# Patient Record
Sex: Female | Born: 1935 | Race: White | Hispanic: No | Marital: Married | Smoking: Never smoker
Health system: Southern US, Community
[De-identification: ages and names within clinical notes are randomized; demographics above are authoritative.]

## PROBLEM LIST (undated history)

## (undated) DIAGNOSIS — I350 Nonrheumatic aortic (valve) stenosis: Secondary | ICD-10-CM

## (undated) DIAGNOSIS — R5381 Other malaise: Secondary | ICD-10-CM

## (undated) DIAGNOSIS — E46 Unspecified protein-calorie malnutrition: Secondary | ICD-10-CM

## (undated) DIAGNOSIS — K5790 Diverticulosis of intestine, part unspecified, without perforation or abscess without bleeding: Secondary | ICD-10-CM

## (undated) DIAGNOSIS — N200 Calculus of kidney: Secondary | ICD-10-CM

## (undated) DIAGNOSIS — G459 Transient cerebral ischemic attack, unspecified: Secondary | ICD-10-CM

## (undated) DIAGNOSIS — I712 Thoracic aortic aneurysm, without rupture, unspecified: Secondary | ICD-10-CM

## (undated) DIAGNOSIS — I639 Cerebral infarction, unspecified: Secondary | ICD-10-CM

## (undated) DIAGNOSIS — I10 Essential (primary) hypertension: Secondary | ICD-10-CM

## (undated) DIAGNOSIS — C4491 Basal cell carcinoma of skin, unspecified: Secondary | ICD-10-CM

## (undated) DIAGNOSIS — J449 Chronic obstructive pulmonary disease, unspecified: Secondary | ICD-10-CM

## (undated) DIAGNOSIS — H532 Diplopia: Secondary | ICD-10-CM

## (undated) DIAGNOSIS — D519 Vitamin B12 deficiency anemia, unspecified: Secondary | ICD-10-CM

## (undated) DIAGNOSIS — I5022 Chronic systolic (congestive) heart failure: Secondary | ICD-10-CM

## (undated) DIAGNOSIS — K279 Peptic ulcer, site unspecified, unspecified as acute or chronic, without hemorrhage or perforation: Secondary | ICD-10-CM

## (undated) HISTORY — DX: Chronic obstructive pulmonary disease, unspecified: J44.9

## (undated) HISTORY — DX: Thoracic aortic aneurysm, without rupture, unspecified: I71.20

## (undated) HISTORY — DX: Calculus of kidney: N20.0

## (undated) HISTORY — PX: PARTIAL HYSTERECTOMY: SHX80

## (undated) HISTORY — PX: TUBAL LIGATION: SHX77

## (undated) HISTORY — DX: Basal cell carcinoma of skin, unspecified: C44.91

## (undated) HISTORY — DX: Cerebral infarction, unspecified: I63.9

## (undated) HISTORY — DX: Diverticulosis of intestine, part unspecified, without perforation or abscess without bleeding: K57.90

## (undated) HISTORY — DX: Vitamin B12 deficiency anemia, unspecified: D51.9

## (undated) HISTORY — PX: BASAL CELL CARCINOMA EXCISION: SHX1214

## (undated) HISTORY — DX: Essential (primary) hypertension: I10

## (undated) HISTORY — DX: Transient cerebral ischemic attack, unspecified: G45.9

## (undated) HISTORY — DX: Thoracic aortic aneurysm, without rupture: I71.2

## (undated) HISTORY — PX: HERNIA REPAIR: SHX51

## (undated) HISTORY — DX: Peptic ulcer, site unspecified, unspecified as acute or chronic, without hemorrhage or perforation: K27.9

## (undated) HISTORY — DX: Diplopia: H53.2

---

## 1966-10-17 HISTORY — PX: STOMACH SURGERY: SHX791

## 1984-10-17 HISTORY — PX: BACK SURGERY: SHX140

## 1986-10-17 DIAGNOSIS — K5730 Diverticulosis of large intestine without perforation or abscess without bleeding: Secondary | ICD-10-CM | POA: Insufficient documentation

## 1996-04-15 ENCOUNTER — Encounter (INDEPENDENT_AMBULATORY_CARE_PROVIDER_SITE_OTHER): Payer: Self-pay | Admitting: Internal Medicine

## 1996-04-15 LAB — CONVERTED CEMR LAB
RBC count: 4.06 10*6/uL
WBC, blood: 7.9 10*3/uL

## 1999-01-05 ENCOUNTER — Encounter (INDEPENDENT_AMBULATORY_CARE_PROVIDER_SITE_OTHER): Payer: Self-pay | Admitting: Internal Medicine

## 1999-01-05 LAB — CONVERTED CEMR LAB: RBC count: 3.59 10*6/uL

## 1999-01-11 ENCOUNTER — Encounter (INDEPENDENT_AMBULATORY_CARE_PROVIDER_SITE_OTHER): Payer: Self-pay | Admitting: Internal Medicine

## 1999-01-11 LAB — CONVERTED CEMR LAB: WBC, blood: 9.9 10*3/uL

## 1999-01-15 ENCOUNTER — Encounter (HOSPITAL_COMMUNITY): Admission: RE | Admit: 1999-01-15 | Discharge: 1999-04-15 | Payer: Self-pay | Admitting: Gastroenterology

## 1999-02-12 ENCOUNTER — Encounter (INDEPENDENT_AMBULATORY_CARE_PROVIDER_SITE_OTHER): Payer: Self-pay | Admitting: Internal Medicine

## 2000-08-29 ENCOUNTER — Encounter: Payer: Self-pay | Admitting: Internal Medicine

## 2000-08-29 ENCOUNTER — Encounter: Admission: RE | Admit: 2000-08-29 | Discharge: 2000-08-29 | Payer: Self-pay | Admitting: Internal Medicine

## 2000-09-04 ENCOUNTER — Encounter (INDEPENDENT_AMBULATORY_CARE_PROVIDER_SITE_OTHER): Payer: Self-pay | Admitting: Internal Medicine

## 2000-09-04 LAB — CONVERTED CEMR LAB
RBC count: 3.7 10*6/uL
WBC, blood: 6.2 10*3/uL

## 2000-09-27 LAB — FECAL OCCULT BLOOD, GUAIAC: Fecal Occult Blood: NEGATIVE

## 2000-12-04 ENCOUNTER — Encounter (INDEPENDENT_AMBULATORY_CARE_PROVIDER_SITE_OTHER): Payer: Self-pay | Admitting: Internal Medicine

## 2000-12-04 LAB — CONVERTED CEMR LAB: RBC count: 4.39 10*6/uL

## 2001-01-24 ENCOUNTER — Encounter (INDEPENDENT_AMBULATORY_CARE_PROVIDER_SITE_OTHER): Payer: Self-pay | Admitting: Internal Medicine

## 2001-01-24 LAB — CONVERTED CEMR LAB
RBC count: 4.39 10*6/uL
WBC, blood: 6.9 10*3/uL

## 2001-02-08 ENCOUNTER — Encounter (INDEPENDENT_AMBULATORY_CARE_PROVIDER_SITE_OTHER): Payer: Self-pay | Admitting: Internal Medicine

## 2001-02-12 ENCOUNTER — Encounter (INDEPENDENT_AMBULATORY_CARE_PROVIDER_SITE_OTHER): Payer: Self-pay | Admitting: Internal Medicine

## 2001-02-12 LAB — CONVERTED CEMR LAB: WBC, blood: 9.1 10*3/uL

## 2002-02-27 ENCOUNTER — Encounter (INDEPENDENT_AMBULATORY_CARE_PROVIDER_SITE_OTHER): Payer: Self-pay | Admitting: Internal Medicine

## 2002-02-27 LAB — CONVERTED CEMR LAB: RBC count: 4.41 10*6/uL

## 2002-10-17 DIAGNOSIS — J449 Chronic obstructive pulmonary disease, unspecified: Secondary | ICD-10-CM

## 2002-10-17 DIAGNOSIS — J4489 Other specified chronic obstructive pulmonary disease: Secondary | ICD-10-CM | POA: Insufficient documentation

## 2002-10-21 ENCOUNTER — Inpatient Hospital Stay (HOSPITAL_COMMUNITY): Admission: EM | Admit: 2002-10-21 | Discharge: 2002-10-23 | Payer: Self-pay | Admitting: Emergency Medicine

## 2002-10-21 ENCOUNTER — Encounter: Payer: Self-pay | Admitting: *Deleted

## 2002-10-21 HISTORY — PX: OTHER SURGICAL HISTORY: SHX169

## 2002-10-22 ENCOUNTER — Encounter: Payer: Self-pay | Admitting: Cardiology

## 2002-10-23 ENCOUNTER — Encounter: Payer: Self-pay | Admitting: Cardiology

## 2003-11-24 ENCOUNTER — Encounter (INDEPENDENT_AMBULATORY_CARE_PROVIDER_SITE_OTHER): Payer: Self-pay | Admitting: Internal Medicine

## 2003-11-24 LAB — CONVERTED CEMR LAB
Blood Glucose, Fasting: 100 mg/dL
RBC count: 4.61 10*6/uL
WBC, blood: 7.6 10*3/uL

## 2004-12-13 ENCOUNTER — Ambulatory Visit: Payer: Self-pay | Admitting: Cardiology

## 2005-06-29 ENCOUNTER — Ambulatory Visit: Payer: Self-pay | Admitting: Cardiology

## 2005-12-21 ENCOUNTER — Ambulatory Visit: Payer: Self-pay | Admitting: Cardiology

## 2006-06-22 ENCOUNTER — Ambulatory Visit: Payer: Self-pay | Admitting: Cardiology

## 2006-07-04 ENCOUNTER — Ambulatory Visit: Payer: Self-pay | Admitting: Family Medicine

## 2006-07-04 ENCOUNTER — Encounter (INDEPENDENT_AMBULATORY_CARE_PROVIDER_SITE_OTHER): Payer: Self-pay | Admitting: Internal Medicine

## 2006-07-04 LAB — CONVERTED CEMR LAB
RBC count: 4.1 10*6/uL
WBC, blood: 7 10*3/uL

## 2006-08-15 ENCOUNTER — Ambulatory Visit: Payer: Self-pay | Admitting: Family Medicine

## 2006-09-08 ENCOUNTER — Ambulatory Visit: Payer: Self-pay | Admitting: Family Medicine

## 2006-09-21 ENCOUNTER — Ambulatory Visit: Payer: Self-pay | Admitting: Family Medicine

## 2006-12-20 ENCOUNTER — Ambulatory Visit: Payer: Self-pay | Admitting: Cardiology

## 2007-05-25 ENCOUNTER — Ambulatory Visit: Payer: Self-pay | Admitting: Family Medicine

## 2007-05-25 ENCOUNTER — Observation Stay (HOSPITAL_COMMUNITY): Admission: EM | Admit: 2007-05-25 | Discharge: 2007-05-27 | Payer: Self-pay | Admitting: Emergency Medicine

## 2007-05-25 DIAGNOSIS — R0989 Other specified symptoms and signs involving the circulatory and respiratory systems: Secondary | ICD-10-CM

## 2007-05-25 DIAGNOSIS — H532 Diplopia: Secondary | ICD-10-CM

## 2007-05-26 ENCOUNTER — Encounter: Payer: Self-pay | Admitting: Internal Medicine

## 2007-05-27 ENCOUNTER — Encounter (INDEPENDENT_AMBULATORY_CARE_PROVIDER_SITE_OTHER): Payer: Self-pay | Admitting: Internal Medicine

## 2007-05-28 ENCOUNTER — Telehealth (INDEPENDENT_AMBULATORY_CARE_PROVIDER_SITE_OTHER): Payer: Self-pay | Admitting: Internal Medicine

## 2007-05-29 ENCOUNTER — Ambulatory Visit: Payer: Self-pay

## 2007-05-29 ENCOUNTER — Encounter (INDEPENDENT_AMBULATORY_CARE_PROVIDER_SITE_OTHER): Payer: Self-pay | Admitting: Internal Medicine

## 2007-06-01 ENCOUNTER — Ambulatory Visit: Payer: Self-pay | Admitting: Family Medicine

## 2007-06-01 DIAGNOSIS — G459 Transient cerebral ischemic attack, unspecified: Secondary | ICD-10-CM

## 2007-06-01 HISTORY — DX: Transient cerebral ischemic attack, unspecified: G45.9

## 2007-07-06 ENCOUNTER — Ambulatory Visit: Payer: Self-pay | Admitting: Cardiology

## 2008-07-16 ENCOUNTER — Ambulatory Visit: Payer: Self-pay | Admitting: Cardiology

## 2008-09-29 ENCOUNTER — Telehealth: Payer: Self-pay | Admitting: Family Medicine

## 2008-09-29 ENCOUNTER — Ambulatory Visit: Payer: Self-pay | Admitting: Family Medicine

## 2008-09-29 DIAGNOSIS — L03319 Cellulitis of trunk, unspecified: Secondary | ICD-10-CM

## 2008-09-29 DIAGNOSIS — L02219 Cutaneous abscess of trunk, unspecified: Secondary | ICD-10-CM

## 2008-09-30 ENCOUNTER — Encounter: Payer: Self-pay | Admitting: Family Medicine

## 2008-10-01 ENCOUNTER — Ambulatory Visit: Payer: Self-pay | Admitting: Family Medicine

## 2008-10-17 DIAGNOSIS — G459 Transient cerebral ischemic attack, unspecified: Secondary | ICD-10-CM

## 2008-10-17 HISTORY — DX: Transient cerebral ischemic attack, unspecified: G45.9

## 2008-11-04 ENCOUNTER — Ambulatory Visit: Payer: Self-pay | Admitting: Internal Medicine

## 2008-11-04 DIAGNOSIS — D518 Other vitamin B12 deficiency anemias: Secondary | ICD-10-CM

## 2008-11-04 DIAGNOSIS — R42 Dizziness and giddiness: Secondary | ICD-10-CM

## 2008-11-04 DIAGNOSIS — D509 Iron deficiency anemia, unspecified: Secondary | ICD-10-CM | POA: Insufficient documentation

## 2008-11-07 ENCOUNTER — Ambulatory Visit: Payer: Self-pay | Admitting: Oncology

## 2008-11-07 ENCOUNTER — Encounter: Payer: Self-pay | Admitting: Internal Medicine

## 2008-11-07 ENCOUNTER — Telehealth (INDEPENDENT_AMBULATORY_CARE_PROVIDER_SITE_OTHER): Payer: Self-pay | Admitting: Internal Medicine

## 2008-11-07 DIAGNOSIS — Z87442 Personal history of urinary calculi: Secondary | ICD-10-CM

## 2008-11-07 DIAGNOSIS — Z85828 Personal history of other malignant neoplasm of skin: Secondary | ICD-10-CM | POA: Insufficient documentation

## 2008-11-07 LAB — CONVERTED CEMR LAB
Albumin: 3.1 g/dL — ABNORMAL LOW (ref 3.5–5.2)
Alkaline Phosphatase: 54 units/L (ref 39–117)
Basophils Absolute: 0 10*3/uL (ref 0.0–0.1)
Bilirubin, Direct: 0.1 mg/dL (ref 0.0–0.3)
Cholesterol: 182 mg/dL (ref 0–200)
Folate: 6.7 ng/mL
HDL: 53.4 mg/dL (ref >39.0)
LDL Cholesterol: 108 mg/dL — ABNORMAL HIGH (ref 0–99)
Lymphocytes Relative: 14.7 % (ref 12.0–46.0)
MCHC: 33.2 g/dL (ref 30.0–36.0)
Monocytes Relative: 7.7 % (ref 3.0–12.0)
Platelets: 250 10*3/uL (ref 150–400)
RDW: 18.1 % — ABNORMAL HIGH (ref 11.5–14.6)
Total CHOL/HDL Ratio: 3.4
Total Protein: 6.4 g/dL (ref 6.0–8.3)
Triglycerides: 104 mg/dL (ref 0–149)
VLDL: 21 mg/dL (ref 0–40)
Vitamin B-12: 1198 pg/mL — ABNORMAL HIGH (ref 211–911)

## 2008-11-12 ENCOUNTER — Encounter: Payer: Self-pay | Admitting: Family Medicine

## 2008-11-12 LAB — CBC WITH DIFFERENTIAL (CANCER CENTER ONLY)
BASO#: 0 10*3/uL (ref 0.0–0.2)
EOS%: 2.9 % (ref 0.0–7.0)
HCT: 34.6 % — ABNORMAL LOW (ref 34.8–46.6)
HGB: 11.1 g/dL — ABNORMAL LOW (ref 11.6–15.9)
LYMPH#: 1.1 10*3/uL (ref 0.9–3.3)
LYMPH%: 16 % (ref 14.0–48.0)
MCH: 26.3 pg (ref 26.0–34.0)
MCHC: 32.2 g/dL (ref 32.0–36.0)
MCV: 82 fL (ref 81–101)
MONO%: 4.1 % (ref 0.0–13.0)
NEUT%: 76.4 % (ref 39.6–80.0)

## 2008-11-12 LAB — CMP (CANCER CENTER ONLY)
ALT(SGPT): 10 U/L (ref 10–47)
CO2: 29 mEq/L (ref 18–33)
Calcium: 9.2 mg/dL (ref 8.0–10.3)
Chloride: 98 mEq/L (ref 98–108)
Glucose, Bld: 113 mg/dL (ref 73–118)
Sodium: 143 mEq/L (ref 128–145)
Total Protein: 7.5 g/dL (ref 6.4–8.1)

## 2008-11-12 LAB — MORPHOLOGY - CHCC SATELLITE: Platelet Morphology: NORMAL

## 2008-11-12 LAB — TECHNOLOGIST REVIEW CHCC SATELLITE

## 2008-11-14 LAB — PROTEIN ELECTROPHORESIS, SERUM
Beta 2: 5.9 % (ref 3.2–6.5)
Gamma Globulin: 13.8 % (ref 11.1–18.8)

## 2008-11-14 LAB — IRON AND TIBC
%SAT: 8 % — ABNORMAL LOW (ref 20–55)
TIBC: 403 ug/dL (ref 250–470)
UIBC: 369 ug/dL

## 2008-11-14 LAB — FOLATE: Folate: 7.6 ng/mL

## 2008-11-14 LAB — ERYTHROPOIETIN: Erythropoietin: 44.7 m[IU]/mL — ABNORMAL HIGH (ref 2.6–34.0)

## 2008-12-02 ENCOUNTER — Encounter (INDEPENDENT_AMBULATORY_CARE_PROVIDER_SITE_OTHER): Payer: Self-pay | Admitting: Internal Medicine

## 2008-12-02 LAB — CBC WITH DIFFERENTIAL (CANCER CENTER ONLY)
BASO#: 0 10*3/uL (ref 0.0–0.2)
Eosinophils Absolute: 0.3 10*3/uL (ref 0.0–0.5)
HGB: 10.4 g/dL — ABNORMAL LOW (ref 11.6–15.9)
LYMPH#: 1.3 10*3/uL (ref 0.9–3.3)
MCH: 26.8 pg (ref 26.0–34.0)
MONO#: 0.4 10*3/uL (ref 0.1–0.9)
NEUT#: 4.1 10*3/uL (ref 1.5–6.5)
RBC: 3.89 10*6/uL (ref 3.70–5.32)
WBC: 6.1 10*3/uL (ref 3.9–10.0)

## 2008-12-18 ENCOUNTER — Ambulatory Visit: Payer: Self-pay | Admitting: Oncology

## 2009-01-12 DIAGNOSIS — I10 Essential (primary) hypertension: Secondary | ICD-10-CM

## 2009-01-12 DIAGNOSIS — I6350 Cerebral infarction due to unspecified occlusion or stenosis of unspecified cerebral artery: Secondary | ICD-10-CM | POA: Insufficient documentation

## 2009-01-12 DIAGNOSIS — I712 Thoracic aortic aneurysm, without rupture, unspecified: Secondary | ICD-10-CM | POA: Insufficient documentation

## 2009-01-13 ENCOUNTER — Encounter: Payer: Self-pay | Admitting: Family Medicine

## 2009-01-13 LAB — CBC WITH DIFFERENTIAL (CANCER CENTER ONLY)
BASO#: 0 10*3/uL (ref 0.0–0.2)
Eosinophils Absolute: 0.2 10*3/uL (ref 0.0–0.5)
HCT: 41.4 % (ref 34.8–46.6)
HGB: 13.7 g/dL (ref 11.6–15.9)
LYMPH#: 1.1 10*3/uL (ref 0.9–3.3)
LYMPH%: 21 % (ref 14.0–48.0)
MCV: 89 fL (ref 81–101)
MONO#: 0.2 10*3/uL (ref 0.1–0.9)
NEUT%: 71 % (ref 39.6–80.0)
WBC: 5 10*3/uL (ref 3.9–10.0)

## 2009-01-13 LAB — IRON AND TIBC
%SAT: 27 % (ref 20–55)
TIBC: 283 ug/dL (ref 250–470)
UIBC: 208 ug/dL

## 2009-04-13 ENCOUNTER — Ambulatory Visit: Payer: Self-pay | Admitting: Oncology

## 2009-04-15 ENCOUNTER — Encounter (INDEPENDENT_AMBULATORY_CARE_PROVIDER_SITE_OTHER): Payer: Self-pay | Admitting: Internal Medicine

## 2009-04-15 LAB — CBC WITH DIFFERENTIAL (CANCER CENTER ONLY)
BASO#: 0 10*3/uL (ref 0.0–0.2)
BASO%: 0.4 % (ref 0.0–2.0)
EOS%: 5 % (ref 0.0–7.0)
HGB: 13.3 g/dL (ref 11.6–15.9)
LYMPH#: 1 10*3/uL (ref 0.9–3.3)
MCHC: 35 g/dL (ref 32.0–36.0)
NEUT#: 3.7 10*3/uL (ref 1.5–6.5)

## 2009-04-15 LAB — IRON AND TIBC: Iron: 58 ug/dL (ref 42–145)

## 2009-04-15 LAB — FERRITIN: Ferritin: 171 ng/mL (ref 10–291)

## 2009-07-23 ENCOUNTER — Ambulatory Visit: Payer: Self-pay | Admitting: Cardiology

## 2009-07-24 ENCOUNTER — Encounter (INDEPENDENT_AMBULATORY_CARE_PROVIDER_SITE_OTHER): Payer: Self-pay | Admitting: *Deleted

## 2009-08-12 ENCOUNTER — Ambulatory Visit: Payer: Self-pay | Admitting: Oncology

## 2009-08-14 ENCOUNTER — Telehealth: Payer: Self-pay | Admitting: Cardiology

## 2009-08-17 ENCOUNTER — Encounter: Payer: Self-pay | Admitting: Family Medicine

## 2009-08-17 LAB — CBC WITH DIFFERENTIAL (CANCER CENTER ONLY)
BASO%: 0.3 % (ref 0.0–2.0)
EOS%: 4.8 % (ref 0.0–7.0)
HCT: 40.6 % (ref 34.8–46.6)
LYMPH#: 1.2 10*3/uL (ref 0.9–3.3)
MCHC: 33 g/dL (ref 32.0–36.0)
NEUT#: 3.5 10*3/uL (ref 1.5–6.5)
NEUT%: 66.8 % (ref 39.6–80.0)
RDW: 12.1 % (ref 10.5–14.6)

## 2009-08-18 LAB — IRON AND TIBC
%SAT: 25 % (ref 20–55)
TIBC: 249 ug/dL — ABNORMAL LOW (ref 250–470)
UIBC: 186 ug/dL

## 2009-08-21 ENCOUNTER — Encounter: Payer: Self-pay | Admitting: Family Medicine

## 2009-11-13 ENCOUNTER — Ambulatory Visit: Payer: Self-pay | Admitting: Oncology

## 2009-11-19 ENCOUNTER — Encounter: Payer: Self-pay | Admitting: Family Medicine

## 2009-11-19 LAB — CBC WITH DIFFERENTIAL (CANCER CENTER ONLY)
BASO%: 0.5 % (ref 0.0–2.0)
EOS%: 3 % (ref 0.0–7.0)
HCT: 39.3 % (ref 34.8–46.6)
LYMPH%: 15.4 % (ref 14.0–48.0)
MCH: 32 pg (ref 26.0–34.0)
MCHC: 33.2 g/dL (ref 32.0–36.0)
MCV: 96 fL (ref 81–101)
MONO#: 0.3 10*3/uL (ref 0.1–0.9)
MONO%: 5.5 % (ref 0.0–13.0)
NEUT%: 75.6 % (ref 39.6–80.0)
Platelets: 228 10*3/uL (ref 145–400)
RDW: 11.1 % (ref 10.5–14.6)

## 2009-11-19 LAB — FERRITIN: Ferritin: 166 ng/mL (ref 10–291)

## 2009-11-19 LAB — IRON AND TIBC: TIBC: 268 ug/dL (ref 250–470)

## 2009-11-20 ENCOUNTER — Telehealth: Payer: Self-pay | Admitting: Family Medicine

## 2010-07-30 ENCOUNTER — Ambulatory Visit: Payer: Self-pay | Admitting: Cardiology

## 2010-08-05 ENCOUNTER — Telehealth (INDEPENDENT_AMBULATORY_CARE_PROVIDER_SITE_OTHER): Payer: Self-pay | Admitting: *Deleted

## 2010-08-05 ENCOUNTER — Encounter: Payer: Self-pay | Admitting: Family Medicine

## 2010-11-12 ENCOUNTER — Telehealth: Payer: Self-pay | Admitting: Cardiology

## 2010-11-16 NOTE — Letter (Signed)
[facility]   Imported By: Lanelle Bal 11/30/2009 11:55:19  _____________________________________________________________________  External Attachment:    Type:   Image     Comment:   External Document

## 2010-11-16 NOTE — Miscellaneous (Signed)
Summary: flu vaccine  Clinical Lists Changes  Observations: Added new observation of FLU VAX: Historical at [facility] (07/29/2010 16:12)      Immunization History:  Influenza Immunization History:    Influenza:  historical at [facility] (07/29/2010)

## 2010-11-16 NOTE — Assessment & Plan Note (Signed)
Summary: [name] [name]   Visit Type:  1 year follow up  CC:  No complaints.  History of Present Illness: Patient is making quilts for the grandchildren.  Staying activity.  She denies any chest pain.  Not having any chest pain.  No shortness of breath.  She has not had a recent labwork.  She is comfortable with her decision making.  No real problems at the present time.    Current Medications (verified): 1)  Cyanocobalamin 1000 Mcg/ml Soln (Cyanocobalamin) .... 1,000 Micrograms Per Month 2)  Acetaminophen 325 Mg  Tabs (Acetaminophen) .... Take 1 Tablet By Mouth Once A Day As Needed 3)  Meclizine Hcl 25 Mg Tabs (Meclizine Hcl) .... Take One By Mouth As Needed 4)  Aspirin 81 Mg Tbec (Aspirin) .... Take Two Tablets By Mouth Daily 5)  Metoprolol Tartrate 50 Mg Tabs (Metoprolol Tartrate) .... Take 1/2 Tablet Two Times A Day  Allergies (verified): No Known Drug Allergies  Past History:  Past Medical History: Last updated: 01/12/2009 THORACIC AORTIC ANEURYSM (ICD-441.2) CVA (ICD-434.91) HYPERTENSION, UNSPECIFIED (ICD-401.9) DIVERTICULAR DISEASE (ICD-562.10) COPD (ICD-496) AORTIC ANEURYSM (ICD-441.9) CARCINOMA, BASAL CELL, HX OF (ICD-V10.83) RENAL CALCULUS, HX OF (ICD-V13.01) ANEMIA (ICD-285.9) DIZZINESS (ICD-780.4) ANEMIA, B12 DEFICIENCY (ICD-281.1) CELLULITIS AND ABSCESS OF TRUNK (ICD-682.2) TIA (ICD-435.9) CAROTID BRUIT, RIGHT (ICD-785.9) DIPLOPIA (ICD-368.2)    Past Surgical History: Last updated: 01/12/2009 2-D echo 8/08 STOMACH SURGERY X2 BILLROTH II--1968 ;VAGOTOMYSUBTOTAL GASTRIC//GASTRECTOMY:(1989) BACK SURGERY :(1986) HERNIA REPAIR tubal ligation PARTIAL HYSTERECTOMY FOR FIBROIDS Echo--mild LVD--EF 40-50% CHEST CT ASCENDING ANEURYSM ; 5.5 X 5.5 :(10/21/2002) UPPER GI WITH SMALL BOWEL FOLLOW THROUGH:(08/2000) CAROTID DOPPLER- NEGATIVE:(12/1998)  basal skin cancer removed  Family History: Last updated: 01/12/2009 Father: FATHER CVA AT AGE 67 Mother: died at age 37 of  cholecystitis Siblings:  MATERNAL AUNT FACIAL CANCER  CV: NEGATIVE HBP: NEGATIVE MI: NEGATIVE  Social History: Last updated: 11/07/2008 Retired Alcohol use-no Drug use-no Married and lives with her husband  Vital Signs:  Patient profile:   77 year old female Height:      63 inches Weight:      105.50 pounds BMI:     18.76 Pulse rate:   63 / minute Pulse rhythm:   regular Resp:     18 per minute BP sitting:   124 / 74  (left arm) Cuff size:   regular  Vitals Entered By: Vikki Ports (July 30, 2010 9:27 AM)  Physical Exam  General:  Well developed, well nourished, in no acute distress. Head:  normocephalic and atraumatic Eyes:  PERRLA/EOM intact; conjunctiva and lids normal. Lungs:  Clear bilaterally to auscultation and percussion. Heart:  PMI non displaced.  Normal S1 and S2.  SEM 2/6, 1/6 early diastolic blow.   Abdomen:  Bowel sounds positive; abdomen soft and non-tender without masses, organomegaly, or hernias noted. No hepatosplenomegaly. Pulses:  pulses normal in all 4 extremities Extremities:  No clubbing or cyanosis. Neurologic:  Alert and oriented x 3.   EKG  Procedure date:  07/30/2010  Findings:      NSR.  LVH.  No repole changes.   Impression & Recommendations:  Problem # 1:  THORACIC AORTIC ANEURYSM (ICD-441.2) BP remains stable. She has no interest in evaluation or treatment other than BP control. This has been discussed many times  Problem # 2:  HYPERTENSION, UNSPECIFIED (ICD-401.9)  controlled at present.  Her updated medication list for this problem includes:    Aspirin 81 Mg Tbec (Aspirin) .Marland Kitchen... Take two tablets by mouth daily    Metoprolol Tartrate  50 Mg Tabs (Metoprolol tartrate) .Marland Kitchen... Take 1/2 tablet two times a day  Her updated medication list for this problem includes:    Aspirin 81 Mg Tbec (Aspirin) .Marland Kitchen... Take two tablets by mouth daily    Metoprolol Tartrate 50 Mg Tabs (Metoprolol tartrate) .Marland Kitchen... Take 1/2 tablet two times a  day  Orders: EKG w/ Interpretation (93000)  Problem # 3:  ANEMIA (ICD-285.9) managed at [facility] clinic.  Patient Instructions: 1)  Your physician recommends that you continue on your current medications as directed. Please refer to the Current Medication list given to you today. 2)  Your physician wants you to follow-up in: 1 YEAR.  You will receive a reminder letter in the mail two months in advance. If you don't receive a letter, please call our office to schedule the follow-up appointment. Prescriptions: METOPROLOL TARTRATE 50 MG TABS (METOPROLOL TARTRATE) take 1/2 tablet two times a day  #90 x 3   Entered by:   Julieta Gutting, RN, BSN   Authorized by:   Ronaldo Miyamoto, MD, [facility]   Signed by:   Julieta Gutting, RN, BSN on 07/30/2010   Method used:   Electronically to        [facility] [address] (retail)       [address], [address]       [place], [place]  [address]       Ph: [phone]       Fax: [phone]   RxID:   [id number]

## 2010-11-16 NOTE — Progress Notes (Signed)
Summary: refill request/wants nurse call  Phone Note Refill Request Message from:  Patient on August 05, 2010 10:44 AM  Refills Requested: Medication #1:  METOPROLOL TARTRATE 50 MG TABS take 1/2 tablet two times a day. [facility] [address] in [place]/pt would like a call from the nurse as well-has questions   Method Requested: Telephone to Pharmacy Initial call taken by: Glynda Jaeger,  August 05, 2010 10:45 AM  Follow-up for Phone Call        Rx faxed to pharmacy. Vikki Ports  August 05, 2010 11:24 AM       Prescriptions: METOPROLOL TARTRATE 50 MG TABS (METOPROLOL TARTRATE) take 1/2 tablet two times a day  #90 x 3   Entered by:   Vikki Ports   Authorized by:   Ronaldo Miyamoto, MD, [facility]   Signed by:   Vikki Ports on 08/05/2010   Method used:   Faxed to ...       [facility] [address] (retail)       [address], [address]       [place], [place]  [address]       Ph: [phone]       Fax: [phone]   RxID:   [id number]   Appended Document: refill request/wants nurse call I spoke with the pharmacy and they did not receive Rx sent on 07/30/10.  Doree Fudge recent prescription this morning.  I called the pharmacy and they did receive Rx from today and have already filled Metoprolol.  I made the pt aware that medication is ready for pick-up.

## 2010-11-16 NOTE — Progress Notes (Signed)
Summary: Cyanocobalan refill  Phone Note Refill Request Call back at [phone] Message from:  Barrie Folk rd on November 20, 2009 5:04 PM  Refills Requested: Medication #1:  CYANOCOBALAMIN 1000 MCG/ML SOLN 1   Last Refilled: 11/06/2008 Refill request not filled since 11/06/08? Last seen on 11/04/08 by Everrett Coombe. Please advise.    Method Requested: Electronic Initial call taken by: Lewanda Rife LPN,  November 20, 2009 5:05 PM  Follow-up for Phone Call        Please have her scheduled a yearly CPX. Refill until appt date.  Follow-up by: Kerby Nora MD,  November 20, 2009 5:16 PM  Additional Follow-up for Phone Call Additional follow up Details #1::        Pt scheduled yearly CPX on 01/26/10 at 2:45pm and refill given on Vit B12 x3 as instructed. Lewanda Rife LPN  November 23, 2009 8:45 AM     Prescriptions: CYANOCOBALAMIN 1000 MCG/ML SOLN (CYANOCOBALAMIN) 1,000 micrograms per month  #58ml x 3   Entered by:   Lewanda Rife LPN   Authorized by:   Kerby Nora MD   Signed by:   Lewanda Rife LPN on 25/36/6440   Method used:   Electronically to        [facility] [address] (retail)       [address]       [place], [place]  [address]       Ph: [phone]       Fax: [phone]   RxID:   [id number]

## 2010-11-18 NOTE — Progress Notes (Signed)
Summary: Cataracts  Phone Note Call from Patient Call back at [phone]   Caller: Daughter/BETH Reason for Call: Talk to Nurse Summary of Call: PT DAUGHTER HAS QUESTION RE PT CONDITION. PT IS GOING TO HAVE SURGERY PT DAUGHTER HAS QUESTION RE PT HEART CONDITION. Initial call taken by: Roe Coombs,  November 12, 2010 11:26 AM  Follow-up for Phone Call        I spoke with the pt's daughter and she saw the optometrist at [facility] and they told her that she had cataracts.  They told her to contact [facility] to schedule appt.  I made Beth aware that the pt would need to see [facility] first and then let them know that Dr Riley Kill is her Cardiologist and they will request clearance.  Follow-up by: Julieta Gutting, RN, BSN,  November 12, 2010 3:49 PM

## 2011-03-01 NOTE — H&P (Signed)
NAME:  [name] [name], KASSING NO.:  [id number]   MEDICAL RECORD NO.:  [id number]          PATIENT TYPE:  EMS   LOCATION:  MAJO                         FACILITY:  [facility]   PHYSICIAN:  Thora Lance, M.D.  DATE OF BIRTH:  Jul 13, 1926   DATE OF ADMISSION:  05/25/2007  DATE OF DISCHARGE:                              HISTORY & PHYSICAL   CHIEF COMPLAINT:  Double vision.   HISTORY OF PRESENT ILLNESS:  This is a 77 year old white female with a  past medical history of mild LV dysfunction, kidney stones, and an  ascending thoracic aortic aneurysm who presents complaining that this  morning she woke up with double vision.  She reports that things that  are at a distance are seen double.  She has had a mild frontal headache.  She says she was a little bit off balance or weaving with her gait  today.  She denies any speech changes, or weakness or numbness in the  extremity.  She saw her ophthalmologist today who could not find any  problem with her eyes.  She then went to her primary care office at  [facility] and was sent to the ER for a CAT scan, which was  done and it showed some old lacunar infarcts in her internal capsules  bilaterally but no acute findings.   PAST MEDICAL HISTORY:  1. Mild LV dysfunction.  A 2D echocardiogram in 2004, EF 40-50% with      mild diffuse LV hypokinesis.  2. Ascending thoracic aortic aneurysm, 5.5 x 5.5-cm CAT scan in 2004.  3. PACs and PVCs.  4. B12 deficiency.  5. History of vertigo.  6. Renal stones.   SURGICAL:  1. Partial gastrectomy in 1968.  2. Back surgery.  3. Tonsillectomy and adenoidectomy.  4. BTL.   ALLERGIES:  No known drug allergies.   CURRENT MEDICATIONS:  Metoprolol 50 mg, one half b.i.d.   FAMILY HISTORY:  Father had a stroke late in life.   SOCIAL HISTORY:  Married, lives with husband, 2 daughters and 2 sons.  She does not smoke or drink alcohol.   PHYSICAL EXAMINATION:  GENERAL:  A very pleasant,  alert, elderly female.  VITAL SIGNS:  Blood pressure per ER records.   LABORATORY:  CBC:  WBC 7, hemoglobin 12, platelet count 284.  Creatinine  0.9, sodium 140, potassium 4.5, chloride 107, glucose 106, BUN 17,  bicarbonate 26.  EKG shows a normal sinus rhythm with a marked sinus  arrhythmia, nonspecific ST abnormality.  A CT scan of the head shows  bilateral internal capsule old lacunar infarcts, nothing acute.  Chest x-  ray is pending.   ASSESSMENT:  1. Diplopia, possible mild gait disturbance rule out cerebrovascular      accident.  2. Mild left ventricular dysfunction.  3. History of ascending thoracic aortic aneurysm.   PLAN:  1. Observation overnight.  2. MRI/MRA in the morning.  3. IV normal saline.  4. ASA.           ______________________________  Thora Lance, M.D.     Delorse Limber  D:  05/25/2007  T:  05/25/2007  Job:  [id number]

## 2011-03-01 NOTE — Assessment & Plan Note (Signed)
[facility]                            CARDIOLOGY OFFICE NOTE   NAME:[name] [name], [name] [name]                      MRN:          [id number]  DATE:07/06/2007                            DOB:          [birth year]    [name] [name] is in for followup.  She was admitted to the hospital with  diplopia and possible mild gait disturbance.  She has a history of left  ventricular dysfunction and ascending thoracic aneurysm for which she  virtually wants no treatment.  When she was admitted to the hospital, an MRI suggested some small  strokes.  They recommended switching her Plavix, but she felt poorly on  this and she went back on aspirin.  This is what she wants to take.  Her echocardiogram was done and demonstrated thoracic enlargement.  Her  ejection fraction was thought to be 55% inadequate for left ventricular  regional wall motion, mild to moderately increased LV thickness.  There  is mildly reduced aortic leaflet excursion and fusion of the left and  right coronary cusps.  There was thought to be mild aortic stenosis.   She has felt well since discharge from the hospital.   PHYSICAL EXAMINATION:  VITAL SIGNS:  The blood pressure is 120/76, pulse  is 66.  HEART:  There is a soft systolic ejection murmur, no diastolic murmurs  are appreciated.  EXTREMITIES:  Reveal no edema.   The EKG reveals sinus bradycardia.  There is possible left atrial  enlargement and septal infarct which is probably not reliable.  There is  an atrial premature beat.   IMPRESSION:  1. Ascending thoracic aneurysm for which the patient wants no      treatment.  2. Recent cerebrovascular accident for which she was placed on Plavix,      she stopped and resumed aspirin therapy.  3. Hypertension on beta blockade.   PLAN/DISPOSITION:  Return to clinic in 6 months.  No new treatment.  Follow up at [facility].     Arturo Morton. Riley Kill, MD, [facility]  Electronically Signed    TDS/MedQ  DD:  07/06/2007  DT: 07/06/2007  Job #: [id number]   cc:   Billie D. Bean, FNP

## 2011-03-01 NOTE — Discharge Summary (Signed)
NAME:  [name] [name], [name] [name]               ACCOUNT NO.:  [id number]   MEDICAL RECORD NO.:  [id number]          PATIENT TYPE:  INP   LOCATION:  6732                         FACILITY:  [facility]   PHYSICIAN:  Titus Dubin. Hopper, MD,FACP,FCCPDATE OF BIRTH:  [birth year]   DATE OF ADMISSION:  05/25/2007  DATE OF DISCHARGE:  05/27/2007                               DISCHARGE SUMMARY   ADMITTING DIAGNOSES:  1. Diplopia.  2. Possible mild gait disturbance.  3. History of left ventricular dysfunction.  4. History of ascending thoracic aneurysm.   DISCHARGE DIAGNOSES:  1. Diplopia, horizontal, rule out transient ischemic attack.  2. Remote basal ganglion nonhemorrhagic infarcts with right cerebellar      peduncle minute hemorrhage without acute infarct.  3. 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  4. Anemia, stable.  5. Past history of gastrointestinal ulcers, status post surgery.   BRIEF HISTORY:  [name] [name] is a 77 year old white female who noted  diplopia upon awakening.  This was associated with mild frontal  headache.  She noticed some balance dysfunction with weaving while  walking.  There were no other associated neurologic findings.  She saw  her ophthalmologist who found no primary neuro-ophthalamic process.  She  was sent for CT which suggested old lacunar infarcts without acute  process.   The lab studies revealed initial hematocrit was 39; followup was 33.5.  Because of this, she had serial hematocrits ,checked every 8 hours.  These initially were 37.4 followed by values of 34.6 and 34.4,  indicating a stable anemia.  She had no GI symptoms associated with  this.   MRI showed the findings noted above.  She was seen in consultation by  Dr. Sharene Skeans who felt there was no acute process.  He did recommend  holding any aspirin and placing her on Plavix 75 mg daily.  He also  recommended a  hemoglobin A1c and homocysteine which are pending at the  time of discharge.  Serum lipids were excellent, with total cholesterol  of 163, triglycerides 78, HDL 48, and LDL 99.  Albumin was mildly low at  2.8, indicating suboptimal nutrition.  A 2-D echocardiogram was also  requested by neurology but will require completion as an outpatient;  this will be ordered by Ms. Bean.   On exam on the day of discharge, she was asymptomatic with no neurologic  symptoms.  Strength was good in all extremities.  Blood pressure was  120/69, respiratory rate 20, pulse 70 and regular.  O2 saturations were  95% on room air.  She did exhibit a grade 1 to 1.5 harsh murmur at the  right base; as noted, echocardiogram will be completed.   Other labs revealed included a glucose of 106; A1c is pending.  Other  than the albumin of 2.8 and total protein of 5.8, her comprehensive  metabolic profile was normal.  GFR was over 60.  BNP was mildly elevated  at 192, but she had no evidence clinically of any heart failure.  Her discharge status is improved in that the possible TIA symptoms have  resolved.  Prognosis depends on any process found at 2-D  echocardiogram.   Her diet will be unchanged.  There was no change in her metoprolol or  vitamins.  She was to stop her aspirin.  She was given a prescription  for 1 month of Plavix; it can be continued by Ms. Bean depending on her  subsequent followup.  She has been asked to contact Ms. Bean's office on  May 28, 2007 to schedule the 2-D echocardiogram and have a followup  after those results have been received.      Titus Dubin. Alwyn Ren, MD,FACP,FCCP  Electronically Signed     [facility]/MEDQ  D:  05/27/2007  T:  05/28/2007  Job:  [id number]   cc:   Willaim Sheng D. Bean, FNP  Arta Silence, MD  Deanna Artis. Sharene Skeans, M.D.

## 2011-03-01 NOTE — Assessment & Plan Note (Signed)
[facility]                            CARDIOLOGY OFFICE NOTE   NAME:[name], [name] MANANSALA                      MRN:          [id number]  DATE:07/16/2008                            DOB:          1926/08/20    [name] [name] is in for followup.  She is really doing quite well.  She has  not been having any ongoing symptoms.  The patient has had 5.5 cm known  ascending aortic aneurysm, and she has had a long desire for no surgery.  She has a B12 anemia for which she is on monthly injections.  She also  has mildly elevated blood pressure, but this has been well controlled on  medical regimen.  She has desired not to have any specific surgery done.   MEDICATIONS:  1. B12 injection monthly.  2. Metoprolol 50 mg one half tablet p.o. b.i.d.  3. Enteric-coated aspirin 325 mg daily.   PHYSICAL EXAMINATION:  GENERAL:  She is alert and oriented.  No  distress.  VITAL SIGNS:  Blood pressure 130/74, pulse 56, and weight 129 pounds.  LUNGS:  Lung fields are clear.  HEART:  There is a minimal systolic ejection murmur.  No diastolic  murmurs appreciated.  EXTREMITIES:  No edema.   Electrocardiogram demonstrates normal sinus rhythm/sinus bradycardia,  otherwise unremarkable.   IMPRESSION:  1. Ascending thoracic aortic aneurysm for which the patient wants no      treatment.  2. History of prior recent cerebrovascular accident for which she was      placed on Plavix.  She stopped and resumed aspirin therapy.  3. Hypertension. on beta-blockade.   RECOMMENDATIONS:  We will continue her aspirin at the present dose.  I  will have her return to clinic in 1 year in followup.  Otherwise no  change in medication.     Arturo Morton. Riley Kill, MD, [facility]  Electronically Signed    TDS/MedQ  DD: 07/16/2008  DT: 07/17/2008  Job #: [id number]

## 2011-03-01 NOTE — Consult Note (Signed)
NAME:  [name] [name], [name] [name]               ACCOUNT NO.:  [id number]   MEDICAL RECORD NO.:  [id number]          PATIENT TYPE:  INP   LOCATION:  6732                         FACILITY:  [facility]   PHYSICIAN:  Deanna Artis. Hickling, M.D.DATE OF BIRTH:  [birth year]   DATE OF CONSULTATION:  05/26/2007  DATE OF DISCHARGE:                                 CONSULTATION   CHIEF COMPLAINT:  Diplopia.   HISTORY OF PRESENT CONDITION:  The patient is a 77 year old woman who  had experienced diplopia, while at lunch in [place].  She said that  her vision had been off, from the moment that she awakened.  She finally  determined that she was having double vision, and it appeared to be  horizontal in nature.   The patient continued her lunch, because she was not having weakness,  difficulty with her speech or swallowing or drooling.  She felt a little  unsteady on her feet but did not fall.   She was then seen by Dr. Stark Klein, optometrist, who carried out a  thorough examination and found no evidence of eye muscle weakness and no  abnormalities in her eyes.  He felt that she was having a medical  problem and recommended blood pressure check and evaluation by her  physician.  The patient was then seen by Dr. Marlise Eves, nurse  practitioner, and by Dr. Hetty Ely who determined that she should be  admitted to the hospital to evaluate a stroke.  Apparently, a right  carotid bruit was heard, with the double vision and  gait dysfunction,  concerns were raised for a possible, non-hemorrhagic stroke.   The patient was admitted to hospital, and while in the emergency room  around midnight, her symptoms subsided.   She had an MRI scan of the brain which showed a number of findings.  There were non-hemorrhagic remote infarctions in both basal ganglia  regions, right greater than left.  There was a small area of increased  blood products in the right cerebellar peduncle that may have related to  a remote  hemorrhagic infarction.  The patient had a draining vein from  the surface of the right parietal lobe, down to the body of the right  lateral ventricle.  This was thought to be a venous angioma and does  have low risk to bleed.  She also has an irregularity of 1 to 2 mm at  the carotid terminus, which was thought perhaps to be an aneurysm.  Even  if this were, his risk of bleeding of less than 1% per year, and at her  age of 67, this of no significance.   In this setting, I was asked to see the patient to determine the  etiology of her dysfunction, with recommendations for further workup and  treatment.   PAST MEDICAL HISTORY:  The patient has a thoracic descending aorta  aortic aneurysm.  She also has had atrial fibrillation in the past.  She  is followed by [facility].  She has had a problem with anemia,  was secondary to vitamin B12, history of intermittent chronic vertigo,  abdominal  hernia, nephrolithiasis.   PAST SURGICAL HISTORY:  Includes partial gastrectomy secondary to an  ulcer 1968 and 1969; back surgery, tubal ligation, hysterectomy and  basal skin cancer removed.   CURRENT MEDICATIONS:  Include a metoprolol 25 mg twice daily, aspirin  325 mg daily (the latter was just started and in the setting of anemia  is probably not a good idea).   DRUG ALLERGIES:  None known.  She has been told avoid aspirin, because  of her previous GI bleeds.   FAMILY HISTORY:  Mother died at age 19 of cholecystitis.  Father died at  age 50 with arthritis, kidney infections and recurrent strokes.  She has  a sister who was alive in 2004.  I did not ask her about that.   SOCIAL HISTORY:  The patient lives in [place] with her husband.  She  is married and has four children.  She does not use tobacco or alcohol.   A 12-system review is recorded in the chart, and no other abnormalities  were noted, except those mentioned in the history of the present  illness.   EXAMINATION:   GENERAL:  On examination today, this is a well-developed,  well-nourished, right-handed woman in no acute distress.  VITAL SIGNS:  Blood pressure 115/74, resting pulse 88, respirations 18,  oxygen saturation 93%, temperature 99.  EAR, NOSE AND THROAT:  No signs of infection.  The patient has a right  carotid bruit.  LUNGS:  Clear.  HEART:  No murmurs.  Pulses normal.  ABDOMEN:  Soft.  Bowel sounds normal.  EXTREMITIES:  Unremarkable.  NEUROLOGIC:  Awake, alert.  Cranial nerves:  Round reactive pupils,  visual fields full, extraocular movements full and conjugate, symmetric  facial strength, no diplopia, midline tongue air conduction greater than  bone conduction bilaterally.  Motor examination: Normal strength, no  drift.  Fine motor movements normal.  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  Good proprioception and stereognosis.  GAIT:  The patient is normal heel and total walking, normal tandem,  negative Romberg.  Deep tendon reflexes were normal.  The patient had  bilateral flexor plantar responses.   IMPRESSION:  1. Diplopia and gait dystaxia horizontal.  I can not rule out a brain      stem transient ischemic attack, 435.8.  The symptoms lasted longer      than TIAs usually do, but there are no signs of acute stroke.  2. Descending thoracic aortic aneurysm.  3. Risk factors for stroke including atrial fibrillation, possible      hypertension and prior strokes.  4. There are no signs of occlusion of the great vessels of the neck or      head.  5. A number of remote strokes, some non-hemorrhagic, some hemorrhagic,      which are also risk factors for stroke.  The other abnormalities      noted are of low likelihood to cause of bleeding or stroke.  The      patient's anemia is actually quite stable, hematocrits going from      36.3 to 33.5, back up to 37.4.  The patient has had problems with      ulcers before.  She has mild congestive heart failure with a  BNP of      192.   RECOMMENDATIONS:  1. The patient needs a serum lipid profile, hemoglobin A1c and serum      homocystine.  I expect the hemoglobin A1c to be normal,  with a      glucose of 85.  2. On August 11, she should have a 2-D echocardiogram.  This could      also be done as an outpatient.  3. I would use Plavix 75 mg daily in place of aspirin, because of      decreased chances of bleeding, decreased risk of bleeding.  4. Continue metoprolol.   I appreciate the opportunity to participate in her care.  She does not  need ongoing neurologic followup.  If you have questions where I can be  of assistance, do not hesitate to contact me.      Deanna Artis. Sharene Skeans, M.D.  Electronically Signed     [facility]/MEDQ  D:  05/26/2007  T:  05/27/2007  Job:  [id number]   cc:   Titus Dubin. Alwyn Ren, MD,FACP,FCCP  Arta Silence, MD

## 2011-03-04 NOTE — Discharge Summary (Signed)
NAME:  [name] [name], [name] [name]                         ACCOUNT NO.:  [id number]   MEDICAL RECORD NO.:  [id number]                   PATIENT TYPE:  INP   LOCATION:  3703                                 FACILITY:  [facility]   PHYSICIAN:  Arturo Morton. Riley Kill, M.D. [facility]         DATE OF BIRTH:  November 29, 1925   DATE OF ADMISSION:  10/21/2002  DATE OF DISCHARGE:  10/23/2002                           DISCHARGE SUMMARY - REFERRING   HOSPITAL PROCEDURES:  None   REASON FOR ADMISSION:  The patient is a 77 year old female, whose husband is  followed by Dr. Charlies Constable and Dr. Shawnie Pons, who otherwise has no  prior history of heart disease, and she was referred by Dr. Lorenza Chick group  for evaluation of  tachycardia-palpitations and documented SVT.  Please  refer to admission note for full details.   LABORATORY DATA:  WBC 11.3, hemoglobin 9.9, hematocrit 31.0 (MCV 81),  platelets 314, neutrophils 89%.  INR 1.1.  D-dimer 0.34.  Sodium 138,  potassium 3.9, glucose 91, BUN 6, creatinine 0.5.  Normal liver enzymes.  Cardiac enzymes:  CPK/MB normal times three.  Troponin I 0.03 (marker was  normal).  Lipid profile:  Total cholesterol 123, triglycerides 74, LDH 32,  LDL 56.  TSH 2.028.   HOSPITAL COURSE:  The patient ruled out for myocardial infarction with  normal serial cardiac enzymes.  Remaining laboratory unrevealing save for  normocytic anemia and mild leukocytosis with left shift.  ENT was elevated  (792).   Continuous monitoring revealed no SVT with occasional PACs and PVCs.  No  nonsustained ventricular tachycardia noted.   2D echocardiogram revealed mild LVD (EF 40-50%) with mild diffuse LV  hypokinesis and moderate HKA of the mid distal anterior wall; marked  dilatation of the ascending aorta with mild aortic root dilatation and mild  aortic regurgitation.  There was also mild increase of the PAP.   A follow up CT scan, reviewed by Dr. Shawnie Pons, revealed a 5.5 x 5.5 CM  ascending  aneurysm.  Dr. Riley Kill wondered if this was related to bicuspid  valve.  He noted elevated jugular venous pressure after fluid load.   The patient was symptomatically improved on hospital day two and wanted to  go home.  At this point, she did not want to proceed any surgery and thus  was cleared for discharge by Dr. Riley Kill.  However, he will arrange for the  patient to follow up with him in approximately two weeks to discuss possibly  coronary angiography and consideration for vascular surgery.  Dr. Riley Kill  did confer with Dr. Laneta Simmers.   MEDICATIONS ADJUSTMENTS THIS ADMISSION:  An addition of low dose Lasix with  supplemental potassium.  We will check a follow up BMET at time of office  follow up in one week.   The patient is also to return to Dr. Hetty Ely in one week for follow up of  anemia.   A chest x-ray performed the  morning of admission was pending.   DISCHARGE MEDICATIONS:  1. Lopressor 50 mg b.i.d.  2. Lasix 20 mg q.o.d.  3. K-Dur 10 mEq q.o.d.  4. B-12 injection q.monthly.   DISCHARGE INSTRUCTIONS:  The patient is scheduled to follow up Gene Serpe,  P.A-C on Wednesday, October 30, 2002 at 4:30 p.m.  The patient will then  return the following week on Friday, November 08, 2002 at 11:15 a.m. for  further evaluation by Dr. Shawnie Pons.   The patient is also instructed to follow up with Dr. Laurita Quint in one  week for evaluation of anemia.   DISCHARGE DIAGNOSES:  1. Ascending aortic aneurysm.     a. 5.5 x 5.5 cm - chest CT.  2. Tachycardic palpitations.     a. Frequent premature atrial contractions/premature ventricular        contractions.  3. Mild left ventricular dysfunction (ejection fraction 40-50%).     a. Mild aortic regurgitation/mitral regurgitation.     b. Left ventricular wall motion abnormalities.  4. Normocytic anemia.  5. Status post upper respiratory infection.         Gene Serpe, P.A. [facility]                      Thomas D. Riley Kill, M.D.  [facility]    GS/MEDQ  D:  10/23/2002  T:  10/23/2002  Job:  [id number]   cc:   Laurita Quint, M.D.  [address] [place]  [place] [address]  Fax: [phone]

## 2011-03-04 NOTE — Assessment & Plan Note (Signed)
[facility]                            CARDIOLOGY OFFICE NOTE   NAME:Heinlein, MONIKE BRAGDON                      MRN:          [id number]  DATE:12/20/2006                            DOB:          [birth year]    Ms. Stepney is in today for followup.  She continues to get along well.  She is tolerating her medicines without any difficulty.  Her last CT  scan was in 2004, but she has made it clear that she does not want to  have a repeat study.  She has also had continued followup of her anemia  at the [facility].   Her medications include B12 injections monthly and metoprolol 50 mg 1/2  tablet p.o. b.i.d.   PHYSICAL EXAMINATION:  The blood pressure is 122/86, pulse is 85.  The lung fields are clear.  The weight is 132.  The cardiac rhythm is regular without a significant murmur or rub.  There is no extremity edema.   The electrocardiogram demonstrates sinus bradycardia with a rare  premature ventricular contraction.   IMPRESSION:  1. History of known ascending aortic aneurysm.  2. Desire for no surgery.  3. Anemia with B12.   PLAN:  1. Continue current medical regimen.  2. Return to clinic in 1 year.     Arturo Morton. Riley Kill, MD, [facility]  Electronically Signed    TDS/MedQ  DD: 12/20/2006  DT: 12/20/2006  Job #: [id number]

## 2011-03-04 NOTE — Assessment & Plan Note (Signed)
 [facility]                              CARDIOLOGY OFFICE NOTE   NAME:[name] [name], [name] [name]                      MRN:          [id number]  DATE:06/22/2006                            DOB:          01-24-1926    [name] [name] is in today for a follow-up visit, and she is accompanied by her  husband.  She has not been having any significant chest pain or progressive  shortness of breath.  She denies any syncope or presyncope.  She has  continued to followup with Dr. Hetty Ely but has not been seen in some time.  The last laboratory studies from our office and Dr. Hetty Ely in February,  2005 did suggest a hemoglobin of 14.1.   Her current medications include a B12 injection 1000 mg monthly and  metoprolol 150 mg 1/2 tablet p.o. b.i.d.   PHYSICAL EXAMINATION:  GENERAL:  She is an alert, oriented female.  VITAL SIGNS:  Weight is 133.  Blood pressure 138/72, pulse 72 and regular.  Respirations are unlabored.  LUNGS:  Clear to auscultation and percussion.  CARDIAC:  Unchanged from previous examinations with a 1/6 systolic ejection  murmur along the left sternal edge.   IMPRESSION:  The patient has an ascending aortic aneurysm that has  previously been measured at 5.5 cm.  We have talked about the possibility of  repeat CT scanning and repeat echocardiography.  The patient has been  adamant, first about not doing any unnecessary testing or repeating any of  these things and most importantly about the idea that there is no  circumstance under which she would like to have surgery.  Notably, the  patient did have an anemia in 2004 on her admission with a hemoglobin of 9.9  and hematocrit of 31 with an MCV of 81; however, her most recent laboratory  studies suggest normal findings.  She wants to be treated symptomatically,  and in accompaniment with her husband has been adamant about the fact that  she does not want aggressive therapy or management of previously  noted  findings; however, she does want to be continued to be followed up in the  office and obviously, if her symptoms would dramatically change, she would  likely call.   The EKG today demonstrates sinus rhythm with a PAC and nonspecific ST  abnormalities.                              Arturo Morton. Riley Kill, MD, [facility]    TDS/MedQ  DD:  06/23/2006  DT:  06/23/2006  Job #:  [id number]

## 2011-03-04 NOTE — H&P (Signed)
NAME:  [name] [name], [name] [name]                         ACCOUNT NO.:  [id number]   MEDICAL RECORD NO.:  [id number]                   PATIENT TYPE:  INP   LOCATION:  1826                                 FACILITY:  [facility]   PHYSICIAN:  Arturo Morton. Riley Kill, M.D. [facility]         DATE OF BIRTH:  1926-02-29   DATE OF ADMISSION:  10/21/2002  DATE OF DISCHARGE:                                HISTORY & PHYSICAL   CHIEF COMPLAINT:  Weakness.   HISTORY OF PRESENT ILLNESS:  The patient is a 77 year old white female.  Charlies Constable, M.D., and I have been involved in the care of her husband  previously.  The patient has no prior history of coronary artery disease.  Over the past few days, she developed an upper respiratory infection.  She  had a low-grade cough, as well as a temperature one day.  She has just felt  poorly and had a sore throat.  She has had some frequent episodes of  lightheadedness and feeling as though her heart was racing away.  She saw  [place] Constable in the office today, was noted to have skipped beats, and was  sent to the emergency room for evaluation.  Apparently she had runs of SVT.  She denies any history of thyroid disease, CAD, diabetes, or hypertension.   PAST MEDICAL HISTORY:  1. The patient has a history of B12 anemia.  2. She has had partial gastrectomy secondary to ulcer in 1968 and 1969.  3. She has had intermittent chronic vertigo.  4. She had an abdominal hernia.  5. Prior back surgery.  6. There has been nephrolithiasis.  7. Tubal ligation.  8. Hysterectomy.  9. History of basal cell skin cancer removed.   She has had no prior cardiac catheterization.   ALLERGIES:  She has no known drug allergies.   MEDICATIONS:  1. B12 injections monthly.  2. Meclozine on a p.r.n. basis.   FAMILY HISTORY:  Her mother died at 66 of cholecystitis.  Her father died at  74 with arthritis, kidney infections, and recurrent strokes.  She has one  sister who is alive at age 70.   SOCIAL  HISTORY:  The patient lives in [place], [place], with her  husband.  She was previously a patient of Titus Dubin. Alwyn Ren, M.D., and was  transferred to [facility].  She is married and has four children.  She does  not smoke.  There is no history of alcohol use.  She does avoid sweets and  milk products.   REVIEW OF SYSTEMS:  She denies any weight loss.  She denies any chronic  fever.  She has had some nasal discharge and some mild shortness of breath,  as well as some weakness and fatigue.  She thought that this was from B12  deficiency, but apparently her hemoglobin was okay.   PHYSICAL EXAMINATION:  GENERAL APPEARANCE:  She is a well-appearing, alert  female in  no distress.  VITAL SIGNS:  The SAO2 was 97% on room air.  The blood pressure was 128/83  and respiratory rate 20.  The pulse varied between 80-120.  HEENT:  Unremarkable.  NECK:  Supple.  She has very mild bilateral thyroid enlargement, but no  obvious nodules.  There is no lymphadenopathy.  HEART:  The heart rhythm reveals frequent extrasystoles.  There is no  definite murmur.  LUNGS:  Clear to auscultation and percussion bilaterally at both bases.  SKIN:  No rashes.  EXTREMITIES:  No edema.  NEUROLOGIC:  Intact without obvious abnormality.   LABORATORY DATA:  The patient's electrocardiogram demonstrates normal sinus  rhythm.  There are frequent PACs.  The baseline EKG reveals a normal axis.  There is a slight delay in R-wave progression, but no significant.   The chest x-ray reveals no obvious active disease.   IMPRESSION:  1. Weakness and fatigue of uncertain etiology.  2. Frequent extrasystoles, including premature atrial contractions and some     premature ventricular contractions without defined sustained arrhythmia.   PLAN:  1. 24-hour observation.  2. Laboratory work, including thyroid function studies.  3. Cardiac enzymes.  4. 2-D echocardiogram to assess left ventricular function.  5. Later  exercise Cardiolite to rule out ischemia.  6. Beta blockers for rhythm control.                                               Arturo Morton. Riley Kill, M.D. [facility]    TDS/MEDQ  D:  10/21/2002  T:  10/21/2002  Job:  [id number]   cc:   Doreen Salvage, M.D.  [address] [place]  [place] [address]  Fax: [phone]

## 2011-04-07 ENCOUNTER — Encounter: Payer: Self-pay | Admitting: Cardiovascular Disease

## 2011-04-14 ENCOUNTER — Encounter: Payer: Self-pay | Admitting: Internal Medicine

## 2011-08-01 LAB — COMPREHENSIVE METABOLIC PANEL
BUN: 12
CO2: 29
Calcium: 8.9
Creatinine, Ser: 0.74
GFR calc non Af Amer: >60
Glucose, Bld: 85
Total Bilirubin: 0.3

## 2011-08-01 LAB — APTT: aPTT: 33

## 2011-08-01 LAB — B-NATRIURETIC PEPTIDE (CONVERTED LAB): Pro B Natriuretic peptide (BNP): 192 — ABNORMAL HIGH

## 2011-08-01 LAB — CBC
Hemoglobin: 11.2 — ABNORMAL LOW
Hemoglobin: 12
MCHC: 32.9
MCHC: 33.5
MCV: 87.4
Platelets: 284
RBC: 3.83 — ABNORMAL LOW
RDW: 15.2 — ABNORMAL HIGH

## 2011-08-01 LAB — I-STAT 8, (EC8 V) (CONVERTED LAB)
Bicarbonate: 25.7 — ABNORMAL HIGH
Glucose, Bld: 106 — ABNORMAL HIGH
Sodium: 140
TCO2: 27

## 2011-08-01 LAB — HEMOGLOBIN AND HEMATOCRIT, BLOOD
HCT: 34.4 — ABNORMAL LOW
Hemoglobin: 11.4 — ABNORMAL LOW
Hemoglobin: 11.5 — ABNORMAL LOW

## 2011-08-01 LAB — LIPID PANEL
Cholesterol: 163
HDL: 48
Triglycerides: 78

## 2011-08-01 LAB — PROTIME-INR: Prothrombin Time: 12.7

## 2011-08-01 LAB — HEMOGLOBIN A1C: Mean Plasma Glucose: 111

## 2011-08-04 ENCOUNTER — Ambulatory Visit (INDEPENDENT_AMBULATORY_CARE_PROVIDER_SITE_OTHER): Payer: Medicare Other

## 2011-08-04 DIAGNOSIS — Z23 Encounter for immunization: Secondary | ICD-10-CM

## 2011-08-08 ENCOUNTER — Other Ambulatory Visit: Payer: Self-pay | Admitting: Cardiology

## 2011-08-16 ENCOUNTER — Encounter: Payer: Self-pay | Admitting: Cardiology

## 2011-08-16 ENCOUNTER — Ambulatory Visit (INDEPENDENT_AMBULATORY_CARE_PROVIDER_SITE_OTHER): Payer: Medicare HMO | Admitting: Cardiology

## 2011-08-16 VITALS — BP 128/70 | HR 68 | Resp 18 | Ht 62.0 in | Wt 101.1 lb

## 2011-08-16 DIAGNOSIS — D649 Anemia, unspecified: Secondary | ICD-10-CM

## 2011-08-16 DIAGNOSIS — I712 Thoracic aortic aneurysm, without rupture: Secondary | ICD-10-CM

## 2011-08-16 DIAGNOSIS — I10 Essential (primary) hypertension: Secondary | ICD-10-CM

## 2011-08-16 NOTE — Patient Instructions (Signed)
Your physician wants you to follow-up in: 1 YEAR.  You will receive a reminder letter in the mail two months in advance. If you don't receive a letter, please call our office to schedule the follow-up appointment.  Your physician recommends that you continue on your current medications as directed. Please refer to the Current Medication list given to you today.  

## 2011-08-16 NOTE — Progress Notes (Signed)
HPI:  She has continued to remain stable.  However, she has not been seen in primary care in over two years, and they made her an appointment, with labs for the near future.  She has had a large thoracic aneurysm, and anemia.  She has always refused surgery, dating back to 2004.  She has been very comfortable with that decision, and quite frankly, has done very well.  No new symptoms. She says she is just fine.  Scheduled to see Dr. Ermalene Searing.  Current Outpatient Prescriptions  Medication Sig Dispense Refill  . acetaminophen (TYLENOL) 325 MG tablet Take 325 mg by mouth every 6 (six) hours as needed.        Marland Kitchen aspirin 81 MG tablet Take 162 mg by mouth daily.        . cyanocobalamin 1000 MCG tablet Take 1,000 mcg by mouth every 30 (thirty) days.        . meclizine (ANTIVERT) 25 MG tablet Take 25 mg by mouth as needed.        . metoprolol (LOPRESSOR) 50 MG tablet TAKE ONE-HALF TABLET BY MOUTH TWICE DAILY  90 tablet  2    No Known Allergies  Past Medical History  Diagnosis Date  . Thoracic aortic aneurysm   . Stroke   . Hypertension     Unspecified  . Diverticular disease   . COPD (chronic obstructive pulmonary disease)   . Aortic aneurysm   . Carcinoma, basal cell, skin   . Renal calculus   . Anemia   . Dizziness   . Cellulitis and abscess     of trunk  . TIA (transient ischemic attack)   . Carotid bruit     right  . Diplopia     Past Surgical History  Procedure Date  . 2-d echo 8/08  . Stomach surgery 1968    X2 Billroth II; Vagotomysubtotal gastric/ gastrectomy (1989)  . Back surgery 1986  . Hernia repair   . Tubal ligation   . Partial hysterectomy     for Fibroids  . Chest ct 10/21/2002    Ascending aneurysm; 5.5 X 5.5  . Upper gi 08/2000    with small bowel follow through  . Carotid doppler 12/1998    Negative  . Basal cell carcinoma excision     Family History  Problem Relation Age of Onset  . Stroke Father 42  . Cholecystitis Mother 63  . Cancer Maternal Aunt    . Heart attack Neg Hx   . Other Neg Hx     CV/HBP  . Hypertension Neg Hx     History   Social History  . Marital Status: Married    Spouse Name: N/A    Number of Children: N/A  . Years of Education: N/A   Occupational History  . Retired    Social History Main Topics  . Smoking status: Never Smoker   . Smokeless tobacco: Not on file  . Alcohol Use: No  . Drug Use: No  . Sexually Active: Not on file   Other Topics Concern  . Not on file   Social History Narrative   Lives with husband    ROS: Please see the HPI.  All other systems reviewed and negative.  PHYSICAL EXAM:  BP 128/70  Pulse 68  Resp 18  Ht 5\' 2"  (1.575 m)  Wt 101 lb 1.9 oz (45.868 kg)  BMI 18.50 kg/m2  General:  Thin, elderly, frail female with very friendly disposition. Head:  Normocephalic  and atraumatic. Neck: no JVD Lungs: Clear to auscultation and percussion. Heart: Normal S1 and S2.  No murmur, rubs or gallops.  Abdomen:  Normal bowel sounds; soft; non tender; no organomegaly Pulses: Pulses normal in all 4 extremities. Extremities: No clubbing or cyanosis. No edema. Neurologic: Alert and oriented x 3.  EKG:  NSR.  LVH with repole changes.  Doubt septal infarct  (delay in R wave).  ASSESSMENT AND PLAN:

## 2011-08-18 NOTE — Assessment & Plan Note (Signed)
Currently controlled, which has been one of the goals of treatment, given her wish for conservative management of her thoracic aneurysm.

## 2011-08-18 NOTE — Assessment & Plan Note (Signed)
Followed at [facility], but she has not been seen in quite awhile.  She does have a follow up and I have encouraged.

## 2011-08-18 NOTE — Assessment & Plan Note (Signed)
She has not wanted either further evaluation or treatment.  She turned down surgery in 2004, and is comfortable with her decision.

## 2011-10-06 ENCOUNTER — Encounter: Payer: Self-pay | Admitting: Family Medicine

## 2011-10-07 ENCOUNTER — Encounter: Payer: Self-pay | Admitting: Family Medicine

## 2011-10-07 ENCOUNTER — Ambulatory Visit (INDEPENDENT_AMBULATORY_CARE_PROVIDER_SITE_OTHER): Payer: Medicare HMO | Admitting: Family Medicine

## 2011-10-07 DIAGNOSIS — I712 Thoracic aortic aneurysm, without rupture: Secondary | ICD-10-CM

## 2011-10-07 DIAGNOSIS — R350 Frequency of micturition: Secondary | ICD-10-CM | POA: Insufficient documentation

## 2011-10-07 DIAGNOSIS — D509 Iron deficiency anemia, unspecified: Secondary | ICD-10-CM

## 2011-10-07 DIAGNOSIS — J449 Chronic obstructive pulmonary disease, unspecified: Secondary | ICD-10-CM

## 2011-10-07 DIAGNOSIS — M47816 Spondylosis without myelopathy or radiculopathy, lumbar region: Secondary | ICD-10-CM | POA: Insufficient documentation

## 2011-10-07 DIAGNOSIS — I10 Essential (primary) hypertension: Secondary | ICD-10-CM

## 2011-10-07 DIAGNOSIS — D518 Other vitamin B12 deficiency anemias: Secondary | ICD-10-CM

## 2011-10-07 DIAGNOSIS — G459 Transient cerebral ischemic attack, unspecified: Secondary | ICD-10-CM

## 2011-10-07 DIAGNOSIS — M47817 Spondylosis without myelopathy or radiculopathy, lumbosacral region: Secondary | ICD-10-CM

## 2011-10-07 NOTE — Assessment & Plan Note (Signed)
Well controlled. Continue current medication.  

## 2011-10-07 NOTE — Assessment & Plan Note (Signed)
On ASA. She wishes to know cholesterol but not interested in treating aggressively.

## 2011-10-07 NOTE — Patient Instructions (Signed)
Fasting  labs sometime next week. Schedule annual medicare wellness in 02/2012.

## 2011-10-07 NOTE — Progress Notes (Signed)
  Subjective:    Patient ID: [name] [name], female    DOB: [birth year], 77 y.o.   MRN: [id number]  HPI  77 year old female previous pt of Billie Bean presents to establish care.  Last office visit with BIllie was.Marland KitchenMarland Kitchen2010  Last CPX was.Marland KitchenMarland Kitchen2010  She has a history of the following pertinent chronic health issues:  Hypertension:  Well controlled on on Lopressor. Per pt ..B Blocker was started to keep pressure off of aneurysm. Using medication without problems or lightheadedness:  Chest pain with exertion: Edema: Short of breath: Average home BPs: Other issues:  COPD: : pt reports no known history. She reports no issues breathing currently. No past smoking history and no second hand smoke.   CVA/TIA: occurred years ago, had double vision for few hours.  On aspirin.  Thoracic aortic aneurysm: She has always refused surgery, dating back to 2004. Followed by Dr. Riley Kill. Last seen in 07/2011   Anemia: has had this ever since bilroth surgery on stomach. B12 deficiency and iron def. Malabsorption issues.  Has been giving herself B12 injections monthly.  In past had needed iron infusions with Dr. Welton Flakes years ago.  Due for re-eval. She denies fatigue, she does not feel like she is low given she is not craving ice.  Has yearly skin exam with Derm.. Recent nml.    Review of Systems  Constitutional: Negative for fever and fatigue.  HENT: Negative for ear pain.   Eyes: Negative for pain.  Respiratory: Negative for chest tightness and shortness of breath.   Cardiovascular: Negative for chest pain, palpitations and leg swelling.  Gastrointestinal: Negative for abdominal pain.  Genitourinary: Positive for urgency and frequency. Negative for dysuria, vaginal bleeding and vaginal discharge.       Ongoing for few month  no incontinence  Musculoskeletal: Positive for back pain.       Chronic but tolerable on no meds  Neurological: Negative for dizziness, syncope, weakness and light-headedness.    Psychiatric/Behavioral: Negative for behavioral problems, confusion and dysphoric mood.       Objective:   Physical Exam  Constitutional: She is oriented to person, place, and time.       Elderly female in NAD  Eyes: Conjunctivae are normal. Pupils are equal, round, and reactive to light.  Neck: Normal range of motion. Neck supple. No thyromegaly present.  Cardiovascular: Normal rate, regular rhythm, normal heart sounds and intact distal pulses.  Exam reveals no gallop and no friction rub.   No murmur heard. Pulmonary/Chest: Effort normal and breath sounds normal. No respiratory distress. She has no wheezes. She has no rales. She exhibits no tenderness.  Abdominal: Soft. Bowel sounds are normal. There is no tenderness.  Musculoskeletal:       Scoliosis and khyphosis  Neurological: She is alert and oriented to person, place, and time. She has normal reflexes. She displays normal reflexes. No cranial nerve deficit.  Skin: Skin is warm and dry. No rash noted.  Psychiatric: She has a normal mood and affect. Her behavior is normal. Judgment and thought content normal.          Assessment & Plan:

## 2011-10-07 NOTE — Assessment & Plan Note (Signed)
Cannot give sample .Marland Kitchen Will bring in for UA.

## 2011-10-07 NOTE — Assessment & Plan Note (Signed)
Per pt not aware of diagnosis. No PFTs in records.

## 2011-10-07 NOTE — Assessment & Plan Note (Signed)
Due for re-eval. 

## 2011-10-07 NOTE — Assessment & Plan Note (Signed)
Stable

## 2011-10-07 NOTE — Assessment & Plan Note (Signed)
Due for re-eval of B12 and cbc

## 2011-10-14 ENCOUNTER — Other Ambulatory Visit (INDEPENDENT_AMBULATORY_CARE_PROVIDER_SITE_OTHER): Payer: Medicare HMO

## 2011-10-14 DIAGNOSIS — I10 Essential (primary) hypertension: Secondary | ICD-10-CM

## 2011-10-14 DIAGNOSIS — G459 Transient cerebral ischemic attack, unspecified: Secondary | ICD-10-CM

## 2011-10-14 DIAGNOSIS — D509 Iron deficiency anemia, unspecified: Secondary | ICD-10-CM

## 2011-10-14 DIAGNOSIS — D518 Other vitamin B12 deficiency anemias: Secondary | ICD-10-CM

## 2011-10-14 LAB — COMPREHENSIVE METABOLIC PANEL
Albumin: 3.7 g/dL (ref 3.5–5.2)
Alkaline Phosphatase: 61 U/L (ref 39–117)
BUN: 17 mg/dL (ref 6–23)
CO2: 28 mEq/L (ref 19–32)
Calcium: 9.1 mg/dL (ref 8.4–10.5)
Chloride: 108 mEq/L (ref 96–112)
GFR: 70.69 mL/min (ref >60.00)
Glucose, Bld: 88 mg/dL (ref 70–99)
Potassium: 3.9 mEq/L (ref 3.5–5.1)
Sodium: 144 mEq/L (ref 135–145)
Total Protein: 7.1 g/dL (ref 6.0–8.3)

## 2011-10-14 LAB — VITAMIN B12: Vitamin B-12: 283 pg/mL (ref 211–911)

## 2011-10-14 LAB — CBC WITH DIFFERENTIAL/PLATELET
Basophils Relative: 0.5 % (ref 0.0–3.0)
Eosinophils Relative: 3.6 % (ref 0.0–5.0)
MCV: 98 fl (ref 78.0–100.0)
Monocytes Absolute: 0.5 10*3/uL (ref 0.1–1.0)
Monocytes Relative: 8.2 % (ref 3.0–12.0)
Neutrophils Relative %: 73.3 % (ref 43.0–77.0)
Platelets: 174 10*3/uL (ref 150.0–400.0)
RBC: 4.05 Mil/uL (ref 3.87–5.11)
WBC: 5.5 10*3/uL (ref 4.5–10.5)

## 2011-10-14 LAB — LIPID PANEL
HDL: 68.9 mg/dL (ref >39.00)
LDL Cholesterol: 92 mg/dL (ref 0–99)
Total CHOL/HDL Ratio: 3
VLDL: 13.8 mg/dL (ref 0.0–40.0)

## 2011-10-14 LAB — IBC PANEL: Iron: 47 ug/dL (ref 42–145)

## 2011-11-10 ENCOUNTER — Other Ambulatory Visit: Payer: Self-pay

## 2011-11-10 NOTE — Telephone Encounter (Signed)
pts daughter Sterling Big request refill for Vitamin B 12 injectable and pt request multiple dose vial.Pt saw Dr Bedsole12/21/12. Pt uses [facility]. Please call Sterling Big at [phone] when med called in.

## 2011-11-11 ENCOUNTER — Telehealth: Payer: Self-pay | Admitting: Family Medicine

## 2011-11-11 MED ORDER — CYANOCOBALAMIN 1000 MCG/ML IJ SOLN: 1000.0000 ug | INTRAMUSCULAR | Status: DC

## 2011-11-11 NOTE — Telephone Encounter (Signed)
Pt's daughter called in yesterday and requested this be called into [facility].  The previous phone note indicates this should have been sent to [facility].  Pt's daughter called to notify us that the med was sent to [facility] and she would like the [facility] prescription cancelled and another refill sent to [facility] and a call back when this has been done.  Please apologize to the patient's daughter for the refill being sent to [facility] and let me know when it has been called in so I can call her back as well.  Thanks

## 2011-11-14 ENCOUNTER — Other Ambulatory Visit: Payer: Self-pay | Admitting: *Deleted

## 2011-11-14 MED ORDER — CYANOCOBALAMIN 1000 MCG/ML IJ SOLN: 1000.0000 ug | INTRAMUSCULAR | Status: DC

## 2011-11-14 NOTE — Telephone Encounter (Signed)
Patients rx has already been transferred to [facility]. Tried to reach patients daughter to apologize but, didn't get answer asked to call me back on message and I will also try to call again

## 2011-11-14 NOTE — Telephone Encounter (Signed)
Patients daughter advised and apologized for the mistake patient daughter was very nice.

## 2011-11-14 NOTE — Telephone Encounter (Signed)
Send rx (? Med) to correct pharmacy.

## 2011-11-14 NOTE — Telephone Encounter (Signed)
Pt needed rx sent to [facility] pharmacy

## 2011-11-15 NOTE — Telephone Encounter (Signed)
Sterling Big notified as instructed by telephone.

## 2012-04-25 ENCOUNTER — Other Ambulatory Visit: Payer: Self-pay | Admitting: Cardiology

## 2012-05-18 ENCOUNTER — Other Ambulatory Visit: Payer: Self-pay | Admitting: Family Medicine

## 2012-05-18 MED ORDER — MECLIZINE HCL 25 MG PO TABS: 25.0000 mg | ORAL_TABLET | ORAL | Status: DC | PRN

## 2012-05-18 NOTE — Telephone Encounter (Signed)
Medicine called to [facility], cancelled at [facility].

## 2012-05-18 NOTE — Telephone Encounter (Signed)
Patient's daughter is requesting Meclizine 25mg  for patient's inner ear problems. Patient is experiencing nauseau and dizziness and this medication has been prescribed to her before and she is out.  Patient will need today for symptoms described.  Please send prescription to [facility] and call patient's daughter at [phone].

## 2012-08-06 ENCOUNTER — Other Ambulatory Visit: Payer: Self-pay | Admitting: Family Medicine

## 2012-08-06 ENCOUNTER — Telehealth: Payer: Self-pay | Admitting: Cardiology

## 2012-08-06 NOTE — Telephone Encounter (Signed)
Pt's daughter asking about recommendations for OTC meds for pt to treat cold symptoms. Advised to use plain coricidin. Pt's daughter asking if OK to use plain mucinex. Per Sally--OK to use plain mucinex. Pt's daughter advised.

## 2012-08-06 NOTE — Telephone Encounter (Signed)
plz return call to Daughter-Beth  [phone]   Daughter would like to know  What over the counter meds can patient take for cold .

## 2012-08-09 ENCOUNTER — Ambulatory Visit (INDEPENDENT_AMBULATORY_CARE_PROVIDER_SITE_OTHER): Payer: Medicare HMO | Admitting: Family Medicine

## 2012-08-09 ENCOUNTER — Telehealth: Payer: Self-pay

## 2012-08-09 ENCOUNTER — Ambulatory Visit (INDEPENDENT_AMBULATORY_CARE_PROVIDER_SITE_OTHER)
Admission: RE | Admit: 2012-08-09 | Discharge: 2012-08-09 | Disposition: A | Discharge status: Home / Self Care | Payer: Medicare HMO | Source: Ambulatory Visit | Attending: Family Medicine | Admitting: Family Medicine

## 2012-08-09 ENCOUNTER — Encounter: Payer: Self-pay | Admitting: Family Medicine

## 2012-08-09 VITALS — BP 118/78 | HR 51 | Temp 98.3°F | Wt 102.0 lb

## 2012-08-09 DIAGNOSIS — J449 Chronic obstructive pulmonary disease, unspecified: Secondary | ICD-10-CM

## 2012-08-09 DIAGNOSIS — J189 Pneumonia, unspecified organism: Secondary | ICD-10-CM | POA: Insufficient documentation

## 2012-08-09 DIAGNOSIS — R05 Cough: Secondary | ICD-10-CM

## 2012-08-09 MED ORDER — AZITHROMYCIN 250 MG PO TABS: ORAL_TABLET | ORAL | Status: DC

## 2012-08-09 NOTE — Telephone Encounter (Signed)
Rob with [employer] called pt is waiting for z pack and [facility] does not have order. Was sent to [facility] [address] in error. Gave verbal order as instructed to Rob and called cancelled rx at [address]. Spoke with Cala Bradford.

## 2012-08-09 NOTE — Progress Notes (Signed)
  Subjective:    Patient ID: [name] [name], female    DOB: Nov 28, 1925, 78 y.o.   MRN: [id number]  Cough This is a new problem. The current episode started in the past 7 days. The problem has been gradually worsening. The cough is productive of purulent sputum (darker). Associated symptoms include ear congestion, ear pain, nasal congestion, postnasal drip and rhinorrhea. Pertinent negatives include no chest pain, chills, fever, headaches, myalgias, rash, sore throat, shortness of breath or wheezing. Associated symptoms comments: Brief right ear pain, some vertigo 4 days ago gone now.. Nothing aggravates the symptoms. She has tried OTC cough suppressant (mucinex generic) for the symptoms. The treatment provided mild relief. Her past medical history is significant for COPD and environmental allergies.      Review of Systems  Constitutional: Negative for fever and chills.  HENT: Positive for ear pain, rhinorrhea and postnasal drip. Negative for sore throat.   Respiratory: Positive for cough. Negative for shortness of breath and wheezing.   Cardiovascular: Negative for chest pain.  Musculoskeletal: Negative for myalgias.  Skin: Negative for rash.  Neurological: Negative for headaches.  Hematological: Positive for environmental allergies.       Objective:   Physical Exam  Constitutional: Vital signs are normal. She appears well-developed and well-nourished. She is cooperative.  Non-toxic appearance. She does not appear ill. No distress.  HENT:  Head: Normocephalic.  Right Ear: Hearing, tympanic membrane, external ear and ear canal normal. Tympanic membrane is not erythematous, not retracted and not bulging.  Left Ear: Hearing, tympanic membrane, external ear and ear canal normal. Tympanic membrane is not erythematous, not retracted and not bulging.  Nose: Mucosal edema and rhinorrhea present. Right sinus exhibits no maxillary sinus tenderness and no frontal sinus tenderness. Left sinus  exhibits no maxillary sinus tenderness and no frontal sinus tenderness.  Mouth/Throat: Uvula is midline, oropharynx is clear and moist and mucous membranes are normal.  Eyes: Conjunctivae normal, EOM and lids are normal. Pupils are equal, round, and reactive to light. No foreign bodies found.  Neck: Trachea normal and normal range of motion. Neck supple. Carotid bruit is not present. No mass and no thyromegaly present.  Cardiovascular: Normal rate, regular rhythm, S1 normal, S2 normal, normal heart sounds, intact distal pulses and normal pulses.  Exam reveals no gallop and no friction rub.   No murmur heard. Pulmonary/Chest: Effort normal. Not tachypneic. No respiratory distress. She has decreased breath sounds in the left lower field. She has no wheezes. She has no rhonchi. She has no rales.       Decreased breath sounds may be from severe scoliosis she has  Neurological: She is alert.  Skin: Skin is warm, dry and intact. No rash noted.  Psychiatric: Her speech is normal and behavior is normal. Judgment normal. Her mood appears not anxious. Cognition and memory are normal. She does not exhibit a depressed mood.          Assessment & Plan:

## 2012-08-09 NOTE — Patient Instructions (Addendum)
Schedule medicare wellness on your way out with fasting labs prior. Start antibiotics. Use mucinex Dm for cough.  Call if not turning the corner in the next 3 days.  GO to ER if shortness of breath.

## 2012-08-09 NOTE — Assessment & Plan Note (Signed)
No clear pneumonia. Will treat for acute bronchitis with azithromycin given age and length of treatment.

## 2012-08-13 ENCOUNTER — Encounter: Payer: Self-pay | Admitting: Family Medicine

## 2012-08-13 ENCOUNTER — Ambulatory Visit (INDEPENDENT_AMBULATORY_CARE_PROVIDER_SITE_OTHER): Payer: Medicare HMO | Admitting: Family Medicine

## 2012-08-13 ENCOUNTER — Telehealth: Payer: Self-pay | Admitting: Family Medicine

## 2012-08-13 VITALS — BP 120/78 | HR 88 | Temp 98.3°F | Wt 102.5 lb

## 2012-08-13 DIAGNOSIS — J441 Chronic obstructive pulmonary disease with (acute) exacerbation: Secondary | ICD-10-CM

## 2012-08-13 DIAGNOSIS — J159 Unspecified bacterial pneumonia: Secondary | ICD-10-CM

## 2012-08-13 MED ORDER — LEVOFLOXACIN 500 MG PO TABS: 500.0000 mg | ORAL_TABLET | Freq: Every day | ORAL | Status: DC

## 2012-08-13 MED ORDER — PREDNISONE 20 MG PO TABS: ORAL_TABLET | ORAL | Status: DC

## 2012-08-13 MED ORDER — ALBUTEROL SULFATE HFA 108 (90 BASE) MCG/ACT IN AERS: 2.0000 | INHALATION_SPRAY | Freq: Four times a day (QID) | RESPIRATORY_TRACT | Status: DC | PRN

## 2012-08-13 NOTE — Telephone Encounter (Signed)
Caller: [name]/Other; Patient Name: [name] [name]; PCP: Kerby Nora [facility]); Best Callback Phone Number: [phone].  Seen 08/09/12 for cough/cold sx.  on Z-pack; not improved or worse, but tired from coughing. No appetite.  Wet cough, no sputum.  Emergent symptoms ruled out.  Appointment with Dr. Patsy Lager for 11:00 on 09/13/12.

## 2012-08-13 NOTE — Patient Instructions (Signed)
F/u Dr. Ermalene Searing on Friday

## 2012-08-13 NOTE — Progress Notes (Signed)
Nature conservation officer at [facility] [address] [place] [address] Phone: [phone] Fax: [phone]  Date:  08/13/2012   Name:  [name] [name]   DOB:  Mar 08, 1926   MRN:  [id number] Gender: female Age: 78 y.o.  PCP:  Kerby Nora, MD  Evaluating MD: Hannah Beat, MD   Chief Complaint: Cough   History of Present Illness:  [name] [name] is a 78 y.o. pleasant patient who presents with the following:  Coughing her head off and not productive over the last couple of days, and it has changed some. Now it is not as frequent or as constant.   Elderly, ill-appearing female who saw my partner Dr. Ermalene Searing on August 09, 2012. At that point, she was felt to have mostly a CPD exacerbation and possibly some bronchitis, but at that point her lung fields per the record were clear. She also had a chest x-ray that was essentially unremarkable.  Since that time, she feels as if she is worsened, she remained afebrile, but her cough has deepened, she is having some shortness of breath. The coughing is not as constant or productive, but she generally feels worse. She is not having any upper respiratory symptoms or runny nose or earache or sore throat.  Patient Active Problem List  Diagnosis  . ANEMIA, B12 DEFICIENCY  . Iron deficiency anemia  . HYPERTENSION, UNSPECIFIED  . CVA  . TIA  . THORACIC AORTIC ANEURYSM  . COPD  . DIVERTICULAR DISEASE  . CAROTID BRUIT, RIGHT  . CARCINOMA, BASAL CELL, HX OF  . RENAL CALCULUS, HX OF  . Osteoarthritis of lumbar spine  . Urine frequency  . Cough    Past Medical History  Diagnosis Date  . Thoracic aortic aneurysm   . Stroke   . Hypertension     Unspecified  . Diverticular disease   . COPD (chronic obstructive pulmonary disease)   . Aortic aneurysm   . Renal calculus   . Anemia   . Dizziness   . Cellulitis and abscess     of trunk  . TIA (transient ischemic attack)   . Carotid bruit     right  . Diplopia   . PUD (peptic  ulcer disease)   . Carcinoma, basal cell, skin     Past Surgical History  Procedure Date  . 2-d echo 8/08  . Stomach surgery 1968    X2 Billroth II; Vagotomysubtotal gastric/ gastrectomy (1989)  . Back surgery 1986  . Hernia repair   . Tubal ligation   . Partial hysterectomy     for Fibroids  . Chest ct 10/21/2002    Ascending aneurysm; 5.5 X 5.5  . Upper gi 08/2000    with small bowel follow through  . Carotid doppler 12/1998    Negative  . Basal cell carcinoma excision     History  Substance Use Topics  . Smoking status: Never Smoker   . Smokeless tobacco: Never Used  . Alcohol Use: No    Family History  Problem Relation Age of Onset  . Stroke Father 37  . Cholecystitis Mother 109  . Cancer Maternal Aunt     facial CA  . Heart attack Neg Hx   . Other Neg Hx     CV/HBP  . Hypertension Neg Hx     No Known Allergies  Medication list has been reviewed and updated.  Outpatient Prescriptions Prior to Visit  Medication Sig Dispense Refill  . acetaminophen (TYLENOL) 325 MG  tablet Take 325 mg by mouth every 6 (six) hours as needed.        Marland Kitchen aspirin 81 MG tablet Take 162 mg by mouth daily.        Marland Kitchen azithromycin (ZITHROMAX) 250 MG tablet 2 tab po x 1 day, then 1 tab po daily  6 tablet  0  . cyanocobalamin (,VITAMIN B-12,) 1000 MCG/ML injection Inject 1 mL (1,000 mcg total) into the muscle every 30 (thirty) days.  1 mL  11  . meclizine (ANTIVERT) 25 MG tablet Take 1 tablet (25 mg total) by mouth as needed.  30 tablet  0  . metoprolol (LOPRESSOR) 50 MG tablet TAKE ONE-HALF TABLET BY MOUTH TWICE DAILY  90 tablet  4    Review of Systems:  Pulmonary symptoms as above. Decreased p.o. Intake. Normal urinary frequency. No chest pain. Chronic back pain.  Physical Examination: Filed Vitals:   08/13/12 1114  BP: 120/78  Pulse: 88  Temp: 98.3 F (36.8 C)  TempSrc: Oral  Weight: 102 lb 8 oz (46.494 kg)  SpO2: 90%    There is no height on file to calculate BMI. Ideal  Body Weight:     GEN: A and O x 3. WDWN. NAD.   Mildly ill-appearing ENT: Nose clear, ext NML.  No LAD.  No JVD.  TM's clear. Oropharynx clear.  PULM: Normal WOB, no distress. CRACKLES IN THE RIGHT MIDDLE AND LOWER LOBES WITH SOME OCCAISIONAL WHEEZES DIFFUSELY. CV: RRR, no M/G/R, No rubs, No JVD.   EXT: warm and well-perfused, No c/c/e. PSYCH: Pleasant and conversant.   Assessment and Plan:  1. Pneumonia, bacterial   2. COPD exacerbation    Lung examination different today compared to prior examination by my partner 4 days prior. Oxygenation decreased from 95% at last office visit 90-92% on room air today.  Probable conversion to focal right-sided pneumonia with chronic obstructive pulmonary disease exacerbation. Change antibiotics to Levaquin. Prednisone, and albuterol p.r.n.  The patient's daughter was with her today, and I counseled strong cautioned that if she worsens, had worsened shortness of breath, and or develops worsening fever that she needed to be reevaluated quickly.  Followup with Dr. Ermalene Searing on Friday for repeat examination.  Orders Today:  No orders of the defined types were placed in this encounter.    Updated Medication List: (Includes new medications, updates to list, dose adjustments) Meds ordered this encounter  Medications  . levofloxacin (LEVAQUIN) 500 MG tablet    Sig: Take 1 tablet (500 mg total) by mouth daily.    Dispense:  7 tablet    Refill:  0  . predniSONE (DELTASONE) 20 MG tablet    Sig: 2 po daily for 4 days, then 1 po daily    Dispense:  12 tablet    Refill:  0  . albuterol (PROVENTIL HFA;VENTOLIN HFA) 108 (90 BASE) MCG/ACT inhaler    Sig: Inhale 2 puffs into the lungs every 6 (six) hours as needed for wheezing.    Dispense:  1 Inhaler    Refill:  3    Medications Discontinued: There are no discontinued medications.   Hannah Beat, MD

## 2012-08-14 ENCOUNTER — Other Ambulatory Visit: Payer: Self-pay | Admitting: Family Medicine

## 2012-08-17 ENCOUNTER — Ambulatory Visit (INDEPENDENT_AMBULATORY_CARE_PROVIDER_SITE_OTHER): Payer: Medicare HMO | Admitting: Family Medicine

## 2012-08-17 ENCOUNTER — Encounter: Payer: Self-pay | Admitting: Family Medicine

## 2012-08-17 VITALS — BP 120/78 | HR 83 | Temp 97.9°F | Resp 15 | Wt 102.2 lb

## 2012-08-17 DIAGNOSIS — J449 Chronic obstructive pulmonary disease, unspecified: Secondary | ICD-10-CM

## 2012-08-17 DIAGNOSIS — J189 Pneumonia, unspecified organism: Secondary | ICD-10-CM

## 2012-08-17 NOTE — Progress Notes (Signed)
  Subjective:    Patient ID: [name] [name], female    DOB: 1926/07/13, 78 y.o.   MRN: [id number]  HPI  Seen on 10/24: CXR clear Returned to see Dr. Salena Saner on 18/28.. Lung feiulds not clear. Dx with  Pneumonia, bacterial   .  COPD exacerbation   His note stated: Lung examination different today compared to prior examination by my partner 4 days prior. Oxygenation decreased from 95% at last office visit 90-92% on room air today.  Probable conversion to focal right-sided pneumonia with chronic obstructive pulmonary disease exacerbation. Change antibiotics to Levaquin. Prednisone, and albuterol p.r.n.  The patient's daughter was with her today, and I counseled strong cautioned that if she worsens, had worsened shortness of breath, and or develops worsening fever that she needed to be reevaluated quickly.  Today pt reports on day 4/10 of levaquin she feels definitely better.  She is having less cough, less shortness of breath due to less cough fits.  No fever. She feels better overall..felt some better after 24-48 hours.  No confusion. Using the albuterol twice a day.  Review of Systems  Constitutional: Negative for fever and fatigue.  HENT: Negative for ear pain.   Eyes: Negative for pain.  Respiratory: Positive for cough and shortness of breath. Negative for chest tightness.   Cardiovascular: Negative for chest pain, palpitations and leg swelling.  Gastrointestinal: Negative for abdominal pain.  Genitourinary: Negative for dysuria.       Objective:   Physical Exam  Constitutional: She appears well-developed and well-nourished.  Eyes: Conjunctivae normal and EOM are normal. Pupils are equal, round, and reactive to light.  Cardiovascular: Normal rate and regular rhythm.  Exam reveals no distant heart sounds.   Murmur heard. Pulmonary/Chest: Effort normal and breath sounds normal. She has no decreased breath sounds. She has no wheezes. She has no rhonchi. She has no rales.            Assessment & Plan:

## 2012-08-17 NOTE — Patient Instructions (Addendum)
Return to see me in 1 week for pneumonia follow up.  Call if feeling worse, go to ER if shortness of breath.

## 2012-08-17 NOTE — Assessment & Plan Note (Signed)
IMproveing exacerbation.

## 2012-08-17 NOTE — Assessment & Plan Note (Signed)
Improve lung exam and pt feeling better. Complete prednisone and Antibiotics. Follow up in 1 week to make sure improving to resolution.  No indications for hospitalization at this point, but pt aware that if she feels worse she should call ASAP.

## 2012-08-20 ENCOUNTER — Encounter: Payer: Self-pay | Admitting: Cardiology

## 2012-08-20 ENCOUNTER — Ambulatory Visit (INDEPENDENT_AMBULATORY_CARE_PROVIDER_SITE_OTHER): Payer: Medicare HMO | Admitting: Cardiology

## 2012-08-20 VITALS — BP 124/77 | HR 74 | Ht 63.0 in | Wt 101.0 lb

## 2012-08-20 DIAGNOSIS — J189 Pneumonia, unspecified organism: Secondary | ICD-10-CM

## 2012-08-20 DIAGNOSIS — I712 Thoracic aortic aneurysm, without rupture: Secondary | ICD-10-CM

## 2012-08-20 DIAGNOSIS — I10 Essential (primary) hypertension: Secondary | ICD-10-CM

## 2012-08-20 NOTE — Patient Instructions (Addendum)
Your physician recommends that you schedule a follow-up appointment as needed.   Your physician recommends that you continue on your current medications as directed. Please refer to the Current Medication list given to you today.  

## 2012-08-20 NOTE — Assessment & Plan Note (Signed)
Controlled on beta blockers.  Primary treatment for now.

## 2012-08-20 NOTE — Progress Notes (Signed)
HPI:  The patient is in for followup. From a cardiac standpoint she is doing well. She just recently had an upper restaurant fraction was seen multiple times for [facility]. She was given antibiotics, a course of steroids. She now is  quite a bit better.  No chest pain.    Current Outpatient Prescriptions  Medication Sig Dispense Refill  . acetaminophen (TYLENOL) 325 MG tablet Take 325 mg by mouth every 6 (six) hours as needed.        Marland Kitchen albuterol (PROVENTIL HFA;VENTOLIN HFA) 108 (90 BASE) MCG/ACT inhaler Inhale 2 puffs into the lungs every 6 (six) hours as needed for wheezing.  1 Inhaler  3  . aspirin 81 MG tablet Take 162 mg by mouth daily.        . cyanocobalamin (,VITAMIN B-12,) 1000 MCG/ML injection Inject 1 mL (1,000 mcg total) into the muscle every 30 (thirty) days.  1 mL  11  . meclizine (ANTIVERT) 25 MG tablet Take 1 tablet (25 mg total) by mouth as needed.  30 tablet  0  . metoprolol (LOPRESSOR) 50 MG tablet TAKE ONE-HALF TABLET BY MOUTH TWICE DAILY  90 tablet  4  . predniSONE (DELTASONE) 20 MG tablet 1 po daily WILL FINISH TOMMORROW        No Known Allergies  Past Medical History  Diagnosis Date  . Thoracic aortic aneurysm   . Stroke   . Hypertension     Unspecified  . Diverticular disease   . COPD (chronic obstructive pulmonary disease)   . Aortic aneurysm   . Renal calculus   . Anemia   . Dizziness   . Cellulitis and abscess     of trunk  . TIA (transient ischemic attack)   . Carotid bruit     right  . Diplopia   . PUD (peptic ulcer disease)   . Carcinoma, basal cell, skin     Past Surgical History  Procedure Date  . 2-d echo 8/08  . Stomach surgery 1968    X2 Billroth II; Vagotomysubtotal gastric/ gastrectomy (1989)  . Back surgery 1986  . Hernia repair   . Tubal ligation   . Partial hysterectomy     for Fibroids  . Chest ct 10/21/2002    Ascending aneurysm; 5.5 X 5.5  . Upper gi 08/2000    with small bowel follow through  . Carotid doppler  12/1998    Negative  . Basal cell carcinoma excision     Family History  Problem Relation Age of Onset  . Stroke Father 35  . Cholecystitis Mother 70  . Cancer Maternal Aunt     facial CA  . Heart attack Neg Hx   . Other Neg Hx     CV/HBP  . Hypertension Neg Hx     History   Social History  . Marital Status: Married    Spouse Name: N/A    Number of Children: N/A  . Years of Education: N/A   Occupational History  . Retired    Social History Main Topics  . Smoking status: Never Smoker   . Smokeless tobacco: Never Used  . Alcohol Use: No  . Drug Use: No  . Sexually Active: Not on file   Other Topics Concern  . Not on file   Social History Narrative   Lives with husband. 4 children, 2 sons and 2 daughters: Venetia Maxon and Sterling Big live close by.Exercise: minimal due to arthritisDiet: heart healthyLiving Will: Has one  but not sure what is on it.    ROS: Please see the HPI.  All other systems reviewed and negative.  PHYSICAL EXAM:  BP 124/77  Pulse 74  Ht 5\' 3"  (1.6 m)  Wt 101 lb (45.813 kg)  BMI 17.89 kg/m2  General: Elderly female in no distress.   Head:  Normocephalic and atraumatic. Neck: no JVD.  Marked kyphoscoliosis.   Lungs: Clear to auscultation and percussion. Heart: Normal S1 and S2.  1/6 SEM.  No DM.   Pulses: Pulses normal in all 4 extremities. Extremities: No clubbing or cyanosis. No edema. Neurologic: Alert and oriented x 3.  EKG:  NSR.  Non specific T flattening.    ASSESSMENT AND PLAN:

## 2012-08-20 NOTE — Assessment & Plan Note (Signed)
Symptoms improved at the present time.

## 2012-08-20 NOTE — Assessment & Plan Note (Signed)
Not checked in many years.  She has opted for conservative management. Last check at 2004 and 5.5cm.  Continue medical treatment.

## 2012-08-23 ENCOUNTER — Other Ambulatory Visit: Payer: Self-pay | Admitting: *Deleted

## 2012-08-23 ENCOUNTER — Ambulatory Visit: Payer: Medicare HMO | Admitting: Family Medicine

## 2012-08-23 MED ORDER — CYANOCOBALAMIN 1000 MCG/ML IJ SOLN: 1000.0000 ug | INTRAMUSCULAR | Status: DC

## 2012-08-23 NOTE — Telephone Encounter (Signed)
Is this ok to refill? Pt has been seen recently, but hasn't had a b12 level in a while.

## 2012-08-24 ENCOUNTER — Ambulatory Visit: Payer: Medicare HMO | Admitting: Family Medicine

## 2012-08-24 ENCOUNTER — Ambulatory Visit (INDEPENDENT_AMBULATORY_CARE_PROVIDER_SITE_OTHER): Payer: Medicare HMO | Admitting: Family Medicine

## 2012-08-24 ENCOUNTER — Encounter: Payer: Self-pay | Admitting: Family Medicine

## 2012-08-24 VITALS — BP 128/70 | HR 80 | Temp 98.0°F | Wt 103.0 lb

## 2012-08-24 DIAGNOSIS — J189 Pneumonia, unspecified organism: Secondary | ICD-10-CM

## 2012-08-24 DIAGNOSIS — Z23 Encounter for immunization: Secondary | ICD-10-CM

## 2012-08-24 DIAGNOSIS — J449 Chronic obstructive pulmonary disease, unspecified: Secondary | ICD-10-CM

## 2012-08-24 NOTE — Patient Instructions (Addendum)
Schedule medicare wellness in next 3 months, with fasting labs prior. Will also do spirometry at that time to determine if she has COPD.

## 2012-08-24 NOTE — Assessment & Plan Note (Signed)
Will do spirometry at upcoming wellness visit to determine if COPD presents, but she sure did respond well to prednisone. At least mild COPD likely.

## 2012-08-24 NOTE — Assessment & Plan Note (Signed)
Resolved

## 2012-08-24 NOTE — Progress Notes (Signed)
  Subjective:    Patient ID: [name] [name], female    DOB: 1926/05/09, 78 y.o.   MRN: [id number]  HPI 78 year old female recently treated with levaquin and steroids for PNA, COPD exacerbation.  She states today she has completed antibiotics.  She feel 99% better today.  Minimal cough, no SOB, no wheeze.  No fever.    Review of Systems  Constitutional: Negative for fever and fatigue.  HENT: Negative for ear pain.   Eyes: Negative for pain.  Respiratory: Negative for chest tightness and shortness of breath.   Cardiovascular: Negative for chest pain, palpitations and leg swelling.  Gastrointestinal: Negative for abdominal pain.  Genitourinary: Negative for dysuria.       Objective:   Physical Exam  Constitutional: Vital signs are normal. She appears well-developed and well-nourished. She is cooperative.  Non-toxic appearance. She does not appear ill. No distress.  HENT:  Head: Normocephalic.  Right Ear: Hearing, tympanic membrane, external ear and ear canal normal. Tympanic membrane is not erythematous, not retracted and not bulging.  Left Ear: Hearing, tympanic membrane, external ear and ear canal normal. Tympanic membrane is not erythematous, not retracted and not bulging.  Nose: No mucosal edema or rhinorrhea. Right sinus exhibits no maxillary sinus tenderness and no frontal sinus tenderness. Left sinus exhibits no maxillary sinus tenderness and no frontal sinus tenderness.  Mouth/Throat: Uvula is midline, oropharynx is clear and moist and mucous membranes are normal.  Eyes: Conjunctivae normal, EOM and lids are normal. Pupils are equal, round, and reactive to light. No foreign bodies found.  Neck: Trachea normal and normal range of motion. Neck supple. Carotid bruit is not present. No mass and no thyromegaly present.  Cardiovascular: Normal rate, regular rhythm, S1 normal, S2 normal, normal heart sounds, intact distal pulses and normal pulses.  Exam reveals no gallop and no  friction rub.   No murmur heard. Pulmonary/Chest: Effort normal and breath sounds normal. Not tachypneic. No respiratory distress. She has no decreased breath sounds. She has no wheezes. She has no rhonchi. She has no rales.  Abdominal: Normal appearance.  Neurological: She is alert.  Skin: Skin is intact.  Psychiatric: Her speech is normal. Her mood appears not anxious. Cognition and memory are normal. She does not exhibit a depressed mood.          Assessment & Plan:

## 2012-10-30 ENCOUNTER — Other Ambulatory Visit: Payer: Self-pay

## 2012-10-30 MED ORDER — CYANOCOBALAMIN 1000 MCG/ML IJ SOLN: 1000.0000 ug | INTRAMUSCULAR | Status: DC

## 2012-10-30 NOTE — Telephone Encounter (Signed)
[facility] pharmacy request 3 ml x 3 refill sent to [facility] pharmacy per pt request. Advised done.

## 2012-11-01 ENCOUNTER — Other Ambulatory Visit: Payer: Self-pay

## 2012-11-01 NOTE — Telephone Encounter (Signed)
[name] [name] from [facility] called Vit B 12 injectable request was not received; Medication phoned [place] as instructed.spoke with Neysa Bonito at [address] and cancelled refill done 10/30/12.

## 2012-11-05 ENCOUNTER — Encounter: Payer: Self-pay | Admitting: Family Medicine

## 2012-11-05 ENCOUNTER — Ambulatory Visit (INDEPENDENT_AMBULATORY_CARE_PROVIDER_SITE_OTHER): Payer: Medicare HMO | Admitting: Family Medicine

## 2012-11-05 VITALS — BP 120/78 | HR 90 | Temp 97.8°F | Ht 63.0 in | Wt 104.1 lb

## 2012-11-05 DIAGNOSIS — R300 Dysuria: Secondary | ICD-10-CM

## 2012-11-05 DIAGNOSIS — R609 Edema, unspecified: Secondary | ICD-10-CM

## 2012-11-05 DIAGNOSIS — Z79899 Other long term (current) drug therapy: Secondary | ICD-10-CM

## 2012-11-05 DIAGNOSIS — R350 Frequency of micturition: Secondary | ICD-10-CM

## 2012-11-05 DIAGNOSIS — I878 Other specified disorders of veins: Secondary | ICD-10-CM

## 2012-11-05 DIAGNOSIS — I872 Venous insufficiency (chronic) (peripheral): Secondary | ICD-10-CM

## 2012-11-05 LAB — BASIC METABOLIC PANEL
BUN: 17 mg/dL (ref 6–23)
Calcium: 9.4 mg/dL (ref 8.4–10.5)
GFR: 90.57 mL/min (ref >60.00)
Potassium: 4.3 mEq/L (ref 3.5–5.1)
Sodium: 140 mEq/L (ref 135–145)

## 2012-11-05 LAB — HEPATIC FUNCTION PANEL
AST: 21 U/L (ref 0–37)
Total Bilirubin: 0.8 mg/dL (ref 0.3–1.2)

## 2012-11-05 LAB — POCT URINALYSIS DIPSTICK
Glucose, UA: NEGATIVE
Ketones, UA: NEGATIVE
Spec Grav, UA: 1.025
Urobilinogen, UA: NEGATIVE

## 2012-11-05 NOTE — Progress Notes (Signed)
Nature conservation officer at [facility] [address] [place] [address] Phone: [phone] Fax: [phone]  Date:  11/05/2012   Name:  Tezra Mahr Freiman   DOB:  [birth year]   MRN:  [id number] Gender: female Age: 79 y.o.  PCP:  Kerby Nora, MD  Evaluating MD: Hannah Beat, MD   Chief Complaint: Leg Swelling   History of Present Illness:  BRIANAH HOPSON is a 79 y.o. pleasant patient who presents with the following:  Urinary urgency and frequency that has been ongoing for the last couple of weeks, but no incontinence. No real burning.  Also with LE edema. 8/08 TTE with no CHF Last CMP normal Does have some skin change on the outside Mild discomfort  Patient Active Problem List  Diagnosis  . ANEMIA, B12 DEFICIENCY  . Iron deficiency anemia  . HYPERTENSION, UNSPECIFIED  . CVA  . TIA  . THORACIC AORTIC ANEURYSM  . COPD  . DIVERTICULAR DISEASE  . CAROTID BRUIT, RIGHT  . CARCINOMA, BASAL CELL, HX OF  . RENAL CALCULUS, HX OF  . Osteoarthritis of lumbar spine  . Urine frequency  . Pneumonia    Past Medical History  Diagnosis Date  . Thoracic aortic aneurysm   . Stroke   . Hypertension     Unspecified  . Diverticular disease   . COPD (chronic obstructive pulmonary disease)   . Aortic aneurysm   . Renal calculus   . Anemia   . Dizziness   . Cellulitis and abscess     of trunk  . TIA (transient ischemic attack)   . Carotid bruit     right  . Diplopia   . PUD (peptic ulcer disease)   . Carcinoma, basal cell, skin     Past Surgical History  Procedure Date  . 2-d echo 8/08  . Stomach surgery 1968    X2 Billroth II; Vagotomysubtotal gastric/ gastrectomy (1989)  . Back surgery 1986  . Hernia repair   . Tubal ligation   . Partial hysterectomy     for Fibroids  . Chest ct 10/21/2002    Ascending aneurysm; 5.5 X 5.5  . Upper gi 08/2000    with small bowel follow through  . Carotid doppler 12/1998    Negative  . Basal cell carcinoma excision      History  Substance Use Topics  . Smoking status: Never Smoker   . Smokeless tobacco: Never Used  . Alcohol Use: No    Family History  Problem Relation Age of Onset  . Stroke Father 25  . Cholecystitis Mother 54  . Cancer Maternal Aunt     facial CA  . Heart attack Neg Hx   . Other Neg Hx     CV/HBP  . Hypertension Neg Hx     No Known Allergies  Medication list has been reviewed and updated.  Outpatient Prescriptions Prior to Visit  Medication Sig Dispense Refill  . acetaminophen (TYLENOL) 325 MG tablet Take 325 mg by mouth every 6 (six) hours as needed.        Marland Kitchen aspirin 81 MG tablet Take 162 mg by mouth daily.        . cyanocobalamin (,VITAMIN B-12,) 1000 MCG/ML injection Inject 1 mL (1,000 mcg total) into the muscle every 30 (thirty) days.  3 mL  3  . meclizine (ANTIVERT) 25 MG tablet Take 1 tablet (25 mg total) by mouth as needed.  30 tablet  0  . metoprolol (LOPRESSOR) 50 MG tablet  TAKE ONE-HALF TABLET BY MOUTH TWICE DAILY  90 tablet  4  . [DISCONTINUED] albuterol (PROVENTIL HFA;VENTOLIN HFA) 108 (90 BASE) MCG/ACT inhaler Inhale 2 puffs into the lungs every 6 (six) hours as needed for wheezing.  1 Inhaler  3   Last reviewed on 11/05/2012 10:52 AM by Consuello Masse, CMA  Review of Systems:   GEN: No acute illnesses, no fevers, chills. GI: No n/v/d, eating normally Pulm: No SOB Interactive and getting along well at home.  Otherwise, ROS is as per the HPI.   Physical Examination: BP 120/78  Pulse 90  Temp 97.8 F (36.6 C) (Oral)  Ht 5\' 3"  (1.6 m)  Wt 104 lb 2 oz (47.231 kg)  BMI 18.44 kg/m2  SpO2 95%  Ideal Body Weight: Weight in (lb) to have BMI = 25: 140.8    GEN: WDWN, NAD, Non-toxic, A & O x 3 HEENT: Atraumatic, Normocephalic. Neck supple. No masses, No LAD. Ears and Nose: No external deformity. CV: RRR, No M/G/R. No JVD. No thrill. No extra heart sounds. PULM: CTA B, no wheezes, crackles, rhonchi. No retractions. No resp. distress. No  accessory muscle use. EXTR: 1 + LE edema, B LE. Mild reddish pink coloration on distal LE. NEURO Normal gait.  PSYCH: Normally interactive. Conversant. Not depressed or anxious appearing.  Calm demeanor.    Assessment and Plan:  1. Frequent urination  POCT Urinalysis Dipstick, Urine culture  2. Dysuria  Urine culture  3. Venous stasis    4. Edema  Basic metabolic panel, Hepatic function panel  5. Encounter for long-term (current) use of other medications  Basic metabolic panel, Hepatic function panel   Most c/w venous stasis disease. 15 mm Hg hose ordered for her, check bmp and liver Check cx - ? UTI  Results for orders placed in visit on 11/05/12  POCT URINALYSIS DIPSTICK      Component Value Range   Color, UA YELLOW     Clarity, UA CLOUDY     Glucose, UA NEG     Bilirubin, UA NEG     Ketones, UA NEG     Spec Grav, UA 1.025     Blood, UA LARGE     pH, UA 6.0     Protein, UA NEG     Urobilinogen, UA negative     Nitrite, UA NEG     Leukocytes, UA moderate (2+)       Orders Today:  Orders Placed This Encounter  Procedures  . Urine culture  . Basic metabolic panel  . Hepatic function panel  . POCT Urinalysis Dipstick    Updated Medication List: (Includes new medications, updates to list, dose adjustments) No orders of the defined types were placed in this encounter.    Medications Discontinued: Medications Discontinued During This Encounter  Medication Reason  . albuterol (PROVENTIL HFA;VENTOLIN HFA) 108 (90 BASE) MCG/ACT inhaler Error     Hannah Beat, MD

## 2012-11-06 ENCOUNTER — Encounter: Payer: Self-pay | Admitting: *Deleted

## 2012-11-08 LAB — URINE CULTURE: Colony Count: >=100000

## 2012-11-08 MED ORDER — CIPROFLOXACIN HCL 250 MG PO TABS: 250.0000 mg | ORAL_TABLET | Freq: Two times a day (BID) | ORAL | Status: DC

## 2012-11-08 NOTE — Addendum Note (Signed)
Addended by: Consuello Masse on: 11/08/2012 01:33 PM   Modules accepted: Orders

## 2012-12-28 ENCOUNTER — Ambulatory Visit: Payer: Medicare HMO | Admitting: Family Medicine

## 2013-01-14 ENCOUNTER — Encounter: Payer: Self-pay | Admitting: Family Medicine

## 2013-01-14 ENCOUNTER — Encounter (INDEPENDENT_AMBULATORY_CARE_PROVIDER_SITE_OTHER): Payer: Medicare HMO

## 2013-01-14 ENCOUNTER — Ambulatory Visit (INDEPENDENT_AMBULATORY_CARE_PROVIDER_SITE_OTHER)
Admission: RE | Admit: 2013-01-14 | Discharge: 2013-01-14 | Disposition: A | Discharge status: Home / Self Care | Payer: Medicare HMO | Source: Ambulatory Visit | Attending: Family Medicine | Admitting: Family Medicine

## 2013-01-14 ENCOUNTER — Ambulatory Visit (INDEPENDENT_AMBULATORY_CARE_PROVIDER_SITE_OTHER): Payer: Medicare HMO | Admitting: Family Medicine

## 2013-01-14 VITALS — BP 124/74 | HR 82 | Temp 98.0°F | Ht 63.0 in | Wt 109.8 lb

## 2013-01-14 DIAGNOSIS — R600 Localized edema: Secondary | ICD-10-CM

## 2013-01-14 DIAGNOSIS — R609 Edema, unspecified: Secondary | ICD-10-CM

## 2013-01-14 DIAGNOSIS — M7989 Other specified soft tissue disorders: Secondary | ICD-10-CM

## 2013-01-14 DIAGNOSIS — M79609 Pain in unspecified limb: Secondary | ICD-10-CM

## 2013-01-14 LAB — CBC WITH DIFFERENTIAL/PLATELET
Basophils Relative: 0.2 % (ref 0.0–3.0)
Eosinophils Relative: 1.6 % (ref 0.0–5.0)
HCT: 36.9 % (ref 36.0–46.0)
Hemoglobin: 12.3 g/dL (ref 12.0–15.0)
Lymphs Abs: 0.8 10*3/uL (ref 0.7–4.0)
MCV: 93 fl (ref 78.0–100.0)
Monocytes Relative: 6.3 % (ref 3.0–12.0)
Neutro Abs: 6 10*3/uL (ref 1.4–7.7)
Platelets: 373 10*3/uL (ref 150.0–400.0)
RBC: 3.97 Mil/uL (ref 3.87–5.11)
WBC: 7.4 10*3/uL (ref 4.5–10.5)

## 2013-01-14 NOTE — Progress Notes (Signed)
Nature conservation officer at [facility] [address] [place] [address] Phone: [phone] Fax: [phone]  Date:  01/14/2013   Name:  [name] [name]   DOB:  [birth year]   MRN:  [id number] Gender: female Age: 79 y.o.  Primary Physician:  Kerby Nora, MD  Evaluating MD: Hannah Beat, MD   Chief Complaint: pain in right hand   History of Present Illness:  [name] [name] is a 79 y.o. pleasant patient who presents with the following:  79 yo:  R hand has swollen up on the RIGHT side for 1 week.  No trauma or accident.  It is causing her some discomfort. Here with her daughter.  No history of CHF, and last echo reviewed with EF 60%.   No history of DVT or PE, no history of cancer. Meds reviewed. Chart reviewed. Non-smoker.   No significant history of known PVD. Does have a history of TIA.  Patient Active Problem List  Diagnosis  . ANEMIA, B12 DEFICIENCY  . Iron deficiency anemia  . HYPERTENSION, UNSPECIFIED  . TIA  . THORACIC AORTIC ANEURYSM  . COPD  . DIVERTICULAR DISEASE  . CAROTID BRUIT, RIGHT  . CARCINOMA, BASAL CELL, HX OF  . RENAL CALCULUS, HX OF  . Osteoarthritis of lumbar spine    Past Medical History  Diagnosis Date  . Thoracic aortic aneurysm   . Stroke   . Hypertension     Unspecified  . Diverticular disease   . COPD (chronic obstructive pulmonary disease)   . Aortic aneurysm   . Renal calculus   . Anemia   . Dizziness   . Cellulitis and abscess     of trunk  . TIA (transient ischemic attack)   . Carotid bruit     right  . Diplopia   . PUD (peptic ulcer disease)   . Carcinoma, basal cell, skin     Past Surgical History  Procedure Laterality Date  . 2-d echo  8/08  . Stomach surgery  1968    X2 Billroth II; Vagotomysubtotal gastric/ gastrectomy (1989)  . Back surgery  1986  . Hernia repair    . Tubal ligation    . Partial hysterectomy      for Fibroids  . Chest ct  10/21/2002    Ascending aneurysm; 5.5 X 5.5  . Upper gi   08/2000    with small bowel follow through  . Carotid doppler  12/1998    Negative  . Basal cell carcinoma excision      History   Social History  . Marital Status: Married    Spouse Name: N/A    Number of Children: N/A  . Years of Education: N/A   Occupational History  . Retired    Social History Main Topics  . Smoking status: Never Smoker   . Smokeless tobacco: Never Used  . Alcohol Use: No  . Drug Use: No  . Sexually Active: Not on file   Other Topics Concern  . Not on file   Social History Narrative   Lives with husband.    4 children, 2 sons and 2 daughters: [name] [name] and [name] [name] live close by.   Exercise: minimal due to arthritis   Diet: heart healthy   Living Will: Has one but not sure what is on it.                Family History  Problem Relation Age of Onset  . Stroke Father 52  .  Cholecystitis Mother 47  . Cancer Maternal Aunt     facial CA  . Heart attack Neg Hx   . Other Neg Hx     CV/HBP  . Hypertension Neg Hx     No Known Allergies  Medication list has been reviewed and updated.  Outpatient Prescriptions Prior to Visit  Medication Sig Dispense Refill  . acetaminophen (TYLENOL) 325 MG tablet Take 325 mg by mouth every 6 (six) hours as needed.        Marland Kitchen aspirin 81 MG tablet Take 162 mg by mouth daily.        . cyanocobalamin (,VITAMIN B-12,) 1000 MCG/ML injection Inject 1 mL (1,000 mcg total) into the muscle every 30 (thirty) days.  3 mL  3  . meclizine (ANTIVERT) 25 MG tablet Take 1 tablet (25 mg total) by mouth as needed.  30 tablet  0  . metoprolol (LOPRESSOR) 50 MG tablet TAKE ONE-HALF TABLET BY MOUTH TWICE DAILY  90 tablet  4  . ciprofloxacin (CIPRO) 250 MG tablet Take 1 tablet (250 mg total) by mouth 2 (two) times daily.  14 tablet  0   No facility-administered medications prior to visit.    Review of Systems:   GEN: No acute illnesses, no fevers, chills. GI: No n/v/d, eating normally Pulm: No SOB Interactive and  getting along well at home.  Otherwise, ROS is as per the HPI.   Physical Examination: BP 124/74  Pulse 82  Temp(Src) 98 F (36.7 C) (Oral)  Ht 5\' 3"  (1.6 m)  Wt 109 lb 12 oz (49.782 kg)  BMI 19.45 kg/m2  SpO2 93%  Ideal Body Weight: Weight in (lb) to have BMI = 25: 140.8   GEN: WDWN, NAD, Non-toxic, Alert & Oriented x 3 HEENT: Atraumatic, Normocephalic.  Ears and Nose: No external deformity. EXTR: moderate amount of edema on the hand through forearm and mildly tender to palpation, but nontender in the upper arm. NEURO: Normal gait.  PSYCH: Normally interactive. Conversant. Not depressed or anxious appearing.  Calm demeanor.   Dg Chest 2 View  01/14/2013  *RADIOLOGY REPORT*  Clinical Data: Upper extremity edema  CHEST - 2 VIEW  Comparison: August 09, 2012.  Findings: Stable cardiomegaly.  No acute pulmonary disease is noted.  No pneumothorax or pleural effusion is noted.  Thoracic kyphosis is again noted and unchanged.  IMPRESSION: No acute cardiopulmonary abnormality seen.   Original Report Authenticated By: Lupita Raider.,  M.D.     U/S UE, R, 01/14/2013: preliminary report is negative for DVT  Assessment and Plan:  Edema of upper extremity - Plan: Upper extremity Venous Duplex Right, CBC with Differential, DG Chest 2 View  Obtain U/S to evaluate for DVT, which ultimately came back as negative. CXR negative.   This has been improving over the week. I think it is reasonable to watch it over time, elevate, use tylenol and ice as needed. Patient and daughter are agreeable.  I also had my partner Dr. Para March look at the patient's arm, and he agrees.  Results for orders placed in visit on 01/14/13  CBC WITH DIFFERENTIAL      Result Value Range   WBC 7.4  4.5 - 10.5 K/uL   RBC 3.97  3.87 - 5.11 Mil/uL   Hemoglobin 12.3  12.0 - 15.0 g/dL   HCT 21.3  08.6 - 57.8 %   MCV 93.0  78.0 - 100.0 fl   MCHC 33.2  30.0 - 36.0 g/dL   RDW 14.6  11.5 - 14.6 %   Platelets 373.0  150.0 -  400.0 K/uL   Neutrophils Relative 80.9 (*) 43.0 - 77.0 %   Lymphocytes Relative 11.0 (*) 12.0 - 46.0 %   Monocytes Relative 6.3  3.0 - 12.0 %   Eosinophils Relative 1.6  0.0 - 5.0 %   Basophils Relative 0.2  0.0 - 3.0 %   Neutro Abs 6.0  1.4 - 7.7 K/uL   Lymphs Abs 0.8  0.7 - 4.0 K/uL   Monocytes Absolute 0.5  0.1 - 1.0 K/uL   Eosinophils Absolute 0.1  0.0 - 0.7 K/uL   Basophils Absolute 0.0  0.0 - 0.1 K/uL     Orders Today:  Orders Placed This Encounter  Procedures  . DG Chest 2 View    Standing Status: Future     Number of Occurrences: 1     Standing Expiration Date: 03/16/2014    Order Specific Question:  Preferred imaging location?    Answer:  [facility]    Order Specific Question:  Reason for exam:    Answer:  upper extremity swelling, f/u PNA  . CBC with Differential  . Upper extremity Venous Duplex Right    Standing Status: Future     Number of Occurrences: 1     Standing Expiration Date: 01/14/2014    Scheduling Instructions:     eval for potential DVT, R UE, if possible eval R forearm vasc, as well    Order Specific Question:  Laterality    Answer:  Right    Order Specific Question:  Where should this test be performed:    Answer:  [facility]- [address]    Updated Medication List: (Includes new medications, updates to list, dose adjustments) No orders of the defined types were placed in this encounter.    Medications Discontinued: Medications Discontinued During This Encounter  Medication Reason  . ciprofloxacin (CIPRO) 250 MG tablet Error      Signed, Felisha Claytor T. Herman Fiero, MD 01/14/2013 11:07 AM

## 2013-01-14 NOTE — Patient Instructions (Addendum)
REFERRAL: GO THE THE FRONT ROOM AT THE ENTRANCE OF OUR CLINIC, NEAR CHECK IN. ASK FOR MARION. SHE WILL HELP YOU SET UP YOUR REFERRAL. DATE: TIME:  

## 2013-01-21 ENCOUNTER — Ambulatory Visit (INDEPENDENT_AMBULATORY_CARE_PROVIDER_SITE_OTHER): Payer: Medicare HMO | Admitting: Family Medicine

## 2013-01-21 VITALS — BP 130/74 | HR 90 | Temp 98.1°F | Ht 63.0 in

## 2013-01-21 DIAGNOSIS — R609 Edema, unspecified: Secondary | ICD-10-CM

## 2013-01-21 DIAGNOSIS — R0989 Other specified symptoms and signs involving the circulatory and respiratory systems: Secondary | ICD-10-CM

## 2013-01-21 DIAGNOSIS — L539 Erythematous condition, unspecified: Secondary | ICD-10-CM

## 2013-01-21 DIAGNOSIS — R300 Dysuria: Secondary | ICD-10-CM

## 2013-01-21 DIAGNOSIS — R0609 Other forms of dyspnea: Secondary | ICD-10-CM

## 2013-01-21 DIAGNOSIS — R600 Localized edema: Secondary | ICD-10-CM

## 2013-01-21 DIAGNOSIS — J810 Acute pulmonary edema: Secondary | ICD-10-CM

## 2013-01-21 DIAGNOSIS — R0600 Dyspnea, unspecified: Secondary | ICD-10-CM

## 2013-01-21 LAB — CBC WITH DIFFERENTIAL/PLATELET
Basophils Relative: 0 % (ref 0.0–3.0)
Eosinophils Relative: 0.3 % (ref 0.0–5.0)
Hemoglobin: 11.9 g/dL — ABNORMAL LOW (ref 12.0–15.0)
Lymphocytes Relative: 6 % — ABNORMAL LOW (ref 12.0–46.0)
MCV: 93.2 fl (ref 78.0–100.0)
Monocytes Absolute: 0.6 10*3/uL (ref 0.1–1.0)
Neutrophils Relative %: 87.7 % — ABNORMAL HIGH (ref 43.0–77.0)
RBC: 3.91 Mil/uL (ref 3.87–5.11)
WBC: 9.3 10*3/uL (ref 4.5–10.5)

## 2013-01-21 LAB — POCT URINALYSIS DIPSTICK
Ketones, UA: NEGATIVE
Protein, UA: NEGATIVE
Spec Grav, UA: 1.02

## 2013-01-21 LAB — BASIC METABOLIC PANEL
CO2: 28 mEq/L (ref 19–32)
Calcium: 8.8 mg/dL (ref 8.4–10.5)
Creatinine, Ser: 0.7 mg/dL (ref 0.4–1.2)
GFR: 87.46 mL/min (ref >60.00)
Potassium: 3.9 mEq/L (ref 3.5–5.1)

## 2013-01-21 LAB — HEPATIC FUNCTION PANEL
Alkaline Phosphatase: 62 U/L (ref 39–117)
Bilirubin, Direct: 0.1 mg/dL (ref 0.0–0.3)
Total Bilirubin: 0.7 mg/dL (ref 0.3–1.2)

## 2013-01-21 MED ORDER — CEPHALEXIN 500 MG PO CAPS: 1000.0000 mg | ORAL_CAPSULE | Freq: Two times a day (BID) | ORAL | Status: DC

## 2013-01-21 NOTE — Patient Instructions (Addendum)
REFERRAL: GO THE THE FRONT ROOM AT THE ENTRANCE OF OUR CLINIC, NEAR CHECK IN. ASK FOR MARION. SHE WILL HELP YOU SET UP YOUR REFERRAL. DATE: TIME:  

## 2013-01-21 NOTE — Progress Notes (Signed)
Nature conservation officer at [facility] [address] [place] [address] Phone: [phone] Fax: [phone]  Date:  01/21/2013   Name:  [name] [name]   DOB:  27-Apr-1926   MRN:  [id number] Gender: female Age: 79 y.o.  Primary Physician:  Kerby Nora, MD  Evaluating MD: Hannah Beat, MD   Chief Complaint: Edema   History of Present Illness:  [name] [name] is a 79 y.o. pleasant patient who presents with the following:  F/u edema: Left sore foot. Swelling.  I saw the patient approximately one week ago, and at that point her RIGHT hand was swollen, had been swollen for about one week. At that time, primarily was most concerned about potential upper extremity deep vein thrombosis, and got a RIGHT upper extremity ultrasound, and a chest x-ray, both of which were normal and negative.  She presents today, now she has had some improvement in the swelling of her RIGHT upper extremity, but she has some bilateral lower extremity swelling and edema. She also has a medial side LEFT foot and lower extremity redness and warmth. She currently has no fever.  Prior echocardiograms were reviewed, and she did have a normal echocardiogram ejection fraction in 2008, but was diminished in 2004. Other history is as below.  01/14/2013 OV: R hand has swollen up on the RIGHT side for 1 week.   No trauma or accident.  It is causing her some discomfort. Here with her daughter.   No history of CHF, and last echo reviewed with EF 60%.   No history of DVT or PE, no history of cancer. Meds reviewed. Chart reviewed. Non-smoker.   No significant history of known PVD. Does have a history of TIA.   Patient Active Problem List  Diagnosis  . ANEMIA, B12 DEFICIENCY  . Iron deficiency anemia  . HYPERTENSION, UNSPECIFIED  . TIA  . THORACIC AORTIC ANEURYSM  . COPD  . DIVERTICULAR DISEASE  . CAROTID BRUIT, RIGHT  . CARCINOMA, BASAL CELL, HX OF  . RENAL CALCULUS, HX OF  . Osteoarthritis of lumbar  spine    Past Medical History  Diagnosis Date  . Thoracic aortic aneurysm   . Stroke   . Hypertension     Unspecified  . Diverticular disease   . COPD (chronic obstructive pulmonary disease)   . Aortic aneurysm   . Renal calculus   . Anemia   . Dizziness   . Cellulitis and abscess     of trunk  . TIA (transient ischemic attack)   . Carotid bruit     right  . Diplopia   . PUD (peptic ulcer disease)   . Carcinoma, basal cell, skin     Past Surgical History  Procedure Laterality Date  . 2-d echo  8/08  . Stomach surgery  1968    X2 Billroth II; Vagotomysubtotal gastric/ gastrectomy (1989)  . Back surgery  1986  . Hernia repair    . Tubal ligation    . Partial hysterectomy      for Fibroids  . Chest ct  10/21/2002    Ascending aneurysm; 5.5 X 5.5  . Upper gi  08/2000    with small bowel follow through  . Carotid doppler  12/1998    Negative  . Basal cell carcinoma excision      History   Social History  . Marital Status: Married    Spouse Name: N/A    Number of Children: N/A  . Years of Education: N/A  Occupational History  . Retired    Social History Main Topics  . Smoking status: Never Smoker   . Smokeless tobacco: Never Used  . Alcohol Use: No  . Drug Use: No  . Sexually Active: Not on file   Other Topics Concern  . Not on file   Social History Narrative   Lives with husband.    4 children, 2 sons and 2 daughters: Venetia Maxon and Sterling Big live close by.   Exercise: minimal due to arthritis   Diet: heart healthy   Living Will: Has one but not sure what is on it.                Family History  Problem Relation Age of Onset  . Stroke Father 60  . Cholecystitis Mother 15  . Cancer Maternal Aunt     facial CA  . Heart attack Neg Hx   . Other Neg Hx     CV/HBP  . Hypertension Neg Hx     No Known Allergies  Medication list has been reviewed and updated.  Outpatient Prescriptions Prior to Visit  Medication Sig Dispense Refill   . acetaminophen (TYLENOL) 325 MG tablet Take 325 mg by mouth every 6 (six) hours as needed.        Marland Kitchen aspirin 81 MG tablet Take 162 mg by mouth daily.        . cyanocobalamin (,VITAMIN B-12,) 1000 MCG/ML injection Inject 1 mL (1,000 mcg total) into the muscle every 30 (thirty) days.  3 mL  3  . meclizine (ANTIVERT) 25 MG tablet Take 1 tablet (25 mg total) by mouth as needed.  30 tablet  0  . metoprolol (LOPRESSOR) 50 MG tablet TAKE ONE-HALF TABLET BY MOUTH TWICE DAILY  90 tablet  4   No facility-administered medications prior to visit.    Review of Systems:  Edema, no shortness of breath, no fever, no chest pain.  Physical Examination: BP 130/74  Pulse 90  Temp(Src) 98.1 F (36.7 C) (Oral)  Ht 5\' 3"  (1.6 m)  Ideal Body Weight: Weight in (lb) to have BMI = 25: 140.8   GEN: WDWN, NAD, Non-toxic, A & O x 3 HEENT: Atraumatic, Normocephalic. Neck supple. No masses, No LAD. Ears and Nose: No external deformity. CV: 3/6 SEM - loudest r vs l upper sternal border PULM: CTA B, no wheezes, crackles, rhonchi. No retractions. No resp. distress. No accessory muscle use. EXTR: 2+ LE edema. L medial foot redness with warmth. NEURO Normal gait.  PSYCH: Normally interactive. Conversant. Not depressed or anxious appearing.  Calm demeanor.    Assessment and Plan:  Dysuria - Plan: POCT Urinalysis Dipstick, Urine culture: urgency, dysuria, check culture. Inconclusive UA  Dyspnea - Plan: Brain natriuretic peptide, 2D Echocardiogram without contrast  Edema, lower extremity - Plan: CBC with Differential, Basic metabolic panel, Hepatic function panel, 2D Echocardiogram without contrast: unclear etiology. Rule out renal, liver, cardiac causes. H/o abnormal heart valves on echo with distant HF on echo - reassess  Redness - Plan: CBC with Differential  At the time of this note, the patient has normal renal and hepatic function. She does have 88% neutrophils on her CBC, suggesting potential bacterial  infection. I am going to go ahead and start her on Keflex for presumptive cellulitis.  Mildly elevated cardiac BNP. It seems quite reasonable to have a followup echocardiogram given history and current BNP.  Results for orders placed in visit on 01/21/13  CBC WITH DIFFERENTIAL  Result Value Range   WBC 9.3  4.5 - 10.5 K/uL   RBC 3.91  3.87 - 5.11 Mil/uL   Hemoglobin 11.9 (*) 12.0 - 15.0 g/dL   HCT 95.2  84.1 - 32.4 %   MCV 93.2  78.0 - 100.0 fl   MCHC 32.6  30.0 - 36.0 g/dL   RDW 40.1  02.7 - 25.3 %   Platelets 289.0  150.0 - 400.0 K/uL   Neutrophils Relative 87.7 Repeated and verified X2. (*) 43.0 - 77.0 %   Lymphocytes Relative 6.0 Repeated and verified X2. (*) 12.0 - 46.0 %   Monocytes Relative 6.0  3.0 - 12.0 %   Eosinophils Relative 0.3  0.0 - 5.0 %   Basophils Relative 0.0  0.0 - 3.0 %   Neutro Abs 8.2 (*) 1.4 - 7.7 K/uL   Lymphs Abs 0.6 (*) 0.7 - 4.0 K/uL   Monocytes Absolute 0.6  0.1 - 1.0 K/uL   Eosinophils Absolute 0.0  0.0 - 0.7 K/uL   Basophils Absolute 0.0  0.0 - 0.1 K/uL  BASIC METABOLIC PANEL      Result Value Range   Sodium 141  135 - 145 mEq/L   Potassium 3.9  3.5 - 5.1 mEq/L   Chloride 103  96 - 112 mEq/L   CO2 28  19 - 32 mEq/L   Glucose, Bld 96  70 - 99 mg/dL   BUN 10  6 - 23 mg/dL   Creatinine, Ser 0.7  0.4 - 1.2 mg/dL   Calcium 8.8  8.4 - 66.4 mg/dL   GFR 40.34  >74.25 mL/min  HEPATIC FUNCTION PANEL      Result Value Range   Total Bilirubin 0.7  0.3 - 1.2 mg/dL   Bilirubin, Direct 0.1  0.0 - 0.3 mg/dL   Alkaline Phosphatase 62  39 - 117 U/L   AST 11  0 - 37 U/L   ALT 7  0 - 35 U/L   Total Protein 6.5  6.0 - 8.3 g/dL   Albumin 2.7 (*) 3.5 - 5.2 g/dL  BRAIN NATRIURETIC PEPTIDE      Result Value Range   Pro B Natriuretic peptide (BNP) 199.0 (*) 0.0 - 100.0 pg/mL  POCT URINALYSIS DIPSTICK      Result Value Range   Color, UA dark yellow     Clarity, UA clear     Glucose, UA neg     Ketones, UA neg     Spec Grav, UA 1.020     Blood, UA small      pH, UA 6.0     Protein, UA neg     Urobilinogen, UA negative     Nitrite, UA neg     Leukocytes, UA small (1+)       Orders Today:  Orders Placed This Encounter  Procedures  . Urine culture  . CBC with Differential  . Basic metabolic panel  . Hepatic function panel  . Brain natriuretic peptide  . POCT Urinalysis Dipstick  . 2D Echocardiogram without contrast    Standing Status: Future     Number of Occurrences:      Standing Expiration Date: 01/21/2014    Order Specific Question:  Type of Echo    Answer:  Complete    Order Specific Question:  Reason for Exam    Answer:  edema, h/o abnormal TTE, 2008, 2004    Order Specific Question:  Where should this test be performed    Answer:  [facility])    Updated Medication List: (Includes new medications, updates to list, dose adjustments) Meds ordered this encounter  Medications  . cephALEXin (KEFLEX) 500 MG capsule    Sig: Take 2 capsules (1,000 mg total) by mouth 2 (two) times daily.    Dispense:  40 capsule    Refill:  0    Medications Discontinued: There are no discontinued medications.    Signed, Elpidio Galea. Glorious Flicker, MD 01/21/2013 10:43 AM

## 2013-01-22 ENCOUNTER — Encounter: Payer: Self-pay | Admitting: Family Medicine

## 2013-01-23 ENCOUNTER — Other Ambulatory Visit (HOSPITAL_COMMUNITY): Payer: Medicare HMO

## 2013-01-24 ENCOUNTER — Telehealth: Payer: Self-pay

## 2013-01-24 LAB — URINE CULTURE

## 2013-01-24 NOTE — Telephone Encounter (Signed)
[name] [name], pts daughter said pt was scheduled for echocardiogram at Dr Rosalyn Charters office on 01/23/13; Waynetta Sandy cancelled echo appt due to pt having back issues( pt has problem moving without assistance; has problem getting out of house) . [name] [name] will call back and reschedule echo at Dr Rosalyn Charters office when pt is able to go for appt. [name] [name] will notify Dr Patsy Lager when echo is rescheduled. [name] [name] said pt was assigned to Dr Ermalene Searing as her PCP and pt has seen Dr Patsy Lager for last office appts and pt request change PCP from Dr Ermalene Searing to Dr Patsy Lager.Please advise.  [name] [name] request call back.

## 2013-01-24 NOTE — Telephone Encounter (Signed)
Okay to change to Dr. Patsy Lager as PCP

## 2013-01-25 NOTE — Telephone Encounter (Signed)
[name] [name], can you help cancel her echo? Daughter will call cardiology for f/u.  [name] [name], can you switch PCP to me. That is reasonable. I have seen her a majority of time in last couple years.

## 2013-01-30 ENCOUNTER — Telehealth: Payer: Self-pay

## 2013-01-30 MED ORDER — CEPHALEXIN 500 MG PO CAPS: 1000.0000 mg | ORAL_CAPSULE | Freq: Two times a day (BID) | ORAL | Status: DC

## 2013-01-30 NOTE — Telephone Encounter (Signed)
Spoke with patients son and he will pick up meds and echo already scheduled

## 2013-01-30 NOTE — Telephone Encounter (Signed)
pts daughter,[name] said pt seen 01/21/13 started on antibiotic; lt foot swelling is better but still some swelling in lt foot; pt wearing compression hose as instructed. Minimal soreness in lt foot with no noted redness or warmth. Pt will finish antibiotic today and since still swelling in lt foot wants to know if pt should continue antibiotic.[place] Please advise.

## 2013-01-30 NOTE — Telephone Encounter (Signed)
Call  Probably reasonable to extend ABX Keflex 500 mg, 2 tabs po bid, #28, 0 refills  I still recommend f/u echocardiogram

## 2013-02-13 ENCOUNTER — Ambulatory Visit (HOSPITAL_COMMUNITY): Payer: Medicare HMO | Attending: Family Medicine

## 2013-02-13 DIAGNOSIS — I10 Essential (primary) hypertension: Secondary | ICD-10-CM | POA: Insufficient documentation

## 2013-02-13 DIAGNOSIS — R609 Edema, unspecified: Secondary | ICD-10-CM | POA: Insufficient documentation

## 2013-02-13 DIAGNOSIS — R0989 Other specified symptoms and signs involving the circulatory and respiratory systems: Secondary | ICD-10-CM | POA: Insufficient documentation

## 2013-02-13 DIAGNOSIS — R0600 Dyspnea, unspecified: Secondary | ICD-10-CM

## 2013-02-13 DIAGNOSIS — R600 Localized edema: Secondary | ICD-10-CM

## 2013-02-13 DIAGNOSIS — J449 Chronic obstructive pulmonary disease, unspecified: Secondary | ICD-10-CM | POA: Insufficient documentation

## 2013-02-13 DIAGNOSIS — J4489 Other specified chronic obstructive pulmonary disease: Secondary | ICD-10-CM | POA: Insufficient documentation

## 2013-02-13 NOTE — Progress Notes (Signed)
Echocardiogram performed.  

## 2013-02-18 ENCOUNTER — Telehealth: Payer: Self-pay

## 2013-02-18 NOTE — Telephone Encounter (Signed)
Patient's daughter advised.  

## 2013-02-18 NOTE — Telephone Encounter (Signed)
See my note attached to echo report

## 2013-02-18 NOTE — Telephone Encounter (Signed)
Left message for patient's daughter to call me back ?

## 2013-02-18 NOTE — Telephone Encounter (Signed)
pts daughter, Venetia Maxon left v/m requesting call back with echo report done last week.Please advise.

## 2013-03-05 ENCOUNTER — Other Ambulatory Visit: Payer: Self-pay | Admitting: *Deleted

## 2013-03-05 MED ORDER — CYANOCOBALAMIN 1000 MCG/ML IJ SOLN: 1000.0000 ug | INTRAMUSCULAR | Status: DC

## 2013-07-12 ENCOUNTER — Other Ambulatory Visit: Payer: Self-pay

## 2013-07-12 MED ORDER — METOPROLOL TARTRATE 50 MG PO TABS: ORAL_TABLET | ORAL | Status: DC

## 2013-07-12 NOTE — Telephone Encounter (Signed)
[name] [name] pt's daughter wants to know since Dr Riley Kill retired can Dr Patsy Lager refill Metoprolol without pt being seen. [facility] [address].Pt has 4-5 days left of medication. Pt has difficulty getting out of home. Dr Riley Kill was following pt for thoracic aortic aneurysm.Please advise.

## 2013-07-12 NOTE — Telephone Encounter (Signed)
Left message for Daughter that medication refill for Metoprolol was sent to pharmacy but Dr. Patsy Lager would like to see her mom as least once a year for annual check up.

## 2013-07-12 NOTE — Telephone Encounter (Signed)
Ok to refill 90, 3 refills  Ok. I should see her in the office at least once a year for a annual check-up

## 2013-09-05 ENCOUNTER — Ambulatory Visit (INDEPENDENT_AMBULATORY_CARE_PROVIDER_SITE_OTHER): Payer: Medicare HMO

## 2013-09-05 DIAGNOSIS — Z23 Encounter for immunization: Secondary | ICD-10-CM

## 2013-09-17 ENCOUNTER — Ambulatory Visit: Payer: Medicare HMO

## 2013-09-30 ENCOUNTER — Telehealth (INDEPENDENT_AMBULATORY_CARE_PROVIDER_SITE_OTHER): Payer: Medicare HMO | Admitting: Family Medicine

## 2013-09-30 DIAGNOSIS — R300 Dysuria: Secondary | ICD-10-CM

## 2013-09-30 LAB — POCT URINALYSIS DIPSTICK
Bilirubin, UA: NEGATIVE
Glucose, UA: NEGATIVE
Ketones, UA: NEGATIVE
pH, UA: 6

## 2013-09-30 MED ORDER — CIPROFLOXACIN HCL 250 MG PO TABS: 250.0000 mg | ORAL_TABLET | Freq: Two times a day (BID) | ORAL | Status: DC

## 2013-09-30 NOTE — Telephone Encounter (Signed)
[name] [name] notified prescription for Cipro 250 mg has been sent to [facility].

## 2013-09-30 NOTE — Telephone Encounter (Signed)
Urine Dipstick done.  Urine sent for culture per Dr. Patsy Lager.

## 2013-09-30 NOTE — Telephone Encounter (Signed)
Patient Information:  Caller Name: Jasmine December  Phone: [phone]  Patient: [name] [name], [name] [name]  Gender: Female  DOB: 1926/01/06  Age: 79 Years  PCP: Hannah Beat (Family Practice)  Office Follow Up:  Does the office need to follow up with this patient?: Yes  Instructions For The Office: Please call daughter back regarding UTI amd urine sample  RN Note:  Daughter states that mom doesn't want to come to the office because it is too much for her to get out somewhere and she fell over the weekend. Office called and made aware and states that policy is to have pt seen in the office but will send message to Dr Patsy Lager and will call daughter back with further instructions. Daughter aware and will wait to hear back from office  Symptoms  Reason For Call & Symptoms: Daughter calling and states that mom may have a UTI;  sx include itching and burning with urination; no fever;  no discharge; still able to pass urine  Reviewed Health History In EMR: Yes  Reviewed Medications In EMR: Yes  Reviewed Allergies In EMR: Yes  Reviewed Surgeries / Procedures: Yes  Date of Onset of Symptoms: 09/28/2013  Guideline(s) Used:  Urination Pain - Female  Disposition Per Guideline:   See Today in Office  Reason For Disposition Reached:   Age > 50 years  Advice Given:  N/A  Patient Refused Recommendation:  Patient Requests Prescription  Daughter requesting to bring in urine sample

## 2013-09-30 NOTE — Telephone Encounter (Signed)
Let's do urine culture  And we can go ahead and start antibiotics  cipro 250 mg, 1 po bid, #14

## 2013-09-30 NOTE — Addendum Note (Signed)
Addended by: Damita Lack on: 09/30/2013 04:12 PM   Modules accepted: Orders

## 2013-09-30 NOTE — Telephone Encounter (Signed)
As long as I can get a urine sample for sterile culture, i am fine with treating over phone

## 2013-09-30 NOTE — Telephone Encounter (Signed)
Spoke with Jasmine December.  Advised Dr. Patsy Lager is willing for her to drop off urine sample for her mom.  Advised the sample does need to be a clean catch specimen and must be brought to the office within 2 hours after collection or placed in refrigerator.  Instructions on how to do a clean catch sample also given.

## 2013-10-01 LAB — URINE CULTURE: Organism ID, Bacteria: NO GROWTH

## 2014-02-25 ENCOUNTER — Other Ambulatory Visit: Payer: Self-pay | Admitting: Family Medicine

## 2014-02-25 NOTE — Telephone Encounter (Signed)
Last office visit 01/21/2013.  Ok to refill?

## 2014-03-18 ENCOUNTER — Telehealth: Payer: Self-pay | Admitting: Family Medicine

## 2014-03-18 NOTE — Telephone Encounter (Signed)
Generally, this is not a good idea. It is I believe against our practice policy to do such.  In an 80 year old, this is probably appropriate in rare circumstances. Definitely need urine to get a UA and send for culture.  Cc: Dr. Diona Browner

## 2014-03-18 NOTE — Telephone Encounter (Signed)
[name] [name] notified okay to bring urine to office for UA and culture.  [name] [name] states she will bring urine by Wednesday morning.

## 2014-03-18 NOTE — Telephone Encounter (Signed)
Patient has been having urgency when she urinates and burning since Sunday.  Patient's daughter said she's unable to come in for an appointment and is asking if she can bring by a urine sample to be tested.  Please advise.

## 2014-03-19 ENCOUNTER — Ambulatory Visit (INDEPENDENT_AMBULATORY_CARE_PROVIDER_SITE_OTHER): Payer: Medicare HMO | Admitting: *Deleted

## 2014-03-19 DIAGNOSIS — R300 Dysuria: Secondary | ICD-10-CM

## 2014-03-19 LAB — POCT URINALYSIS DIPSTICK
Bilirubin, UA: NEGATIVE
Blood, UA: NEGATIVE
Glucose, UA: NEGATIVE
KETONES UA: NEGATIVE
Leukocytes, UA: NEGATIVE
NITRITE UA: NEGATIVE
PH UA: 6
Protein, UA: NEGATIVE
Spec Grav, UA: 1.02
Urobilinogen, UA: 0.2

## 2014-03-22 LAB — URINE CULTURE: Colony Count: >=100000

## 2014-06-24 ENCOUNTER — Other Ambulatory Visit: Payer: Self-pay | Admitting: Family Medicine

## 2014-06-25 ENCOUNTER — Telehealth: Payer: Self-pay | Admitting: Family Medicine

## 2014-06-25 NOTE — Telephone Encounter (Signed)
Spoke with pt.  Pt stated she will call back to schedule.  She needs to get transporation    Please call and schedule CPE with fasting labs prior with Dr. Lorelei Pont. Needs appointment in order to keep getting her medications refilled.   Thanks,  [name]

## 2014-06-30 NOTE — Telephone Encounter (Signed)
Scheduled 07/21/14 with same day labs per pt.

## 2014-07-21 ENCOUNTER — Ambulatory Visit (INDEPENDENT_AMBULATORY_CARE_PROVIDER_SITE_OTHER): Payer: Medicare PPO | Admitting: Family Medicine

## 2014-07-21 ENCOUNTER — Encounter: Payer: Self-pay | Admitting: Family Medicine

## 2014-07-21 VITALS — BP 132/71 | HR 70 | Temp 97.9°F | Ht 61.0 in | Wt 103.5 lb

## 2014-07-21 DIAGNOSIS — I10 Essential (primary) hypertension: Secondary | ICD-10-CM

## 2014-07-21 DIAGNOSIS — D518 Other vitamin B12 deficiency anemias: Secondary | ICD-10-CM

## 2014-07-21 DIAGNOSIS — R5383 Other fatigue: Secondary | ICD-10-CM

## 2014-07-21 DIAGNOSIS — Z23 Encounter for immunization: Secondary | ICD-10-CM

## 2014-07-21 DIAGNOSIS — Z0000 Encounter for general adult medical examination without abnormal findings: Secondary | ICD-10-CM

## 2014-07-21 DIAGNOSIS — Z13220 Encounter for screening for lipoid disorders: Secondary | ICD-10-CM

## 2014-07-21 DIAGNOSIS — D509 Iron deficiency anemia, unspecified: Secondary | ICD-10-CM

## 2014-07-21 LAB — HEPATIC FUNCTION PANEL
ALBUMIN: 3.3 g/dL — AB (ref 3.5–5.2)
ALK PHOS: 56 U/L (ref 39–117)
ALT: 9 U/L (ref 0–35)
AST: 18 U/L (ref 0–37)
Bilirubin, Direct: 0 mg/dL (ref 0.0–0.3)
TOTAL PROTEIN: 6.7 g/dL (ref 6.0–8.3)
Total Bilirubin: 0.3 mg/dL (ref 0.2–1.2)

## 2014-07-21 LAB — BASIC METABOLIC PANEL
BUN: 16 mg/dL (ref 6–23)
CHLORIDE: 106 meq/L (ref 96–112)
CO2: 24 meq/L (ref 19–32)
Calcium: 8.7 mg/dL (ref 8.4–10.5)
Creatinine, Ser: 0.7 mg/dL (ref 0.4–1.2)
GFR: 90.21 mL/min (ref >60.00)
Glucose, Bld: 76 mg/dL (ref 70–99)
POTASSIUM: 3.7 meq/L (ref 3.5–5.1)
SODIUM: 140 meq/L (ref 135–145)

## 2014-07-21 LAB — CBC WITH DIFFERENTIAL/PLATELET
BASOS PCT: 0.3 % (ref 0.0–3.0)
Basophils Absolute: 0 10*3/uL (ref 0.0–0.1)
EOS PCT: 2.6 % (ref 0.0–5.0)
Eosinophils Absolute: 0.2 10*3/uL (ref 0.0–0.7)
HEMATOCRIT: 35.5 % — AB (ref 36.0–46.0)
HEMOGLOBIN: 11.6 g/dL — AB (ref 12.0–15.0)
Lymphocytes Relative: 10.7 % — ABNORMAL LOW (ref 12.0–46.0)
Lymphs Abs: 0.7 10*3/uL (ref 0.7–4.0)
MCHC: 32.6 g/dL (ref 30.0–36.0)
MCV: 93.9 fl (ref 78.0–100.0)
MONO ABS: 0.3 10*3/uL (ref 0.1–1.0)
Monocytes Relative: 4.6 % (ref 3.0–12.0)
NEUTROS ABS: 5 10*3/uL (ref 1.4–7.7)
Neutrophils Relative %: 81.8 % — ABNORMAL HIGH (ref 43.0–77.0)
Platelets: 211 10*3/uL (ref 150.0–400.0)
RBC: 3.78 Mil/uL — AB (ref 3.87–5.11)
RDW: 14.7 % (ref 11.5–15.5)
WBC: 6.1 10*3/uL (ref 4.0–10.5)

## 2014-07-21 LAB — LIPID PANEL
Cholesterol: 161 mg/dL (ref 0–200)
HDL: 60.8 mg/dL (ref >39.00)
LDL Cholesterol: 88 mg/dL (ref 0–99)
NONHDL: 100.2
Total CHOL/HDL Ratio: 3
Triglycerides: 63 mg/dL (ref 0.0–149.0)
VLDL: 12.6 mg/dL (ref 0.0–40.0)

## 2014-07-21 LAB — TSH: TSH: 3.29 u[IU]/mL (ref 0.35–4.50)

## 2014-07-21 NOTE — Progress Notes (Signed)
Pre visit review using our clinic review tool, if applicable. No additional management support is needed unless otherwise documented below in the visit note. 

## 2014-07-21 NOTE — Progress Notes (Signed)
Dr. Frederico Hamman T. Tameria Patti, MD, [facility] [place], [address] Phone: [phone] Fax: [phone]  07/21/2014  Patient: [name] [name], MRN: [id number], DOB: 1926-03-25, 80 y.o.  Primary Physician:  Owens Loffler, MD  Chief Complaint: Annual Exam  Subjective:   [name] [name] is a 80 y.o. pleasant patient who presents for a medicare wellness examination:  Health Maintenance Summary Reviewed and updated, unless pt declines services.  Tobacco History Reviewed. Non-smoker Alcohol: No concerns, no excessive use Exercise Habits: Some activity, rec at least 30 mins 5 times a week STD concerns: none Drug Use: None Birth control method: n/a Menses regular: n/a Lumps or breast concerns: no Breast Cancer Family History: no  Health Maintenance  Topic Date Due  . Tetanus/tdap  05/04/1947  . Zostavax  05/03/1988  . Pneumococcal Polysaccharide Vaccine Age 26 And Over  05/03/1993  . Influenza Vaccine  05/17/2014   Immunization History  Administered Date(s) Administered  . Influenza Split 08/04/2011, 08/24/2012  . Influenza Whole 08/19/2009, 07/29/2010  . Influenza,inj,Quad PF,36+ Mos 09/05/2013, 07/21/2014  . Pneumococcal Conjugate-13 07/21/2014    Patient Active Problem List   Diagnosis Date Noted  . Osteoarthritis of lumbar spine 10/07/2011  . HYPERTENSION, UNSPECIFIED 01/12/2009  . THORACIC AORTIC ANEURYSM 01/12/2009  . CARCINOMA, BASAL CELL, HX OF 11/07/2008  . RENAL CALCULUS, HX OF 11/07/2008  . ANEMIA, B12 DEFICIENCY 11/04/2008  . Iron deficiency anemia 11/04/2008  . TIA 06/01/2007  . CAROTID BRUIT, RIGHT 05/25/2007  . COPD 10/17/2002  . DIVERTICULAR DISEASE 10/17/1986   Past Medical History  Diagnosis Date  . Thoracic aortic aneurysm   . Stroke   . Hypertension     Unspecified  . Diverticular disease   . COPD (chronic obstructive pulmonary disease)   . Aortic aneurysm   . Renal calculus   .  Anemia   . Dizziness   . Cellulitis and abscess     of trunk  . TIA (transient ischemic attack)   . Carotid bruit     right  . Diplopia   . PUD (peptic ulcer disease)   . Carcinoma, basal cell, skin    Past Surgical History  Procedure Laterality Date  . 2-d echo  8/08  . Stomach surgery  1968    X2 Billroth II; Vagotomysubtotal gastric/ gastrectomy (1989)  . Back surgery  1986  . Hernia repair    . Tubal ligation    . Partial hysterectomy      for Fibroids  . Chest ct  10/21/2002    Ascending aneurysm; 5.5 X 5.5  . Upper gi  08/2000    with small bowel follow through  . Carotid doppler  12/1998    Negative  . Basal cell carcinoma excision     History   Social History  . Marital Status: Married    Spouse Name: N/A    Number of Children: N/A  . Years of Education: N/A   Occupational History  . Retired    Social History Main Topics  . Smoking status: Never Smoker   . Smokeless tobacco: Never Used  . Alcohol Use: No  . Drug Use: No  . Sexual Activity: Not on file   Other Topics Concern  . Not on file   Social History Narrative   Lives with husband.    4 children, 2 sons and 2 daughters: Irving Burton and Dorian Pod live close by.   Exercise: minimal due to  arthritis   Diet: heart healthy   Living Will: Has one but not sure what is on it.               Family History  Problem Relation Age of Onset  . Stroke Father 58  . Cholecystitis Mother 79  . Cancer Maternal Aunt     facial CA  . Heart attack Neg Hx   . Other Neg Hx     CV/HBP  . Hypertension Neg Hx    No Known Allergies  Medication list has been reviewed and updated.   General: Denies fever, chills, sweats. No significant weight loss. Eyes: Denies blurring,significant itching ENT: Denies earache, sore throat, and hoarseness.  Cardiovascular: Denies chest pains, palpitations, dyspnea on exertion,  Respiratory: Denies cough, dyspnea at rest,wheeezing Breast: no concerns about  lumps GI: Denies nausea, vomiting, diarrhea, constipation, change in bowel habits, abdominal pain, melena, hematochezia GU: Denies dysuria, hematuria, urinary hesitancy, nocturia, denies STD risk, no concerns about discharge Musculoskeletal: OCC BACK PAIN Derm: Denies rash, itching Neuro: Denies  paresthesias, frequent falls, frequent headaches Psych: Denies depression, anxiety Endocrine: Denies cold intolerance, heat intolerance, polydipsia Heme: Denies enlarged lymph nodes Allergy: No hayfever  Objective:   BP 132/71  Pulse 70  Temp(Src) 97.9 F (36.6 C) (Oral)  Ht '5\' 1"'  (1.549 m)  Wt 103 lb 8 oz (46.947 kg)  BMI 19.57 kg/m2  SpO2 97%  The patient completed a fall screen and PHQ-2 and PHQ-9 if necessary, which is documented in the EHR. The CMA/LPN/RN who assisted the patient verbally completed with them and documented results in [facility] EHR.   Hearing Screening   Method: Audiometry   '125Hz'  '250Hz'  '500Hz'  '1000Hz'  '2000Hz'  '4000Hz'  '8000Hz'   Right ear:   0 0 0 0   Left ear:   40 0 0 0   Vision Screening Comments: Seen by Dr. Ellie Lunch at [facility] 06/14/2014.  Has had Cataract Surgery on both eyes.  GEN: well developed, well nourished, no acute distress Eyes: conjunctiva and lids normal, PERRLA, EOMI ENT: TM clear, nares clear, oral exam WNL Neck: supple, no lymphadenopathy, no thyromegaly, no JVD Pulm: clear to auscultation and percussion, respiratory effort normal CV: regular rate and rhythm, S1-S2, no murmur, rub or gallop, no bruits Chest: no scars, masses, no lumps BREAST: breast exam declined GI: soft, non-tender; no hepatosplenomegaly, masses; active bowel sounds all quadrants GU: GU exam declined Lymph: no cervical, axillary or inguinal adenopathy MSK: gait normal, muscle tone and strength WNL, no joint swelling, effusions, discoloration, crepitus  SKIN: clear, good turgor, color WNL, no rashes, lesions, or ulcerations Neuro: normal mental status, normal  strength, sensation, and motion Psych: alert; oriented to person, place and time, normally interactive and not anxious or depressed in appearance.  Lipids:    Component Value Date/Time   CHOL 175 10/14/2011 0833   TRIG 69.0 10/14/2011 0833   HDL 68.90 10/14/2011 0833   VLDL 13.8 10/14/2011 0833   CHOLHDL 3 10/14/2011 0833   CBC: CBC Latest Ref Rng 01/21/2013 01/14/2013 10/14/2011  WBC 4.5 - 10.5 K/uL 9.3 7.4 5.5  Hemoglobin 12.0 - 15.0 g/dL 11.9(L) 12.3 13.2  Hematocrit 36.0 - 46.0 % 36.4 36.9 39.7  Platelets 150.0 - 400.0 K/uL 289.0 373.0 176.1    Basic Metabolic Panel:    Component Value Date/Time   NA 141 01/21/2013 1119   NA 143 11/12/2008 1252   K 3.9 01/21/2013 1119   K 4.3 11/12/2008 1252   CL 103 01/21/2013  1119   CL 98 11/12/2008 1252   CO2 28 01/21/2013 1119   CO2 29 11/12/2008 1252   BUN 10 01/21/2013 1119   BUN 11 11/12/2008 1252   CREATININE 0.7 01/21/2013 1119   CREATININE 0.7 11/12/2008 1252   GLUCOSE 96 01/21/2013 1119   GLUCOSE 113 11/12/2008 1252   CALCIUM 8.8 01/21/2013 1119   CALCIUM 9.2 11/12/2008 1252   Hepatic Function Latest Ref Rng 01/21/2013 11/05/2012 10/14/2011  Total Protein 6.0 - 8.3 g/dL 6.5 7.2 7.1  Albumin 3.5 - 5.2 g/dL 2.7(L) 3.7 3.7  AST 0 - 37 U/L '11 21 17  ' ALT 0 - 35 U/L '7 9 9  ' Alk Phosphatase 39 - 117 U/L 62 69 61  Total Bilirubin 0.3 - 1.2 mg/dL 0.7 0.8 0.5  Bilirubin, Direct 0.0 - 0.3 mg/dL 0.1 0.0 -    Lab Results  Component Value Date   TSH 1.57 11/04/2008    No results found.  Assessment and Plan:   Health care maintenance  Iron deficiency anemia - Plan: CBC with Differential  Other fatigue - Plan: Basic metabolic panel, Hepatic function panel, TSH  Screening, lipid - Plan: Lipid panel  Need for prophylactic vaccination against Streptococcus pneumoniae (pneumococcus) - Plan: Pneumococcal conjugate vaccine 13-valent  Need for prophylactic vaccination and inoculation against influenza - Plan: Flu Vaccine QUAD 36+ mos IM  Health  Maintenance Exam: The patient's preventative maintenance and recommended screening tests for an annual wellness exam were reviewed in full today. Brought up to date unless services declined.  Counselled on the importance of diet, exercise, and its role in overall health and mortality. The patient's FH and SH was reviewed, including their home life, tobacco status, and drug and alcohol status.  I have personally reviewed the Medicare Annual Wellness questionnaire and have noted 1. The patient's medical and social history 2. Their use of alcohol, tobacco or illicit drugs 3. Their current medications and supplements 4. The patient's functional ability including ADL's, fall risks, home safety risks and hearing or visual             impairment. 5. Diet and physical activities 6. Evidence for depression or mood disorders  The patients weight, height, BMI and visual acuity have been recorded in the chart I have made referrals, counseling and provided education to the patient based review of the above and I have provided the pt with a written personalized care plan for preventive services.  I have provided the patient with a copy of your personalized plan for preventive services. Instructed to take the time to review along with their updated medication list.  Follow-up: No Follow-up on file. Or follow-up in 1 year for complete physical examination  New Prescriptions   No medications on file   Orders Placed This Encounter  Procedures  . Flu Vaccine QUAD 36+ mos IM  . Pneumococcal conjugate vaccine 13-valent  . Basic metabolic panel  . CBC with Differential  . Hepatic function panel  . Lipid panel  . TSH    Signed,  Frederico Hamman T. Adlene Adduci, MD   Patient's Medications  New Prescriptions   No medications on file  Previous Medications   ACETAMINOPHEN (TYLENOL) 325 MG TABLET    Take 325 mg by mouth every 6 (six) hours as needed.     ASPIRIN 81 MG TABLET    Take 162 mg by mouth daily.      CYANOCOBALAMIN (,VITAMIN B-12,) 1000 MCG/ML INJECTION    INJECT 1 ML (1,000 MCG TOTAL) INTO  THE MUSCLE EVERY  30 (THIRTY) DAYS.   MECLIZINE (ANTIVERT) 25 MG TABLET    Take 1 tablet (25 mg total) by mouth as needed.   METOPROLOL (LOPRESSOR) 50 MG TABLET    TAKE ONE-HALF TABLET BY MOUTH TWICE DAILY  Modified Medications   No medications on file  Discontinued Medications   CEPHALEXIN (KEFLEX) 500 MG CAPSULE    Take 2 capsules (1,000 mg total) by mouth 2 (two) times daily.   CIPROFLOXACIN (CIPRO) 250 MG TABLET    Take 1 tablet (250 mg total) by mouth 2 (two) times daily.

## 2014-07-22 ENCOUNTER — Telehealth: Payer: Self-pay | Admitting: Family Medicine

## 2014-07-22 ENCOUNTER — Encounter: Payer: Self-pay | Admitting: *Deleted

## 2014-07-22 NOTE — Telephone Encounter (Signed)
emmi mailed  °

## 2014-10-01 ENCOUNTER — Other Ambulatory Visit: Payer: Self-pay | Admitting: Family Medicine

## 2015-03-30 ENCOUNTER — Telehealth (INDEPENDENT_AMBULATORY_CARE_PROVIDER_SITE_OTHER): Payer: Medicare PPO

## 2015-03-30 ENCOUNTER — Other Ambulatory Visit: Payer: Self-pay | Admitting: Family Medicine

## 2015-03-30 DIAGNOSIS — R300 Dysuria: Secondary | ICD-10-CM | POA: Diagnosis not present

## 2015-03-30 NOTE — Telephone Encounter (Signed)
Bring urine sample, get UA and urine culture.   Risk of missing different diagnosis weighed against potential pyelo and urosepsis.

## 2015-03-30 NOTE — Telephone Encounter (Signed)
This is against our practice policy and can result in bad outcomes. Recommend face to face evaluation.

## 2015-03-30 NOTE — Telephone Encounter (Signed)
[name] notified as instructed by telephone.  She spoke with  Mrs. Bently who declines an appointment.  [name] states due to her limited mobility, it is just too hard to get her to the office.  She states they are not asking for a narcotic or anything, they are just trying to get her treated for UTI and that there should be certain exceptions to policy.

## 2015-03-30 NOTE — Telephone Encounter (Signed)
[name] [name] called back.. [name] [name] is unable to get another urine sample at this time.  Will try to collect another sample first thing in the morning.  Advised I would be here at 7:15 am in the morning.  Advised to try and get the sample to [place] within a hour of collection and if unable to do that, make sure it gets refrigeratored until able to bring in.

## 2015-03-30 NOTE — Telephone Encounter (Signed)
[name] [name] pts daughter(do not see DPR) left v/m; pt last seen annual exam 07/21/2014. Pt having UTI symptoms,frequency and burning upon urination since 03/27/15,chilling and has not checked temp,no confusion; pt has urine specimen for daughter to bring to [facility]. Pt having pain with arthritis in knee, hand and neck and has limited mobility due to pain and request u/a checked without being seen at office; [name] [name] request cb. [place].

## 2015-03-30 NOTE — Telephone Encounter (Signed)
[name] [name] notified to bring urine by office to be tested per Dr. Lorelei Pont.  [name] [name] that we would need a fresh sample, that the one collected this morning was too old.  Daughter again became upset because I did not tell her this morning that the urine sample her mom collected this morning needed to be refrigerated.  I advised I did not tell her that because Dr. Lorelei Pont was requesting an office visit be scheduled.  She will try and see if her mom can collect another urine sample and will try to get it here before 4:30 pm.

## 2015-03-31 ENCOUNTER — Telehealth: Payer: Self-pay | Admitting: *Deleted

## 2015-03-31 LAB — POCT URINALYSIS DIPSTICK
Bilirubin, UA: NEGATIVE
GLUCOSE UA: NEGATIVE
KETONES UA: NEGATIVE
Nitrite, UA: POSITIVE
UROBILINOGEN UA: 0.2
pH, UA: 6

## 2015-03-31 MED ORDER — NITROFURANTOIN MONOHYD MACRO 100 MG PO CAPS
100.0000 mg | ORAL_CAPSULE | Freq: Two times a day (BID) | ORAL | Status: DC
Start: 2015-03-31 — End: 2015-04-03

## 2015-03-31 MED ORDER — NITROFURANTOIN MONOHYD MACRO 100 MG PO CAPS: 100.0000 mg | ORAL_CAPSULE | Freq: Two times a day (BID) | ORAL | Status: DC

## 2015-03-31 NOTE — Telephone Encounter (Signed)
-----   Message from Owens Loffler, MD sent at 03/31/2015  9:50 AM EDT ----- Probable UTI, please send culture  Macrobid, 1 tablet po bid for 7 days, #14  We will check sensitivity on culture

## 2015-03-31 NOTE — Telephone Encounter (Signed)
Please call patient's daughter and let her know how much urine they should bring in to the office.  Patient was only able to give a small sample this morning.  Please call [name] back at [phone].

## 2015-03-31 NOTE — Telephone Encounter (Signed)
[name] notified prescription for Macrobid has been sent in to [facility] for her mom.

## 2015-03-31 NOTE — Telephone Encounter (Signed)
Spoke with [name].  Advised to go ahead a bring collected urine to office.  Sounds like she collected enough to dip and culture.

## 2015-04-03 ENCOUNTER — Other Ambulatory Visit: Payer: Self-pay | Admitting: *Deleted

## 2015-04-03 LAB — URINE CULTURE

## 2015-04-03 MED ORDER — CIPROFLOXACIN HCL 500 MG PO TABS: 500.0000 mg | ORAL_TABLET | Freq: Two times a day (BID) | ORAL | Status: DC

## 2015-08-10 ENCOUNTER — Encounter: Payer: Self-pay | Admitting: Family Medicine

## 2015-08-10 ENCOUNTER — Ambulatory Visit (INDEPENDENT_AMBULATORY_CARE_PROVIDER_SITE_OTHER): Payer: Medicare PPO | Admitting: Family Medicine

## 2015-08-10 VITALS — BP 127/83 | HR 92 | Temp 98.4°F | Ht 61.0 in | Wt 108.8 lb

## 2015-08-10 DIAGNOSIS — B373 Candidiasis of vulva and vagina: Secondary | ICD-10-CM

## 2015-08-10 DIAGNOSIS — R5383 Other fatigue: Secondary | ICD-10-CM

## 2015-08-10 DIAGNOSIS — R300 Dysuria: Secondary | ICD-10-CM

## 2015-08-10 DIAGNOSIS — E538 Deficiency of other specified B group vitamins: Secondary | ICD-10-CM | POA: Diagnosis not present

## 2015-08-10 DIAGNOSIS — D509 Iron deficiency anemia, unspecified: Secondary | ICD-10-CM | POA: Diagnosis not present

## 2015-08-10 DIAGNOSIS — E785 Hyperlipidemia, unspecified: Secondary | ICD-10-CM

## 2015-08-10 DIAGNOSIS — E441 Mild protein-calorie malnutrition: Secondary | ICD-10-CM | POA: Diagnosis not present

## 2015-08-10 DIAGNOSIS — B3731 Acute candidiasis of vulva and vagina: Secondary | ICD-10-CM

## 2015-08-10 LAB — BASIC METABOLIC PANEL
BUN: 10 mg/dL (ref 6–23)
CO2: 32 mEq/L (ref 19–32)
Calcium: 8.9 mg/dL (ref 8.4–10.5)
Chloride: 103 mEq/L (ref 96–112)
Creatinine, Ser: 0.57 mg/dL (ref 0.40–1.20)
GFR: 106.57 mL/min (ref >60.00)
GLUCOSE: 94 mg/dL (ref 70–99)
POTASSIUM: 4.3 meq/L (ref 3.5–5.1)
Sodium: 144 mEq/L (ref 135–145)

## 2015-08-10 LAB — CBC WITH DIFFERENTIAL/PLATELET
Basophils Absolute: 0 10*3/uL (ref 0.0–0.1)
Basophils Relative: 0.2 % (ref 0.0–3.0)
EOS PCT: 2.4 % (ref 0.0–5.0)
Eosinophils Absolute: 0.2 10*3/uL (ref 0.0–0.7)
HCT: 39.6 % (ref 36.0–46.0)
Hemoglobin: 12.7 g/dL (ref 12.0–15.0)
Lymphocytes Relative: 8.1 % — ABNORMAL LOW (ref 12.0–46.0)
Lymphs Abs: 0.7 10*3/uL (ref 0.7–4.0)
MCHC: 32 g/dL (ref 30.0–36.0)
MCV: 97.3 fl (ref 78.0–100.0)
MONO ABS: 0.6 10*3/uL (ref 0.1–1.0)
MONOS PCT: 6.5 % (ref 3.0–12.0)
NEUTROS ABS: 7.3 10*3/uL (ref 1.4–7.7)
NEUTROS PCT: 82.8 % — AB (ref 43.0–77.0)
PLATELETS: 293 10*3/uL (ref 150.0–400.0)
RBC: 4.07 Mil/uL (ref 3.87–5.11)
RDW: 15.1 % (ref 11.5–15.5)
WBC: 8.8 10*3/uL (ref 4.0–10.5)

## 2015-08-10 LAB — HEPATIC FUNCTION PANEL
ALK PHOS: 74 U/L (ref 39–117)
ALT: 9 U/L (ref 0–35)
AST: 18 U/L (ref 0–37)
Albumin: 3.2 g/dL — ABNORMAL LOW (ref 3.5–5.2)
BILIRUBIN DIRECT: 0.1 mg/dL (ref 0.0–0.3)
BILIRUBIN TOTAL: 0.4 mg/dL (ref 0.2–1.2)
Total Protein: 6 g/dL (ref 6.0–8.3)

## 2015-08-10 LAB — POCT URINALYSIS DIP (MANUAL ENTRY)
Bilirubin, UA: NEGATIVE
Blood, UA: NEGATIVE
GLUCOSE UA: NEGATIVE
Ketones, POC UA: NEGATIVE
Leukocytes, UA: NEGATIVE
Nitrite, UA: NEGATIVE
Protein Ur, POC: NEGATIVE
SPEC GRAV UA: 1.025
Urobilinogen, UA: 0.2
pH, UA: 6

## 2015-08-10 LAB — LIPID PANEL
CHOL/HDL RATIO: 3
Cholesterol: 159 mg/dL (ref 0–200)
HDL: 60.3 mg/dL (ref >39.00)
LDL Cholesterol: 81 mg/dL (ref 0–99)
NONHDL: 99.12
TRIGLYCERIDES: 91 mg/dL (ref 0.0–149.0)
VLDL: 18.2 mg/dL (ref 0.0–40.0)

## 2015-08-10 LAB — VITAMIN B12: Vitamin B-12: 717 pg/mL (ref 211–911)

## 2015-08-10 LAB — TSH: TSH: 4.2 u[IU]/mL (ref 0.35–4.50)

## 2015-08-10 LAB — FERRITIN: Ferritin: 86.1 ng/mL (ref 10.0–291.0)

## 2015-08-10 MED ORDER — CIPROFLOXACIN HCL 250 MG PO TABS: 250.0000 mg | ORAL_TABLET | Freq: Two times a day (BID) | ORAL | Status: DC

## 2015-08-10 MED ORDER — NYSTATIN 100000 UNIT/GM EX CREA
1.0000 | TOPICAL_CREAM | Freq: Two times a day (BID) | CUTANEOUS | Status: DC
Start: 2015-08-10 — End: 2016-05-11

## 2015-08-10 NOTE — Progress Notes (Signed)
Dr. Frederico Hamman T. Ahkeem Goede, MD, [facility] and Sports Medicine [place], [address] Phone: [phone] Fax: [phone]  08/10/2015  Patient: [name] [name], MRN: [id number], DOB: 1926/09/23, 81 y.o.  Primary Physician:  Owens Loffler, MD   Chief Complaint  Patient presents with  . Vaginal Itching  . Burning with Urination    Started taking left over antibiotics on Thursday but can't tell a difference  . Groin Swelling    Vaginal   Subjective:   [name] [name] is a 81 y.o. very pleasant female patient who presents with the following:  The patient started to develop some burning with urination about 5 days ago, she started to take some leftover Macrobid that she had at her house.  This does not seem to have helped her symptoms at all.  She also has some swelling and redness around the exterior of the vagina.  This is painful and burning.  Doing pretty good.  Husband takes 14 pills a day Has an overactive bladder and 9 days ago, had not gone to the bathroom all afternoon  Has been using cortisone in the vagina.  UTI? Swollen, will come and go. Vaginal area.   Took some Macrobid - for 5 or 6 days   Past Medical History, Surgical History, Social History, Family History, Problem List, Medications, and Allergies have been reviewed and updated if relevant.  Patient Active Problem List   Diagnosis Date Noted  . Osteoarthritis of lumbar spine 10/07/2011  . HYPERTENSION, UNSPECIFIED 01/12/2009  . THORACIC AORTIC ANEURYSM 01/12/2009  . CARCINOMA, BASAL CELL, HX OF 11/07/2008  . RENAL CALCULUS, HX OF 11/07/2008  . ANEMIA, B12 DEFICIENCY 11/04/2008  . Iron deficiency anemia 11/04/2008  . TIA 06/01/2007  . CAROTID BRUIT, RIGHT 05/25/2007  . COPD 10/17/2002  . DIVERTICULAR DISEASE 10/17/1986    Past Medical History  Diagnosis Date  . Thoracic aortic aneurysm ([place])   . Stroke ([place])   . Hypertension     Unspecified  . Diverticular  disease   . COPD (chronic obstructive pulmonary disease) ([place])   . Aortic aneurysm ([place])   . Renal calculus   . B12 deficiency anemia     s/p Billroth 2  . TIA (transient ischemic attack)   . Diplopia   . PUD (peptic ulcer disease)   . Carcinoma, basal cell, skin     Past Surgical History  Procedure Laterality Date  . Stomach surgery  1968    X2 Billroth II; Vagotomysubtotal gastric/ gastrectomy (1989)  . Back surgery  1986  . Hernia repair    . Tubal ligation    . Partial hysterectomy      for Fibroids  . Chest ct  10/21/2002    Ascending aneurysm; 5.5 X 5.5  . Basal cell carcinoma excision      Social History   Social History  . Marital Status: Married    Spouse Name: N/A  . Number of Children: N/A  . Years of Education: N/A   Occupational History  . Retired    Social History Main Topics  . Smoking status: Never Smoker   . Smokeless tobacco: Never Used  . Alcohol Use: No  . Drug Use: No  . Sexual Activity: Not on file   Other Topics Concern  . Not on file   Social History Narrative   Lives with husband.    4 children, 2 sons and 2 daughters: Irving Burton and Dorian Pod live  close by.   Exercise: minimal due to arthritis   Diet: heart healthy   Living Will: Has one but not sure what is on it.                Family History  Problem Relation Age of Onset  . Stroke Father 62  . Cholecystitis Mother 52  . Cancer Maternal Aunt     facial CA  . Heart attack Neg Hx   . Other Neg Hx     CV/HBP  . Hypertension Neg Hx     No Known Allergies  Medication list reviewed and updated in full in [place].  ROS: GEN: Acute illness details above GI: Tolerating PO intake GU: maintaining adequate hydration and urination Pulm: No SOB Interactive and getting along well at home.  Otherwise, ROS is as per the HPI.   Objective:   BP 127/83 mmHg  Pulse 92  Temp(Src) 98.4 F (36.9 C) (Oral)  Ht 5\' 1"  (1.549 m)  Wt 108 lb 12 oz (49.329 kg)   BMI 20.56 kg/m2   GEN: WDWN, A&Ox4,NAD. Non-toxic HEENT: Atraumatc, normocephalic. CV: RRR, No M/G/R PULM: CTA B, No wheezes, crackles, or rhonchi ABD: S, NT, ND, +BS, no rebound. No CVAT. No suprapubic tenderness. EXT: No c/c/e  GU: Examination chaperoned. Exteriorly, mildly swollen labia majora with adjacent redness. Linear edge.   Laboratory and Imaging Data: Results for orders placed or performed in visit on 00/93/81  Basic metabolic panel  Result Value Ref Range   Sodium 144 135 - 145 mEq/L   Potassium 4.3 3.5 - 5.1 mEq/L   Chloride 103 96 - 112 mEq/L   CO2 32 19 - 32 mEq/L   Glucose, Bld 94 70 - 99 mg/dL   BUN 10 6 - 23 mg/dL   Creatinine, Ser 0.57 0.40 - 1.20 mg/dL   Calcium 8.9 8.4 - 10.5 mg/dL   GFR 106.57 >60.00 mL/min  CBC with Differential/Platelet  Result Value Ref Range   WBC 8.8 4.0 - 10.5 K/uL   RBC 4.07 3.87 - 5.11 Mil/uL   Hemoglobin 12.7 12.0 - 15.0 g/dL   HCT 39.6 36.0 - 46.0 %   MCV 97.3 78.0 - 100.0 fl   MCHC 32.0 30.0 - 36.0 g/dL   RDW 15.1 11.5 - 15.5 %   Platelets 293.0 150.0 - 400.0 K/uL   Neutrophils Relative % 82.8 (H) 43.0 - 77.0 %   Lymphocytes Relative 8.1 (L) 12.0 - 46.0 %   Monocytes Relative 6.5 3.0 - 12.0 %   Eosinophils Relative 2.4 0.0 - 5.0 %   Basophils Relative 0.2 0.0 - 3.0 %   Neutro Abs 7.3 1.4 - 7.7 K/uL   Lymphs Abs 0.7 0.7 - 4.0 K/uL   Monocytes Absolute 0.6 0.1 - 1.0 K/uL   Eosinophils Absolute 0.2 0.0 - 0.7 K/uL   Basophils Absolute 0.0 0.0 - 0.1 K/uL  Hepatic function panel  Result Value Ref Range   Total Bilirubin 0.4 0.2 - 1.2 mg/dL   Bilirubin, Direct 0.1 0.0 - 0.3 mg/dL   Alkaline Phosphatase 74 39 - 117 U/L   AST 18 0 - 37 U/L   ALT 9 0 - 35 U/L   Total Protein 6.0 6.0 - 8.3 g/dL   Albumin 3.2 (L) 3.5 - 5.2 g/dL  Lipid panel  Result Value Ref Range   Cholesterol 159 0 - 200 mg/dL   Triglycerides 91.0 0.0 - 149.0 mg/dL   HDL 60.30 >39.00 mg/dL   VLDL  18.2 0.0 - 40.0 mg/dL   LDL Cholesterol 81 0 - 99 mg/dL     Total CHOL/HDL Ratio 3    NonHDL 99.12   TSH  Result Value Ref Range   TSH 4.20 0.35 - 4.50 uIU/mL  Vitamin B12  Result Value Ref Range   Vitamin B-12 717 211 - 911 pg/mL  Ferritin  Result Value Ref Range   Ferritin 86.1 10.0 - 291.0 ng/mL  POCT urinalysis dipstick  Result Value Ref Range   Color, UA yellow yellow   Clarity, UA clear clear   Glucose, UA negative negative   Bilirubin, UA negative negative   Ketones, POC UA negative negative   Spec Grav, UA 1.025    Blood, UA negative negative   pH, UA 6.0    Protein Ur, POC negative negative   Urobilinogen, UA 0.2    Nitrite, UA Negative Negative   Leukocytes, UA Negative Negative     Assessment and Plan:   Candidiasis of female genitalia  Burning with urination - Plan: POCT urinalysis dipstick  Protein-calorie malnutrition, mild (HCC)  Iron deficiency anemia - Plan: CBC with Differential/Platelet, Ferritin  B12 deficiency - Plan: CBC with Differential/Platelet, Vitamin B12  Hyperlipidemia - Plan: Lipid panel  Other fatigue - Plan: Basic metabolic panel, Hepatic function panel, TSH  A soon UTI.  Urine will be sterile given that she just took her antibiotics.  Exam is consistent with topical candidiasis, and we'll treat as such with topical nystatin.  If he doesn't improve in a week, then I would have her start some Diflucan.  Her daughter also asked that we order routine bloodwork.   Follow-up: No Follow-up on file.  New Prescriptions   CIPROFLOXACIN (CIPRO) 250 MG TABLET    Take 1 tablet (250 mg total) by mouth 2 (two) times daily.   NYSTATIN CREAM (MYCOSTATIN)    Apply 1 application topically 2 (two) times daily.   Modified Medications   No medications on file   Orders Placed This Encounter  Procedures  . Basic metabolic panel  . CBC with Differential/Platelet  . Hepatic function panel  . Lipid panel  . TSH  . Vitamin B12  . Ferritin  . POCT urinalysis dipstick    Signed,  Frederico Hamman T.  Tedi Hughson, MD   Patient's Medications  New Prescriptions   CIPROFLOXACIN (CIPRO) 250 MG TABLET    Take 1 tablet (250 mg total) by mouth 2 (two) times daily.   NYSTATIN CREAM (MYCOSTATIN)    Apply 1 application topically 2 (two) times daily.  Previous Medications   ACETAMINOPHEN (TYLENOL) 325 MG TABLET    Take 325 mg by mouth every 6 (six) hours as needed.     ASPIRIN 81 MG TABLET    Take 162 mg by mouth daily.     CYANOCOBALAMIN (,VITAMIN B-12,) 1000 MCG/ML INJECTION    INJECT 1 ML (1,000 MCG TOTAL) INTO THE MUSCLE EVERY  30 (THIRTY) DAYS.   MECLIZINE (ANTIVERT) 25 MG TABLET    Take 1 tablet (25 mg total) by mouth as needed.   METOPROLOL (LOPRESSOR) 50 MG TABLET    TAKE ONE-HALF TABLET BY MOUTH TWICE DAILY  Modified Medications   No medications on file  Discontinued Medications   CIPROFLOXACIN (CIPRO) 500 MG TABLET    Take 1 tablet (500 mg total) by mouth 2 (two) times daily.

## 2015-08-10 NOTE — Progress Notes (Signed)
Pre visit review using our clinic review tool, if applicable. No additional management support is needed unless otherwise documented below in the visit note. 

## 2015-08-10 NOTE — Patient Instructions (Signed)
Stop the hydrocortisone cream  Start nystatin cream

## 2015-08-14 ENCOUNTER — Encounter: Payer: Self-pay | Admitting: *Deleted

## 2015-08-14 ENCOUNTER — Telehealth: Payer: Self-pay

## 2015-08-14 NOTE — Telephone Encounter (Signed)
i believe I thought I had already taken care of this. Labs are stable.   Thank-you, Dr. Jacinto Reap.

## 2015-08-14 NOTE — Telephone Encounter (Signed)
[name] [name] notified as instructed by telephone.  Lab numbers given over the phone as well as a copy of results mailed.

## 2015-08-14 NOTE — Telephone Encounter (Signed)
Notify pt and caregiver.. From my review, labs look normal except low protein suggesting malnutrition.  Not sure why no call back earlier.. Will forward to PCP for further recommendations.

## 2015-08-14 NOTE — Telephone Encounter (Signed)
Pt was seen 08/10/15--pt's daughter is requesting call back with lab results---please advise

## 2015-09-18 ENCOUNTER — Other Ambulatory Visit: Payer: Self-pay

## 2015-09-18 MED ORDER — METOPROLOL TARTRATE 50 MG PO TABS: 25.0000 mg | ORAL_TABLET | Freq: Two times a day (BID) | ORAL | Status: DC

## 2015-09-18 NOTE — Telephone Encounter (Signed)
Pt seen 08/10/15 with labs; pt request refill metoprolol to [facility] [address]. Advised pt done and pt will cb to schedule appt before meds are gone.

## 2015-10-29 DIAGNOSIS — H26491 Other secondary cataract, right eye: Secondary | ICD-10-CM | POA: Diagnosis not present

## 2015-11-09 ENCOUNTER — Telehealth: Payer: Self-pay | Admitting: Family Medicine

## 2015-11-09 NOTE — Telephone Encounter (Signed)
[name] called she has questions regarding Pt stated she is getting up and down going to bathroom a lot at night She wanted to know if there was something she could take for this No other uti symptoms

## 2015-11-10 MED ORDER — TOLTERODINE TARTRATE ER 4 MG PO CP24: 4.0000 mg | ORAL_CAPSULE | Freq: Every day | ORAL | Status: DC

## 2015-11-10 NOTE — Telephone Encounter (Signed)
Please check and make sure no burning, urgency, pain, etc.   If none, could try some overactive bladder medication.  detrol LA 4 mg, 1 po daily, #30, 3 ref  Overdue for medicare wellness - f/u 30 min for this in the next few months

## 2015-11-10 NOTE — Telephone Encounter (Signed)
Spoke with [name].  She states her mom is not having any burning, urgency or pain like she normally does when she has a UTI.  She is just getting up a lot at night to urinate.  [name] did state that last night was a little better and that Mrs. Saksa only had to get up twice to go to the bathroom.  They would like to try the Detrol LA.  Prescription sent into [facility] as requested.  [name] will check with her mom about the Medicare Wellness visit and call back to schedule.

## 2015-12-14 ENCOUNTER — Other Ambulatory Visit: Payer: Self-pay | Admitting: Family Medicine

## 2016-02-19 ENCOUNTER — Other Ambulatory Visit: Payer: Self-pay | Admitting: Family Medicine

## 2016-02-19 ENCOUNTER — Telehealth: Payer: Self-pay

## 2016-02-19 ENCOUNTER — Telehealth: Payer: Self-pay | Admitting: Family Medicine

## 2016-02-19 MED ORDER — SOLIFENACIN SUCCINATE 10 MG PO TABS: 10.0000 mg | ORAL_TABLET | Freq: Every day | ORAL | Status: DC

## 2016-02-19 NOTE — Telephone Encounter (Signed)
[name] left v/m; [name] went to pick up vesicare and was advised needed PA for vesicare. [name] request cb; wants to know how long will take to get PA done so med can be picked up.

## 2016-02-19 NOTE — Telephone Encounter (Signed)
[name], pts daughter left v/m(No DPR signed); pt has been taking Detrol for overactive bladder and med is not effective; pt does not have symptoms of UTI.[name] wants to know if pt can take a different dosage of Detrol or can change to different med. Pt is up and down at night urinating. [name] request cb.

## 2016-02-19 NOTE — Telephone Encounter (Signed)
PA completed via telephone.  Information will be sent to [facility] and we will receive a decision in 24 to 72 hours.  G[place] notified.

## 2016-02-19 NOTE — Progress Notes (Signed)
Ok  D/c detrol  Call or escribe to pharmacy of choice  vesicare 10 mg, 1 po daily, 30, 5 ref  If this doesn't work, i would suggest seeing a urologist.

## 2016-02-19 NOTE — Telephone Encounter (Signed)
error 

## 2016-02-19 NOTE — Telephone Encounter (Signed)
Per my notes

## 2016-02-19 NOTE — Telephone Encounter (Signed)
[name] notified to have Ms. Fabel stop the [employer].   Start vesicare 10 mg, 1 po daily.  Prescription sent into [facility]. Advised if this doesn't work, then Dr. Lorelei Pont would suggest seeing a urologist.

## 2016-02-22 MED ORDER — OXYBUTYNIN CHLORIDE 5 MG PO TABS: 5.0000 mg | ORAL_TABLET | Freq: Two times a day (BID) | ORAL | Status: DC

## 2016-02-22 NOTE — Telephone Encounter (Signed)
Oxybutynin 5 mg, 1 po bid, #60, 5 ref  This will be cheapest - she can try to see if the change makes a difference and helps

## 2016-02-22 NOTE — Telephone Encounter (Addendum)
[name] dropped list by that [place] gave her for overactive bladder:  Oxybutunin 5 mg -Tier 2 Toviaz ER 4 mg or 8 mg - Tier 3 Flavoxate HCL 100 mg - Tier 3 Trospium Chloride 20 mg or 60 mg - Tier 4  Or MD may call [place] Review Department  At [phone] and request Tier Exception for the Vesicare which is a Tier 4 now but if moved to Tier 3, it will cost a lot less.  Please advise.

## 2016-02-22 NOTE — Telephone Encounter (Signed)
Spoke with [name] and advised the PA for Vesicare was approved.  She states yes they approved the medication but it was still going to cost $315 out of pocket.  She is going to call the [facility] to see what medication they will cover.  If there is none that is comparable to the cost of Detrol, she states her mom will probably continue taking the Detrol.

## 2016-02-22 NOTE — Addendum Note (Signed)
Addended by: Carter Kitten on: 02/22/2016 02:15 PM   Modules accepted: Orders, Medications

## 2016-02-22 NOTE — Telephone Encounter (Signed)
Oxybutynin 5 mg sent into [facility].  Homer notified.

## 2016-02-22 NOTE — Telephone Encounter (Signed)
[name] [name] was notified that ins will not pay for vesicare and 30 day supply cost to pt is $315.00. [name] [name] will contact ins co to see what med that ins will approve to take the place of detrol and vesicare. [name] [name] will cb with info after contacting ins co. FYI to [name].

## 2016-03-21 ENCOUNTER — Other Ambulatory Visit: Payer: Self-pay | Admitting: Family Medicine

## 2016-03-21 NOTE — Telephone Encounter (Signed)
Last office visit 08/10/2015.  Last CPE 07/21/2014.  Ok to refill?

## 2016-04-14 ENCOUNTER — Other Ambulatory Visit: Payer: Self-pay | Admitting: *Deleted

## 2016-04-14 MED ORDER — OXYBUTYNIN CHLORIDE 5 MG PO TABS: 5.0000 mg | ORAL_TABLET | Freq: Two times a day (BID) | ORAL | Status: DC

## 2016-04-14 MED ORDER — METOPROLOL TARTRATE 50 MG PO TABS: ORAL_TABLET | ORAL | Status: DC

## 2016-05-11 ENCOUNTER — Other Ambulatory Visit: Payer: Self-pay | Admitting: Family Medicine

## 2016-05-12 NOTE — Telephone Encounter (Signed)
Last office visit 08/10/2015.  Last refilled 08/10/2015 for 60 g.  Ok to refill?

## 2016-07-11 ENCOUNTER — Other Ambulatory Visit: Payer: Self-pay | Admitting: Family Medicine

## 2016-07-11 DIAGNOSIS — D509 Iron deficiency anemia, unspecified: Secondary | ICD-10-CM

## 2016-07-11 DIAGNOSIS — R5383 Other fatigue: Secondary | ICD-10-CM

## 2016-07-11 DIAGNOSIS — E785 Hyperlipidemia, unspecified: Secondary | ICD-10-CM

## 2016-07-11 DIAGNOSIS — E559 Vitamin D deficiency, unspecified: Secondary | ICD-10-CM

## 2016-07-11 DIAGNOSIS — E538 Deficiency of other specified B group vitamins: Secondary | ICD-10-CM

## 2016-07-13 ENCOUNTER — Encounter: Payer: Self-pay | Admitting: Family Medicine

## 2016-07-13 ENCOUNTER — Other Ambulatory Visit (INDEPENDENT_AMBULATORY_CARE_PROVIDER_SITE_OTHER): Payer: Commercial Managed Care - HMO

## 2016-07-13 ENCOUNTER — Ambulatory Visit (INDEPENDENT_AMBULATORY_CARE_PROVIDER_SITE_OTHER): Payer: Commercial Managed Care - HMO | Admitting: Family Medicine

## 2016-07-13 VITALS — Temp 97.6°F | Ht 61.0 in | Wt 101.8 lb

## 2016-07-13 DIAGNOSIS — Z0000 Encounter for general adult medical examination without abnormal findings: Secondary | ICD-10-CM | POA: Diagnosis not present

## 2016-07-13 DIAGNOSIS — R5383 Other fatigue: Secondary | ICD-10-CM | POA: Diagnosis not present

## 2016-07-13 DIAGNOSIS — R946 Abnormal results of thyroid function studies: Secondary | ICD-10-CM | POA: Diagnosis not present

## 2016-07-13 DIAGNOSIS — D509 Iron deficiency anemia, unspecified: Secondary | ICD-10-CM | POA: Diagnosis not present

## 2016-07-13 DIAGNOSIS — E559 Vitamin D deficiency, unspecified: Secondary | ICD-10-CM

## 2016-07-13 DIAGNOSIS — Z23 Encounter for immunization: Secondary | ICD-10-CM

## 2016-07-13 DIAGNOSIS — E785 Hyperlipidemia, unspecified: Secondary | ICD-10-CM | POA: Diagnosis not present

## 2016-07-13 DIAGNOSIS — E538 Deficiency of other specified B group vitamins: Secondary | ICD-10-CM | POA: Diagnosis not present

## 2016-07-13 LAB — CBC WITH DIFFERENTIAL/PLATELET
BASOS PCT: 0.4 % (ref 0.0–3.0)
Basophils Absolute: 0 10*3/uL (ref 0.0–0.1)
EOS ABS: 0.2 10*3/uL (ref 0.0–0.7)
EOS PCT: 3.3 % (ref 0.0–5.0)
HCT: 36.3 % (ref 36.0–46.0)
Hemoglobin: 12.1 g/dL (ref 12.0–15.0)
LYMPHS ABS: 0.9 10*3/uL (ref 0.7–4.0)
Lymphocytes Relative: 13.6 % (ref 12.0–46.0)
MCHC: 33.2 g/dL (ref 30.0–36.0)
MCV: 95.7 fl (ref 78.0–100.0)
MONO ABS: 0.3 10*3/uL (ref 0.1–1.0)
Monocytes Relative: 5.5 % (ref 3.0–12.0)
NEUTROS PCT: 77.2 % — AB (ref 43.0–77.0)
Neutro Abs: 4.9 10*3/uL (ref 1.4–7.7)
Platelets: 152 10*3/uL (ref 150.0–400.0)
RBC: 3.79 Mil/uL — ABNORMAL LOW (ref 3.87–5.11)
RDW: 16.4 % — AB (ref 11.5–15.5)
WBC: 6.3 10*3/uL (ref 4.0–10.5)

## 2016-07-13 LAB — HEPATIC FUNCTION PANEL
ALK PHOS: 54 U/L (ref 39–117)
ALT: 7 U/L (ref 0–35)
AST: 16 U/L (ref 0–37)
Albumin: 3.3 g/dL — ABNORMAL LOW (ref 3.5–5.2)
BILIRUBIN DIRECT: 0.1 mg/dL (ref 0.0–0.3)
BILIRUBIN TOTAL: 0.4 mg/dL (ref 0.2–1.2)
TOTAL PROTEIN: 5.9 g/dL — AB (ref 6.0–8.3)

## 2016-07-13 LAB — VITAMIN B12: VITAMIN B 12: 433 pg/mL (ref 211–911)

## 2016-07-13 LAB — LIPID PANEL
CHOLESTEROL: 144 mg/dL (ref 0–200)
HDL: 58.4 mg/dL (ref >39.00)
LDL CALC: 65 mg/dL (ref 0–99)
NonHDL: 85.23
TRIGLYCERIDES: 99 mg/dL (ref 0.0–149.0)
Total CHOL/HDL Ratio: 2
VLDL: 19.8 mg/dL (ref 0.0–40.0)

## 2016-07-13 LAB — BASIC METABOLIC PANEL
BUN: 21 mg/dL (ref 6–23)
CO2: 29 mEq/L (ref 19–32)
CREATININE: 0.8 mg/dL (ref 0.40–1.20)
Calcium: 8.5 mg/dL (ref 8.4–10.5)
Chloride: 110 mEq/L (ref 96–112)
GFR: 71.92 mL/min (ref >60.00)
GLUCOSE: 79 mg/dL (ref 70–99)
POTASSIUM: 4.2 meq/L (ref 3.5–5.1)
Sodium: 145 mEq/L (ref 135–145)

## 2016-07-13 LAB — VITAMIN D 25 HYDROXY (VIT D DEFICIENCY, FRACTURES): VITD: 26.15 ng/mL — ABNORMAL LOW (ref 30.00–100.00)

## 2016-07-13 LAB — T4, FREE: FREE T4: 0.58 ng/dL — AB (ref 0.60–1.60)

## 2016-07-13 LAB — FERRITIN: Ferritin: 49.9 ng/mL (ref 10.0–291.0)

## 2016-07-13 LAB — T3, FREE: T3 FREE: 3.7 pg/mL (ref 2.3–4.2)

## 2016-07-13 LAB — TSH: TSH: 6.02 u[IU]/mL — AB (ref 0.35–4.50)

## 2016-07-13 NOTE — Progress Notes (Signed)
Pre visit review using our clinic review tool, if applicable. No additional management support is needed unless otherwise documented below in the visit note. 

## 2016-07-13 NOTE — Progress Notes (Signed)
Dr. Frederico Hamman T. Shaw Dobek, MD, [facility] [place], [address] Phone: [phone] Fax: [phone]  07/13/2016  Patient: [name] [name], MRN: [id number], DOB: [birth year], 82 y.o.  Primary Physician:  Owens Loffler, MD   Chief Complaint  Patient presents with  . Medicare Wellness   Subjective:   [name] [name] is a 82 y.o. pleasant patient who presents for a medicare wellness examination:  Health Maintenance Summary Reviewed and updated, unless pt declines services.  Tobacco History Reviewed. Non-smoker Alcohol: No concerns, no excessive use Exercise Habits: rare activity, rec at least 30 mins 5 times a week STD concerns: none Drug Use: None Birth control method: n/a Menses regular: n/a Lumps or breast concerns: no Breast Cancer Family History: no  Health Maintenance  Topic Date Due  . TETANUS/TDAP  05[birth year]04/1947  . ZOSTAVAX  05/03/1988  . DEXA SCAN  05/03/1993  . INFLUENZA VACCINE  Completed  . PNA vac Low Risk Adult  Completed   Immunization History  Administered Date(s) Administered  . Influenza Split 08/04/2011, 08/24/2012  . Influenza Whole 08/19/2009, 07/29/2010  . Influenza,inj,Quad PF,36+ Mos 09/05/2013, 07/21/2014, 07/13/2016  . Pneumococcal Conjugate-13 07/21/2014  . Pneumococcal Polysaccharide-23 07/13/2016    Patient Active Problem List   Diagnosis Date Noted  . Osteoarthritis of lumbar spine 10/07/2011  . HYPERTENSION, UNSPECIFIED 01/12/2009  . THORACIC AORTIC ANEURYSM 01/12/2009  . CARCINOMA, BASAL CELL, HX OF 11/07/2008  . RENAL CALCULUS, HX OF 11/07/2008  . ANEMIA, B12 DEFICIENCY 11/04/2008  . Iron deficiency anemia 11/04/2008  . TIA 06/01/2007  . CAROTID BRUIT, RIGHT 05/25/2007  . COPD 10/17/2002  . DIVERTICULAR DISEASE 10/17/1986   Past Medical History:  Diagnosis Date  . Aortic aneurysm ([place])   . B12 deficiency anemia    s/p Billroth 2  . Carcinoma, basal cell, skin   .  COPD (chronic obstructive pulmonary disease) ([place])   . Diplopia   . Diverticular disease   . Hypertension    Unspecified  . PUD (peptic ulcer disease)   . Renal calculus   . Stroke ([place])   . Thoracic aortic aneurysm ([place])   . TIA (transient ischemic attack)    Past Surgical History:  Procedure Laterality Date  . BACK SURGERY  1986  . BASAL CELL CARCINOMA EXCISION    . Chest CT  10/21/2002   Ascending aneurysm; 5.5 X 5.5  . HERNIA REPAIR    . PARTIAL HYSTERECTOMY     for Fibroids  . STOMACH SURGERY  1968   X2 Billroth II; Vagotomysubtotal gastric/ gastrectomy (1989)  . TUBAL LIGATION     Social History   Social History  . Marital status: Married    Spouse name: N/A  . Number of children: N/A  . Years of education: N/A   Occupational History  . Retired    Social History Main Topics  . Smoking status: Never Smoker  . Smokeless tobacco: Never Used  . Alcohol use No  . Drug use: No  . Sexual activity: Not on file   Other Topics Concern  . Not on file   Social History Narrative   Lives with husband.    4 children, 2 sons and 2 daughters: Irving Burton and Dorian Pod live close by.   Exercise: minimal due to arthritis   Diet: heart healthy   Living Will: Has one but not sure what is on it.  Family History  Problem Relation Age of Onset  . Stroke Father 56  . Cholecystitis Mother 34  . Cancer Maternal Aunt     facial CA  . Heart attack Neg Hx   . Other Neg Hx     CV/HBP  . Hypertension Neg Hx    No Known Allergies  Medication list has been reviewed and updated.   General: Denies fever, chills, sweats. No significant weight loss. Eyes: Denies blurring,significant itching ENT: Denies earache, sore throat, and hoarseness.  Cardiovascular: Denies chest pains, palpitations, dyspnea on exertion,  Respiratory: Denies cough, dyspnea at rest,wheeezing Breast: no concerns about lumps GI: Denies nausea, vomiting, diarrhea, constipation, change  in bowel habits, abdominal pain, melena, hematochezia GU: Denies dysuria, hematuria, urinary hesitancy, nocturia, denies STD risk, no concerns about discharge Musculoskeletal: Denies back pain, joint pain Derm: Denies rash, itching Neuro: Denies  paresthesias, frequent falls, frequent headaches Psych: Denies depression, anxiety Endocrine: Denies cold intolerance, heat intolerance, polydipsia Heme: Denies enlarged lymph nodes Allergy: No hayfever  Objective:   Temp 97.6 F (36.4 C) (Oral)   Ht _0  (1.549 m)   Wt 101 lb 12 oz (46.2 kg)   BMI 19.23 kg/m   The patient completed a fall screen and PHQ-2 and PHQ-9 if necessary, which is documented in the EHR. The CMA/LPN/RN who assisted the patient verbally completed with them and documented results in [facility] EHR.   Hearing Screening   Method: Audiometry   _1  _2  _3  _4  _5  _6  _7  _8  _9   Right ear:   0 0 0  0    Left ear:   0 0 0  0    Vision Screening Comments: Wears Glasses-Eye Exam with Dr. Ellie Lunch 10/2015  GEN: well developed, well nourished, no acute distress Eyes: conjunctiva and lids normal, PERRLA, EOMI ENT: TM clear, nares clear, oral exam WNL Neck: supple, no lymphadenopathy, no thyromegaly, no JVD Pulm: clear to auscultation and percussion, respiratory effort normal CV: regular rate and rhythm, S1-S2, no murmur, rub or gallop, no bruits Chest: no scars, masses, no lumps BREAST: breast exam declined GI: soft, non-tender; no hepatosplenomegaly, masses; active bowel sounds all quadrants GU: GU exam declined Lymph: no cervical, axillary or inguinal adenopathy MSK: gait normal, muscle tone and strength WNL, no joint swelling, effusions, discoloration, crepitus  SKIN: clear, good turgor, color WNL, no rashes, lesions, or ulcerations Neuro: normal mental status, normal strength, sensation, and motion Psych: alert; oriented to person, place and time, normally interactive and not anxious or  depressed in appearance.   All labs reviewed with patient. Lipids:    Component Value Date/Time   CHOL 159 08/10/2015 1140   TRIG 91.0 08/10/2015 1140   HDL 60.30 08/10/2015 1140   VLDL 18.2 08/10/2015 1140   CHOLHDL 3 08/10/2015 1140   CBC: CBC Latest Ref Rng & Units 08/10/2015 07/21/2014 01/21/2013  WBC 4.0 - 10.5 K/uL 8.8 6.1 9.3  Hemoglobin 12.0 - 15.0 g/dL 12.7 11.6(L) 11.9(L)  Hematocrit 36.0 - 46.0 % 39.6 35.5(L) 36.4  Platelets 150.0 - 400.0 K/uL 293.0 211.0 619.5    Basic Metabolic Panel:    Component Value Date/Time   NA 144 08/10/2015 1140   NA 143 11/12/2008 1252   K 4.3 08/10/2015 1140   K 4.3 11/12/2008 1252   CL 103 08/10/2015 1140   CL 98 11/12/2008 1252   CO2 32 08/10/2015 1140   CO2 29 11/12/2008 1252   BUN 10 08/10/2015 1140   BUN 11 11/12/2008 1252  CREATININE 0.57 08/10/2015 1140   CREATININE 0.7 11/12/2008 1252   GLUCOSE 94 08/10/2015 1140   GLUCOSE 113 11/12/2008 1252   CALCIUM 8.9 08/10/2015 1140   CALCIUM 9.2 11/12/2008 1252   Hepatic Function Latest Ref Rng & Units 08/10/2015 07/21/2014 01/21/2013  Total Protein 6.0 - 8.3 g/dL 6.0 6.7 6.5  Albumin 3.5 - 5.2 g/dL 3.2(L) 3.3(L) 2.7(L)  AST 0 - 37 U/L _0 ALT 0 - 35 U/L _1 Alk Phosphatase 39 - 117 U/L 74 56 62  Total Bilirubin 0.2 - 1.2 mg/dL 0.4 0.3 0.7  Bilirubin, Direct 0.0 - 0.3 mg/dL 0.1 0.0 0.1    Lab Results  Component Value Date   TSH 4.20 08/10/2015    Assessment and Plan:   Healthcare maintenance  Need for prophylactic vaccination and inoculation against influenza - Plan: Flu Vaccine QUAD 36+ mos IM  Need for prophylactic vaccination against Streptococcus pneumoniae (pneumococcus) - Plan: Pneumococcal polysaccharide vaccine 23-valent greater than or equal to 2yo subcutaneous/IM  Health Maintenance Exam: The patient's preventative maintenance and recommended screening tests for an annual wellness exam were reviewed in full today. Brought up to date unless services  declined.  Counselled on the importance of diet, exercise, and its role in overall health and mortality. The patient's FH and SH was reviewed, including their home life, tobacco status, and drug and alcohol status.  I have personally reviewed the Medicare Annual Wellness questionnaire and have noted 1. The patient's medical and social history 2. Their use of alcohol, tobacco or illicit drugs 3. Their current medications and supplements 4. The patient's functional ability including ADL's, fall risks, home safety risks and hearing or visual             impairment. 5. Diet and physical activities 6. Evidence for depression or mood disorders 7. Reviewed Updated provider list, see scanned forms and [facility] Snapshot.   The patients weight, height, BMI and visual acuity have been recorded in the chart I have made referrals, counseling and provided education to the patient based review of the above and I have provided the pt with a written personalized care plan for preventive services.  I have provided the patient with a copy of your personalized plan for preventive services. Instructed to take the time to review along with their updated medication list.  Follow-up: No Follow-up on file. Or follow-up in 1 year for complete physical examination  Orders Placed This Encounter  Procedures  . Flu Vaccine QUAD 36+ mos IM  . Pneumococcal polysaccharide vaccine 23-valent greater than or equal to 2yo subcutaneous/IM    Signed,  Kryssa Risenhoover T. Bharat Antillon, MD   Patient's Medications  New Prescriptions   No medications on file  Previous Medications   ACETAMINOPHEN (TYLENOL) 325 MG TABLET    Take 325 mg by mouth every 6 (six) hours as needed.     ASPIRIN 81 MG TABLET    Take 162 mg by mouth daily.     CYANOCOBALAMIN (,VITAMIN B-12,) 1000 MCG/ML INJECTION    INJECT 1 ML INTERMUSCLUARLLY ONCE A MONTH.   MECLIZINE (ANTIVERT) 25 MG TABLET    Take 1 tablet (25 mg total) by mouth as needed.   METOPROLOL  (LOPRESSOR) 50 MG TABLET    TAKE ONE-HALF (1/2) OF A TABLET BY MOUTH TWICE DAILY   NYSTATIN CREAM (MYCOSTATIN)    APPLY 1 APPLICATION TOPICALLY 2 (TWO) TIMES DAILY   OXYBUTYNIN (DITROPAN) 5 MG TABLET    Take 1 tablet (5  mg total) by mouth 2 (two) times daily.  Modified Medications   No medications on file  Discontinued Medications   CIPROFLOXACIN (CIPRO) 250 MG TABLET    Take 1 tablet (250 mg total) by mouth 2 (two) times daily.

## 2016-08-01 ENCOUNTER — Telehealth: Payer: Self-pay | Admitting: Family Medicine

## 2016-08-01 NOTE — Telephone Encounter (Signed)
Left message for Eustaquio Maize that a copy of Ms. Pember's recent labs are ready to be picked up at the front desk.

## 2016-08-01 NOTE — Telephone Encounter (Signed)
Daughter beth called, she is wanting to get a copy of pt's last labs. Please call [phone] when ready for pick up Thanks

## 2016-09-23 DIAGNOSIS — H353112 Nonexudative age-related macular degeneration, right eye, intermediate dry stage: Secondary | ICD-10-CM | POA: Diagnosis not present

## 2016-09-23 DIAGNOSIS — H353132 Nonexudative age-related macular degeneration, bilateral, intermediate dry stage: Secondary | ICD-10-CM | POA: Diagnosis not present

## 2016-09-23 DIAGNOSIS — H353122 Nonexudative age-related macular degeneration, left eye, intermediate dry stage: Secondary | ICD-10-CM | POA: Diagnosis not present

## 2016-09-23 DIAGNOSIS — H52203 Unspecified astigmatism, bilateral: Secondary | ICD-10-CM | POA: Diagnosis not present

## 2016-09-27 ENCOUNTER — Other Ambulatory Visit: Payer: Self-pay | Admitting: Family Medicine

## 2016-10-17 DIAGNOSIS — I639 Cerebral infarction, unspecified: Secondary | ICD-10-CM

## 2016-10-17 HISTORY — DX: Cerebral infarction, unspecified: I63.9

## 2016-10-26 ENCOUNTER — Inpatient Hospital Stay (HOSPITAL_COMMUNITY): Payer: Medicare HMO

## 2016-10-26 ENCOUNTER — Telehealth: Payer: Self-pay

## 2016-10-26 ENCOUNTER — Inpatient Hospital Stay (HOSPITAL_COMMUNITY)
Admission: EM | Admit: 2016-10-26 | Discharge: 2016-10-28 | DRG: 064 | Disposition: A | Discharge status: Home / Self Care | Payer: Medicare HMO | Attending: Internal Medicine | Admitting: Internal Medicine

## 2016-10-26 ENCOUNTER — Encounter (HOSPITAL_COMMUNITY): Payer: Self-pay | Admitting: Pharmacy Technician

## 2016-10-26 ENCOUNTER — Emergency Department (HOSPITAL_COMMUNITY): Payer: Medicare HMO

## 2016-10-26 DIAGNOSIS — R4182 Altered mental status, unspecified: Secondary | ICD-10-CM | POA: Diagnosis not present

## 2016-10-26 DIAGNOSIS — R410 Disorientation, unspecified: Secondary | ICD-10-CM | POA: Diagnosis not present

## 2016-10-26 DIAGNOSIS — Z85828 Personal history of other malignant neoplasm of skin: Secondary | ICD-10-CM | POA: Diagnosis not present

## 2016-10-26 DIAGNOSIS — R5383 Other fatigue: Secondary | ICD-10-CM

## 2016-10-26 DIAGNOSIS — J449 Chronic obstructive pulmonary disease, unspecified: Secondary | ICD-10-CM | POA: Diagnosis present

## 2016-10-26 DIAGNOSIS — R1310 Dysphagia, unspecified: Secondary | ICD-10-CM | POA: Diagnosis present

## 2016-10-26 DIAGNOSIS — Z8673 Personal history of transient ischemic attack (TIA), and cerebral infarction without residual deficits: Secondary | ICD-10-CM

## 2016-10-26 DIAGNOSIS — Z7982 Long term (current) use of aspirin: Secondary | ICD-10-CM | POA: Diagnosis not present

## 2016-10-26 DIAGNOSIS — R29810 Facial weakness: Secondary | ICD-10-CM | POA: Diagnosis present

## 2016-10-26 DIAGNOSIS — I712 Thoracic aortic aneurysm, without rupture, unspecified: Secondary | ICD-10-CM | POA: Diagnosis present

## 2016-10-26 DIAGNOSIS — Z8711 Personal history of peptic ulcer disease: Secondary | ICD-10-CM

## 2016-10-26 DIAGNOSIS — N390 Urinary tract infection, site not specified: Secondary | ICD-10-CM | POA: Diagnosis present

## 2016-10-26 DIAGNOSIS — E785 Hyperlipidemia, unspecified: Secondary | ICD-10-CM | POA: Diagnosis present

## 2016-10-26 DIAGNOSIS — Z809 Family history of malignant neoplasm, unspecified: Secondary | ICD-10-CM | POA: Diagnosis not present

## 2016-10-26 DIAGNOSIS — I10 Essential (primary) hypertension: Secondary | ICD-10-CM | POA: Diagnosis present

## 2016-10-26 DIAGNOSIS — R748 Abnormal levels of other serum enzymes: Secondary | ICD-10-CM

## 2016-10-26 DIAGNOSIS — G459 Transient cerebral ischemic attack, unspecified: Secondary | ICD-10-CM

## 2016-10-26 DIAGNOSIS — I639 Cerebral infarction, unspecified: Secondary | ICD-10-CM | POA: Diagnosis not present

## 2016-10-26 DIAGNOSIS — Z681 Body mass index (BMI) 19 or less, adult: Secondary | ICD-10-CM

## 2016-10-26 DIAGNOSIS — Z823 Family history of stroke: Secondary | ICD-10-CM

## 2016-10-26 DIAGNOSIS — R778 Other specified abnormalities of plasma proteins: Secondary | ICD-10-CM | POA: Diagnosis present

## 2016-10-26 DIAGNOSIS — G9340 Encephalopathy, unspecified: Secondary | ICD-10-CM | POA: Diagnosis not present

## 2016-10-26 DIAGNOSIS — R404 Transient alteration of awareness: Secondary | ICD-10-CM | POA: Diagnosis not present

## 2016-10-26 DIAGNOSIS — R269 Unspecified abnormalities of gait and mobility: Secondary | ICD-10-CM

## 2016-10-26 DIAGNOSIS — G9341 Metabolic encephalopathy: Secondary | ICD-10-CM | POA: Diagnosis not present

## 2016-10-26 DIAGNOSIS — E43 Unspecified severe protein-calorie malnutrition: Secondary | ICD-10-CM | POA: Diagnosis not present

## 2016-10-26 DIAGNOSIS — I739 Peripheral vascular disease, unspecified: Secondary | ICD-10-CM | POA: Diagnosis present

## 2016-10-26 DIAGNOSIS — I350 Nonrheumatic aortic (valve) stenosis: Secondary | ICD-10-CM | POA: Diagnosis present

## 2016-10-26 DIAGNOSIS — I248 Other forms of acute ischemic heart disease: Secondary | ICD-10-CM | POA: Diagnosis present

## 2016-10-26 DIAGNOSIS — I63411 Cerebral infarction due to embolism of right middle cerebral artery: Secondary | ICD-10-CM | POA: Diagnosis not present

## 2016-10-26 DIAGNOSIS — I714 Abdominal aortic aneurysm, without rupture: Secondary | ICD-10-CM | POA: Diagnosis present

## 2016-10-26 DIAGNOSIS — Z79899 Other long term (current) drug therapy: Secondary | ICD-10-CM

## 2016-10-26 DIAGNOSIS — I63511 Cerebral infarction due to unspecified occlusion or stenosis of right middle cerebral artery: Secondary | ICD-10-CM | POA: Diagnosis not present

## 2016-10-26 DIAGNOSIS — I6300 Cerebral infarction due to thrombosis of unspecified precerebral artery: Secondary | ICD-10-CM | POA: Diagnosis not present

## 2016-10-26 DIAGNOSIS — D519 Vitamin B12 deficiency anemia, unspecified: Secondary | ICD-10-CM | POA: Diagnosis not present

## 2016-10-26 DIAGNOSIS — Z66 Do not resuscitate: Secondary | ICD-10-CM | POA: Diagnosis present

## 2016-10-26 DIAGNOSIS — R531 Weakness: Secondary | ICD-10-CM | POA: Diagnosis not present

## 2016-10-26 DIAGNOSIS — E039 Hypothyroidism, unspecified: Secondary | ICD-10-CM | POA: Diagnosis present

## 2016-10-26 DIAGNOSIS — F4489 Other dissociative and conversion disorders: Secondary | ICD-10-CM | POA: Diagnosis not present

## 2016-10-26 DIAGNOSIS — R7989 Other specified abnormal findings of blood chemistry: Secondary | ICD-10-CM | POA: Diagnosis present

## 2016-10-26 HISTORY — DX: Cerebral infarction, unspecified: I63.9

## 2016-10-26 LAB — URINALYSIS, ROUTINE W REFLEX MICROSCOPIC
Bilirubin Urine: NEGATIVE
Glucose, UA: NEGATIVE mg/dL
KETONES UR: 5 mg/dL — AB
Nitrite: NEGATIVE
PROTEIN: NEGATIVE mg/dL
Specific Gravity, Urine: 1.018 (ref 1.005–1.030)
pH: 5 (ref 5.0–8.0)

## 2016-10-26 LAB — CBC WITH DIFFERENTIAL/PLATELET
BASOS PCT: 1 %
Basophils Absolute: 0.1 10*3/uL (ref 0.0–0.1)
Eosinophils Absolute: 0.1 10*3/uL (ref 0.0–0.7)
Eosinophils Relative: 1 %
HEMATOCRIT: 36.5 % (ref 36.0–46.0)
HEMOGLOBIN: 12.1 g/dL (ref 12.0–15.0)
Lymphocytes Relative: 15 %
Lymphs Abs: 1 10*3/uL (ref 0.7–4.0)
MCH: 31.6 pg (ref 26.0–34.0)
MCHC: 33.2 g/dL (ref 30.0–36.0)
MCV: 95.3 fL (ref 78.0–100.0)
MONOS PCT: 6 %
Monocytes Absolute: 0.4 10*3/uL (ref 0.1–1.0)
NEUTROS ABS: 5 10*3/uL (ref 1.7–7.7)
NEUTROS PCT: 77 %
Platelets: 211 10*3/uL (ref 150–400)
RBC: 3.83 MIL/uL — ABNORMAL LOW (ref 3.87–5.11)
RDW: 16 % — ABNORMAL HIGH (ref 11.5–15.5)
WBC: 6.5 10*3/uL (ref 4.0–10.5)

## 2016-10-26 LAB — COMPREHENSIVE METABOLIC PANEL
ALBUMIN: 2.6 g/dL — AB (ref 3.5–5.0)
ALK PHOS: 55 U/L (ref 38–126)
ALT: 6 U/L — ABNORMAL LOW (ref 14–54)
ANION GAP: 8 (ref 5–15)
AST: 20 U/L (ref 15–41)
BILIRUBIN TOTAL: 0.6 mg/dL (ref 0.3–1.2)
BUN: 13 mg/dL (ref 6–20)
CALCIUM: 8.4 mg/dL — AB (ref 8.9–10.3)
CO2: 24 mmol/L (ref 22–32)
Chloride: 111 mmol/L (ref 101–111)
Creatinine, Ser: 0.75 mg/dL (ref 0.44–1.00)
GFR calc Af Amer: >60 mL/min (ref >60)
GLUCOSE: 96 mg/dL (ref 65–99)
Potassium: 3.5 mmol/L (ref 3.5–5.1)
Sodium: 143 mmol/L (ref 135–145)
TOTAL PROTEIN: 5.4 g/dL — AB (ref 6.5–8.1)

## 2016-10-26 LAB — I-STAT TROPONIN, ED: Troponin i, poc: 0 ng/mL (ref 0.00–0.08)

## 2016-10-26 MED ORDER — ENOXAPARIN SODIUM 40 MG/0.4ML ~~LOC~~ SOLN
40.0000 mg | SUBCUTANEOUS | Status: DC
  Administered 2016-10-27: 40 mg via SUBCUTANEOUS
  Filled 2016-10-26: qty 0.4

## 2016-10-26 MED ORDER — ACETAMINOPHEN 160 MG/5ML PO SOLN: 650.0000 mg | ORAL | Status: DC | PRN

## 2016-10-26 MED ORDER — STROKE: EARLY STAGES OF RECOVERY BOOK
Freq: Once | Status: DC
  Filled 2016-10-26: qty 1

## 2016-10-26 MED ORDER — ACETAMINOPHEN 325 MG PO TABS
650.0000 mg | ORAL_TABLET | Freq: Once | ORAL | Status: AC
  Administered 2016-10-26: 650 mg via ORAL
  Filled 2016-10-26: qty 2

## 2016-10-26 MED ORDER — OXYBUTYNIN CHLORIDE 5 MG PO TABS
5.0000 mg | ORAL_TABLET | Freq: Two times a day (BID) | ORAL | Status: DC
  Administered 2016-10-27 – 2016-10-28 (×3): 5 mg via ORAL
  Filled 2016-10-26 (×3): qty 1

## 2016-10-26 MED ORDER — ACETAMINOPHEN 650 MG RE SUPP: 650.0000 mg | RECTAL | Status: DC | PRN

## 2016-10-26 MED ORDER — LORAZEPAM 1 MG PO TABS
0.5000 mg | ORAL_TABLET | Freq: Once | ORAL | Status: AC
  Administered 2016-10-26: 0.5 mg via ORAL
  Filled 2016-10-26: qty 1

## 2016-10-26 MED ORDER — SENNOSIDES-DOCUSATE SODIUM 8.6-50 MG PO TABS: 1.0000 | ORAL_TABLET | Freq: Every evening | ORAL | Status: DC | PRN

## 2016-10-26 MED ORDER — LORAZEPAM 2 MG/ML IJ SOLN: 1.0000 mg | Freq: Once | INTRAMUSCULAR | Status: DC

## 2016-10-26 MED ORDER — SODIUM CHLORIDE 0.9 % IV SOLN
INTRAVENOUS | Status: DC
  Administered 2016-10-27 – 2016-10-28 (×2): via INTRAVENOUS

## 2016-10-26 MED ORDER — ACETAMINOPHEN 325 MG PO TABS: 650.0000 mg | ORAL_TABLET | ORAL | Status: DC | PRN

## 2016-10-26 MED ORDER — ASPIRIN EC 81 MG PO TBEC
81.0000 mg | DELAYED_RELEASE_TABLET | Freq: Two times a day (BID) | ORAL | Status: DC
  Administered 2016-10-27: 81 mg via ORAL
  Filled 2016-10-26: qty 1

## 2016-10-26 NOTE — Telephone Encounter (Signed)
Daughter [name] called and said that now patient wants to come to our office and is still not wanting to go the ER.  Spoke with PCP who reiterated the recommendation for ER eval.  Conveyed need to get pt to ER to Daughter, she stated that she appreciated me checking with Dr. Lorelei Pont again, she will get pt to ER.

## 2016-10-26 NOTE — ED Triage Notes (Signed)
Pt arrives to the ED via ems with reports from family\ of confusion and L sided weakness. LKW was yesterday at 1130. Pt with hx of TIA. Pt alert. VSS.

## 2016-10-26 NOTE — Telephone Encounter (Signed)
[name] [name] pts daughter (do not see DPR) left v/m; pt had episode on 10/25/16; family friend said could be TIA or CVA or pt could have blood clot. Pt does not want to go to ED and family wants to honor pts wishes; cannot bring pt to office; having to lift pt from the bed to the bedside commode; pt has lost use of lt side;[name] [name] wants to know if anything can be done in the home such as Home health nurse coming out to do eval or does pt need to be on blood thinner or what to do. [name] [name] request cb. Pt last seen 07/13/16.

## 2016-10-26 NOTE — ED Provider Notes (Signed)
[facility] DEPT Provider Note   CSN: [id number] Arrival date & time: 10/26/16  1620     History   Chief Complaint Chief Complaint  Patient presents with  . Extremity Weakness  . Altered Mental Status    HPI [name] [name] is a 83 y.o. female.  Patient is an 83 year old female with a history of COPD, TIA, thoracic aortic aneurysm presenting today with generalized weakness more pronounced on the left. Family states yesterday morning she was fine and then around lunchtime they started noticing she was weak. Throughout the evening and into today the weakness has become more pronounced. Family states it's worse in the left side. Patient denies any nausea, vomiting, diarrhea, chest pain, abdominal pain, headache or fever.  No recent medication changes. She has been eating some and drinking today without any choking. No aphasia.   The history is provided by the patient and a relative.    Past Medical History:  Diagnosis Date  . Aortic aneurysm ([place])   . B12 deficiency anemia    s/p Billroth 2  . Carcinoma, basal cell, skin   . COPD (chronic obstructive pulmonary disease) ([place])   . Diplopia   . Diverticular disease   . Hypertension    Unspecified  . PUD (peptic ulcer disease)   . Renal calculus   . Stroke ([place])   . Thoracic aortic aneurysm ([place])   . TIA (transient ischemic attack)     Patient Active Problem List   Diagnosis Date Noted  . Osteoarthritis of lumbar spine 10/07/2011  . HYPERTENSION, UNSPECIFIED 01/12/2009  . THORACIC AORTIC ANEURYSM 01/12/2009  . CARCINOMA, BASAL CELL, HX OF 11/07/2008  . RENAL CALCULUS, HX OF 11/07/2008  . ANEMIA, B12 DEFICIENCY 11/04/2008  . Iron deficiency anemia 11/04/2008  . TIA 06/01/2007  . CAROTID BRUIT, RIGHT 05/25/2007  . COPD 10/17/2002  . DIVERTICULAR DISEASE 10/17/1986    Past Surgical History:  Procedure Laterality Date  . BACK SURGERY  1986  . BASAL CELL CARCINOMA EXCISION    . Chest CT  10/21/2002   Ascending  aneurysm; 5.5 X 5.5  . HERNIA REPAIR    . PARTIAL HYSTERECTOMY     for Fibroids  . STOMACH SURGERY  1968   X2 Billroth II; Vagotomysubtotal gastric/ gastrectomy (1989)  . TUBAL LIGATION      OB History    No data available       Home Medications    Prior to Admission medications   Medication Sig Start Date End Date Taking? Authorizing Provider  acetaminophen (TYLENOL) 325 MG tablet Take 325 mg by mouth every 6 (six) hours as needed.      Historical Provider, MD  aspirin 81 MG tablet Take 162 mg by mouth daily.      Historical Provider, MD  cyanocobalamin (,VITAMIN B-12,) 1000 MCG/ML injection INJECT 1 ML INTERMUSCLUARLLY ONCE A MONTH. 12/14/15   Owens Loffler, MD  meclizine (ANTIVERT) 25 MG tablet Take 1 tablet (25 mg total) by mouth as needed. 05/18/12   Amy Cletis Athens, MD  metoprolol (LOPRESSOR) 50 MG tablet TAKE ONE-HALF (1/2) OF A TABLET BY MOUTH TWICE DAILY 04/14/16   Owens Loffler, MD  nystatin cream (MYCOSTATIN) APPLY 1 APPLICATION TOPICALLY 2 (TWO) TIMES DAILY 05/12/16   Owens Loffler, MD  oxybutynin (DITROPAN) 5 MG tablet TAKE 1 TABLET TWICE DAILY 09/28/16   Amy Cletis Athens, MD    Family History Family History  Problem Relation Age of Onset  . Cholecystitis Mother 36  . Stroke Father  91  . Cancer Maternal Aunt     facial CA  . Heart attack Neg Hx   . Other Neg Hx     CV/HBP  . Hypertension Neg Hx     Social History Social History  Substance Use Topics  . Smoking status: Never Smoker  . Smokeless tobacco: Never Used  . Alcohol use No     Allergies   Patient has no known allergies.   Review of Systems Review of Systems  All other systems reviewed and are negative.    Physical Exam Updated Vital Signs BP 115/70 (BP Location: Right Arm)   Pulse 62   Temp 98.3 F (36.8 C) (Oral)   Resp 15   Ht 5\' 5"  (1.651 m)   Wt 100 lb (45.4 kg)   SpO2 98%   BMI 16.64 kg/m   Physical Exam  Constitutional: She is oriented to person, place, and time. She  appears well-developed and well-nourished. No distress.  HENT:  Head: Normocephalic and atraumatic.  Mouth/Throat: Oropharynx is clear and moist.  Eyes: Conjunctivae and EOM are normal. Pupils are equal, round, and reactive to light.  Neck: Normal range of motion. Neck supple.  Cardiovascular: Normal rate, regular rhythm and intact distal pulses.   Murmur heard. Pulmonary/Chest: Effort normal and breath sounds normal. No respiratory distress. She has no wheezes. She has no rales.  Abdominal: Soft. She exhibits no distension. There is no tenderness. There is no rebound and no guarding.  Musculoskeletal: Normal range of motion. She exhibits no edema or tenderness.  Neurological: She is alert and oriented to person, place, and time. A cranial nerve deficit and sensory deficit is present. Coordination normal.  4/5 strength in the left upper extremity with significant pronator drift. 3 out of 5 strength in the left lower extremity. Asymmetric smile. No aphasia  Skin: Skin is warm and dry. No rash noted. No erythema.  Psychiatric: She has a normal mood and affect. Her behavior is normal.  Nursing note and vitals reviewed.    ED Treatments / Results  Labs (all labs ordered are listed, but only abnormal results are displayed) Labs Reviewed  CBC WITH DIFFERENTIAL/PLATELET - Abnormal; Notable for the following:       Result Value   RBC 3.83 (*)    RDW 16.0 (*)    All other components within normal limits  COMPREHENSIVE METABOLIC PANEL - Abnormal; Notable for the following:    Calcium 8.4 (*)    Total Protein 5.4 (*)    Albumin 2.6 (*)    ALT 6 (*)    All other components within normal limits  URINALYSIS, ROUTINE W REFLEX MICROSCOPIC  I-STAT TROPOININ, ED    EKG  EKG Interpretation  Date/Time:  Wednesday October 26 2016 18:18:18 EST Ventricular Rate:  66 PR Interval:    QRS Duration: 99 QT Interval:  385 QTC Calculation: 404 R Axis:   -4 Text Interpretation:  Sinus rhythm  Probable posterior infarct, recent Probable anterior infarct, age indeterminate Baseline wander in lead(s) V2 No significant change since last tracing Confirmed by [facility]  MD, Laquisha Northcraft ([id number]) on 10/26/2016 6:25:12 PM       Radiology Dg Chest 2 View  Result Date: 10/26/2016 CLINICAL DATA:  LEFT side weakness, confusion EXAM: CHEST  2 VIEW COMPARISON:  01/15/2016 FINDINGS: Enlargement of cardiac silhouette. Mediastinal contours and pulmonary vascularity normal. Atherosclerotic calcification aorta. Emphysematous and minimal bronchitic changes question COPD. No acute infiltrate, pleural effusion, or pneumothorax. Marked osseous demineralization. IMPRESSION: Enlargement of  cardiac silhouette. Question COPD changes without acute infiltrate. Aortic atherosclerosis. Electronically Signed   By: Lavonia Dana M.D.   On: 10/26/2016 17:39   Ct Head Wo Contrast  Result Date: 10/26/2016 CLINICAL DATA:  Acute onset left-sided weakness yesterday. EXAM: CT HEAD WITHOUT CONTRAST TECHNIQUE: Contiguous axial images were obtained from the base of the skull through the vertex without intravenous contrast. COMPARISON:  Brain MRI 05/26/2007 and CT 05/25/2007 FINDINGS: Brain: There is no evidence of acute cortical infarct, intracranial hemorrhage, mass, midline shift, or extra-axial fluid collection. There is a small inferior right parietal MCA territory cortical infarct which is new from the prior studies and chronic in appearance. Chronic lacunar infarcts are present in the basal ganglia bilaterally and have increased in the interim. Periventricular white matter hypodensities have also mildly progressed and are compatible with chronic small vessel ischemic disease. There is a new small to moderate-sized right cerebellar PICA territory infarct which appears chronic. A new right thalamic lacunar infarcts is also felt to be chronic. Cerebral atrophy has progressed, most notable in the perisylvian regions. A partially empty sella  is unchanged. Vascular: Calcified atherosclerosis in the carotid siphons. No hyperdense vessel. Skull: No fracture or focal osseous lesion. Sinuses/Orbits: Paranasal sinuses and mastoid air cells are clear. Prior bilateral cataract extraction. Other: None. IMPRESSION: 1. No evidence of acute intracranial abnormality. 2. Interval chronic infarcts in the right parietal lobe, right cerebellum, bilateral basal ganglia, and right thalamus. Electronically Signed   By: Logan Bores M.D.   On: 10/26/2016 18:11    Procedures Procedures (including critical care time)  Medications Ordered in ED Medications - No data to display   Initial Impression / Assessment and Plan / ED Course  I have reviewed the triage vital signs and the nursing notes.  Pertinent labs & imaging results that were available during my care of the patient were reviewed by me and considered in my medical decision making (see chart for details).  Clinical Course    Patient is an elderly female presenting today with new onset weakness that started yesterday around lunchtime. It is pronounced in the left side face, arm and leg. She said no infectious symptoms, medication changes, falls or trauma to the head. She takes no anticoagulation. Prior history of TIA but no complete stroke in the past. Patient has no complaints of pain denies any chest symptoms. Low suspicion for dissection, infection or medication related. Concern for stroke however patient is outside of the window because symptoms started yesterday at 11 AM. Stroke workup initiated. Currently hemodynamically stable, protecting her airway, no aphasia or swallowing difficulties. 7:06 PM Labs without significant findings. EKG without signs of atrial fibrillation. CT of the head showing new infarct since last CT but unclear if it is chronic. Pt still having significant left sided weakness.  Discussed patient with neurology patient will be admitted for stroke workup. Final Clinical  Impressions(s) / ED Diagnoses   Final diagnoses:  Cerebrovascular accident (CVA), unspecified mechanism ([place])    New Prescriptions New Prescriptions   No medications on file     Blanchie Dessert, MD 10/26/16 1923

## 2016-10-26 NOTE — H&P (Addendum)
History and Physical    [name] [name] [id number] DOB: 1926-02-01 DOA: 10/26/2016  PCP: Owens Loffler, MD Consultants:  Cardiologist retired Patient coming from: home - lives with husband; daughters live next door; NOK: daughter, [name], [phone]  Chief Complaint: possible stroke  HPI: [name] [name] is a 83 y.o. female with medical history significant of remote TIA, HTN, COPD, and AAA presenting with possible CVA.  Yesterday, she had gradual symptoms.  Was not her usual self.  Daughter realized that things weren't right.  +confusion, difficulty getting things straight.  She kept asking to go home, "sounded like something coming out of an old person's head", she was already at home.  Increasing confusion.  Left arm and leg weakness that started yesterday.  Slight left facial droop, subtle.  ?slight slurring vs. Weakness.  +dysphagia for up to a week, trouble swallowing "heavier foods."    Has chronic headaches.     ED Course: Per Dr. Maryan Rued: Patient is an elderly female presenting today with new onset weakness that started yesterday around lunchtime. It is pronounced in the left side face, arm and leg. She said no infectious symptoms, medication changes, falls or trauma to the head. She takes no anticoagulation. Prior history of TIA but no complete stroke in the past. Patient has no complaints of pain denies any chest symptoms. Low suspicion for dissection, infection or medication related. Concern for stroke however patient is outside of the window because symptoms started yesterday at 11 AM.  Stroke workup initiated. Currently hemodynamically stable, protecting her airway, no aphasia or swallowing difficulties.  7:06 PM  Labs without significant findings. EKG without signs of atrial fibrillation. CT of the head showing new infarct since last CT but unclear if it is chronic. Pt still having significant left sided weakness. Discussed patient with neurology patient will be admitted for  stroke workup.   Review of Systems: As per HPI; otherwise 10 point review of systems reviewed and negative.   Ambulatory Status:  Ambulates indepednently although probably needs cane/walker  Past Medical History:  Diagnosis Date  . Aortic aneurysm ([place])   . B12 deficiency anemia    s/p Billroth 2  . Carcinoma, basal cell, skin   . COPD (chronic obstructive pulmonary disease) ([place])   . Diplopia   . Diverticular disease   . Hypertension    Unspecified  . PUD (peptic ulcer disease)   . Renal calculus   . Thoracic aortic aneurysm ([facility])    7 cm?  . TIA (transient ischemic attack) 2010    Past Surgical History:  Procedure Laterality Date  . BACK SURGERY  1986  . BASAL CELL CARCINOMA EXCISION    . Chest CT  10/21/2002   Ascending aneurysm; 5.5 X 5.5  . HERNIA REPAIR    . PARTIAL HYSTERECTOMY     for Fibroids  . STOMACH SURGERY  1968   X2 Billroth II; Vagotomysubtotal gastric/ gastrectomy (1989)  . TUBAL LIGATION      Social History   Social History  . Marital status: Married    Spouse name: N/A  . Number of children: N/A  . Years of education: N/A   Occupational History  . Retired    Social History Main Topics  . Smoking status: Never Smoker  . Smokeless tobacco: Never Used  . Alcohol use No  . Drug use: No  . Sexual activity: Not on file   Other Topics Concern  . Not on file   Social History Narrative   Lives  with husband.    4 children, 2 sons and 2 daughters: Irving Burton and Dorian Pod live close by.   Exercise: minimal due to arthritis   Diet: heart healthy   Living Will: Has one but not sure what is on it.                No Known Allergies  Family History  Problem Relation Age of Onset  . Cholecystitis Mother 63  . Stroke Father 30  . Cancer Maternal Aunt     facial CA  . Heart attack Neg Hx   . Other Neg Hx     CV/HBP  . Hypertension Neg Hx     Prior to Admission medications   Medication Sig Start Date End Date Taking?  Authorizing Provider  aspirin 81 MG tablet Take 81 mg by mouth 2 (two) times daily.    Yes Historical Provider, MD  Aspirin-Caffeine (ANACIN) 400-32 MG TABS Take 2 tablets by mouth 2 (two) times daily.   Yes Historical Provider, MD  cyanocobalamin (,VITAMIN B-12,) 1000 MCG/ML injection INJECT 1 ML INTERMUSCLUARLLY ONCE A MONTH. 12/14/15  Yes Spencer Copland, MD  metoprolol (LOPRESSOR) 50 MG tablet TAKE ONE-HALF (1/2) OF A TABLET BY MOUTH TWICE DAILY 04/14/16  Yes Owens Loffler, MD  Multiple Vitamin (MULTIVITAMIN WITH MINERALS) TABS tablet Take 1 tablet by mouth daily.   Yes Historical Provider, MD  Multiple Vitamins-Minerals (PRESERVISION AREDS 2) CAPS Take 1 capsule by mouth 2 (two) times daily.   Yes Historical Provider, MD  nystatin cream (MYCOSTATIN) APPLY 1 APPLICATION TOPICALLY 2 (TWO) TIMES DAILY Patient taking differently: Apply 1 application topically 2 (two) times daily as needed.  05/12/16  Yes Owens Loffler, MD  oxybutynin (DITROPAN) 5 MG tablet TAKE 1 TABLET TWICE DAILY 09/28/16  Yes Amy Cletis [place], MD    Physical Exam: Vitals:   10/26/16 2000 10/26/16 2015 10/26/16 2030 10/26/16 2306  BP: 107/68 121/72 109/70 (!) 100/54  Pulse: 66 67 66 62  Resp: 17 16 16 17   Temp:    97.7 F (36.5 C)  TempSrc:    Oral  SpO2: 97% 97% 97% 95%  Weight:    44.9 kg (98 lb 15.8 oz)  Height:    5\' 5"  (1.651 m)     General:  Appears calm and comfortable and is NAD Eyes:  PERRL, EOMI, normal lids, iris ENT:  grossly normal hearing, lips & tongue, mmm Neck:  no LAD, masses or thyromegaly Cardiovascular:  RRR, murmur present that radiates into L>R carotid, no r/g. No LE edema.  Respiratory:  CTA bilaterally, no w/r/r. Normal respiratory effort. Abdomen:  soft, ntnd, NABS Skin:  no rash or induration seen on limited exam Musculoskeletal:  Left arm pronator drift, arm>leg weakness, good ROM, no bony abnormality Psychiatric:  grossly normal mood and affect, speech fluent and appropriate,  AOx3 Neurologic:  CN 2-12 grossly intact with very subtle left facial droop, moves all extremities in coordinated fashion but does have some left-sided weakness of lower<upper extremity, sensation intact  Labs on Admission: I have personally reviewed following labs and imaging studies  CBC:  Recent Labs Lab 10/26/16 1700  WBC 6.5  NEUTROABS 5.0  HGB 12.1  HCT 36.5  MCV 95.3  PLT 123456   Basic Metabolic Panel:  Recent Labs Lab 10/26/16 1700  NA 143  K 3.5  CL 111  CO2 24  GLUCOSE 96  BUN 13  CREATININE 0.75  CALCIUM 8.4*   GFR: Estimated Creatinine Clearance: 34.5  mL/min (by C-G formula based on SCr of 0.75 mg/dL). Liver Function Tests:  Recent Labs Lab 10/26/16 1700  AST 20  ALT 6*  ALKPHOS 55  BILITOT 0.6  PROT 5.4*  ALBUMIN 2.6*   No results for input(s): LIPASE, AMYLASE in the last 168 hours. No results for input(s): AMMONIA in the last 168 hours. Coagulation Profile: No results for input(s): INR, PROTIME in the last 168 hours. Cardiac Enzymes:  Recent Labs Lab 10/26/16 2314  TROPONINI 0.04*   BNP (last 3 results) No results for input(s): PROBNP in the last 8760 hours. HbA1C: No results for input(s): HGBA1C in the last 72 hours. CBG: No results for input(s): GLUCAP in the last 168 hours. Lipid Profile: No results for input(s): CHOL, HDL, LDLCALC, TRIG, CHOLHDL, LDLDIRECT in the last 72 hours. Thyroid Function Tests: No results for input(s): TSH, T4TOTAL, FREET4, T3FREE, THYROIDAB in the last 72 hours. Anemia Panel: No results for input(s): VITAMINB12, FOLATE, FERRITIN, TIBC, IRON, RETICCTPCT in the last 72 hours. Urine analysis:    Component Value Date/Time   COLORURINE YELLOW 10/26/2016 2210   APPEARANCEUR CLOUDY (A) 10/26/2016 2210   LABSPEC 1.018 10/26/2016 2210   PHURINE 5.0 10/26/2016 2210   GLUCOSEU NEGATIVE 10/26/2016 2210   HGBUR SMALL (A) 10/26/2016 2210   BILIRUBINUR NEGATIVE 10/26/2016 2210   BILIRUBINUR negative 08/10/2015  1046   BILIRUBINUR negative 03/31/2015 0918   KETONESUR 5 (A) 10/26/2016 2210   PROTEINUR NEGATIVE 10/26/2016 2210   UROBILINOGEN 0.2 08/10/2015 1046   NITRITE NEGATIVE 10/26/2016 2210   LEUKOCYTESUR MODERATE (A) 10/26/2016 2210    Creatinine Clearance: Estimated Creatinine Clearance: 34.5 mL/min (by C-G formula based on SCr of 0.75 mg/dL).  Sepsis Labs: @LABRCNTIP (procalcitonin:4,lacticidven:4) )No results found for this or any previous visit (from the past 240 hour(s)).   Radiological Exams on Admission: Dg Chest 2 View  Result Date: 10/26/2016 CLINICAL DATA:  LEFT side weakness, confusion EXAM: CHEST  2 VIEW COMPARISON:  01/15/2016 FINDINGS: Enlargement of cardiac silhouette. Mediastinal contours and pulmonary vascularity normal. Atherosclerotic calcification aorta. Emphysematous and minimal bronchitic changes question COPD. No acute infiltrate, pleural effusion, or pneumothorax. Marked osseous demineralization. IMPRESSION: Enlargement of cardiac silhouette. Question COPD changes without acute infiltrate. Aortic atherosclerosis. Electronically Signed   By: Lavonia Dana M.D.   On: 10/26/2016 17:39   Ct Head Wo Contrast  Result Date: 10/26/2016 CLINICAL DATA:  Acute onset left-sided weakness yesterday. EXAM: CT HEAD WITHOUT CONTRAST TECHNIQUE: Contiguous axial images were obtained from the base of the skull through the vertex without intravenous contrast. COMPARISON:  Brain MRI 05/26/2007 and CT 05/25/2007 FINDINGS: Brain: There is no evidence of acute cortical infarct, intracranial hemorrhage, mass, midline shift, or extra-axial fluid collection. There is a small inferior right parietal MCA territory cortical infarct which is new from the prior studies and chronic in appearance. Chronic lacunar infarcts are present in the basal ganglia bilaterally and have increased in the interim. Periventricular white matter hypodensities have also mildly progressed and are compatible with chronic small  vessel ischemic disease. There is a new small to moderate-sized right cerebellar PICA territory infarct which appears chronic. A new right thalamic lacunar infarcts is also felt to be chronic. Cerebral atrophy has progressed, most notable in the perisylvian regions. A partially empty sella is unchanged. Vascular: Calcified atherosclerosis in the carotid siphons. No hyperdense vessel. Skull: No fracture or focal osseous lesion. Sinuses/Orbits: Paranasal sinuses and mastoid air cells are clear. Prior bilateral cataract extraction. Other: None. IMPRESSION: 1. No evidence of acute intracranial  abnormality. 2. Interval chronic infarcts in the right parietal lobe, right cerebellum, bilateral basal ganglia, and right thalamus. Electronically Signed   By: Logan Bores M.D.   On: 10/26/2016 18:11   Mr Brain Wo Contrast  Result Date: 10/26/2016 CLINICAL DATA:  83 y/o  F; left-sided weakness. EXAM: MRI HEAD WITHOUT CONTRAST TECHNIQUE: Multiplanar, multiecho pulse sequences of the brain and surrounding structures were obtained without intravenous contrast. COMPARISON:  10/26/2016 CT head.  05/26/2007 MRI of the head. FINDINGS: Brain: Small focus of diffusion restriction within the right lateral parietal lobe and additional small focus in the right posterior frontal area subcortical white matter compatible with acute/early subacute ischemia. Focus of encephalomalacia in the right inferior cerebellar hemisphere and right frontal parietal junction compatible chronic infarcts. Bilateral lentiform nuclei small chronic lacunar infarcts. Background of moderate chronic microvascular ischemic changes and parenchymal volume loss of the brain. Focus of susceptibility hypointensity within the right brachium pontis and punctate foci and basal ganglia are stable and compatible with hemosiderin deposition from old microhemorrhage. No new susceptibility hypointensity to indicate intracranial hemorrhage. No hydrocephalus, extra-axial  collection, or focal mass effect. Incidental note of partially empty sella turcica. Vascular: Normal flow voids. Right posterior frontal developmental venous anomaly. Skull and upper cervical spine: Normal marrow signal. Sinuses/Orbits: Mild mucosal thickening of the ethmoid air cells. Otherwise no abnormal signal of paranasal sinuses or mastoid air cells. Bilateral intra-ocular lens replacement. Other: None. IMPRESSION: 1. Small acute/early subacute infarct within the right lateral parietal lobe and additional punctate infarct in the right posterior frontal subcortical white matter. No associated hemorrhage. 2. Chronic infarcts in the right inferior cerebellar hemisphere and right frontal parietal junction as well as small chronic lacunar infarcts in the bilateral basal ganglia. 3. Moderate chronic microvascular ischemic changes and parenchymal volume loss of the brain. These results were called by telephone at the time of interpretation on 10/26/2016 at 10:14 pm to Dr. Blanchie Dessert , who verbally acknowledged these results. Electronically Signed   By: Kristine Garbe M.D.   On: 10/26/2016 22:17    EKG: Independently reviewed.  NSR with rate 66; nonspecific ST changes with no evidence of acute ischemia, NSCSLT  Assessment/Plan Principal Problem:   CVA (cerebral vascular accident) ([place]) Active Problems:   Essential hypertension   Aneurysm of thoracic aorta ([facility])   Elevated troponin   Protein-calorie malnutrition, severe ([facility])   CVA -Subacute onset of confusion, facial weakness, L-sided weakness oncerning for TIA/CVA -Symptoms started the day prior but patient refused to come to the ER -Will admit for CVA/TIA evaluation -Telemetry monitoring -MRI/MRA -Carotid dopplers -Echo -Risk stratification with FLP; will also check TSH  -Has been taking ASA daily so likely needs Plavix, but will defer to Neuro (consult is pending) -PT/OT/ST/Nutrition Consults -SW consult for placement -  although the patient is likely to decline placement in favor of in-home services due to her elderly husband who lives with her  HTN -Allow permissive HTN -Treat BP only if >220/120, and then with goal of 15% reduction -Hold Lopressor and plan to restart in 48-72 hours  HLD -Check FLP -Consider statin therapy if FLP is not at goal  Elevated troponin -Troponin 0.04 -Likely demand ischemia, will trend  Malnutrition -Albumin 2.6 -Nutrition consult, as above  DVT prophylaxis:  Lovenox  Code Status: DNR - confirmed with patient/family Family Communication: Daughters present throughout evaluation Disposition Plan:  To be determined Consults called: Neurology, pending Admission status: Admit - It is my clinical opinion that admission to INPATIENT is  reasonable and necessary because this patient will require at least 2 midnights in the hospital to treat this condition based on the medical complexity of the problems presented.  Given the aforementioned information, the predictability of an adverse outcome is felt to be significant.    Karmen Bongo MD [facility]  If 7PM-7AM, please contact night-coverage [email or web address] Password [email or web address]  10/27/2016, 1:28 AM

## 2016-10-26 NOTE — Telephone Encounter (Signed)
Long conversation with [employer]. Symptoms as below, difficulty speaking, left sided weakness, acute onset yesterday. Needing help to get up out of bed. I have strong concerns for potential significant stroke vs. Other acute major event. Recommended evaluation by EMS to the ER.   At this point patient does not want to do this and HCPOA would like to honor her mother's wishes in this regard.   Unable to come to the office for assessment due to inability to navigate and move mother down steps and loss of L sided function.  She understands this could be terminal.

## 2016-10-27 ENCOUNTER — Inpatient Hospital Stay (HOSPITAL_COMMUNITY): Payer: Medicare HMO

## 2016-10-27 DIAGNOSIS — R778 Other specified abnormalities of plasma proteins: Secondary | ICD-10-CM | POA: Diagnosis present

## 2016-10-27 DIAGNOSIS — I639 Cerebral infarction, unspecified: Secondary | ICD-10-CM

## 2016-10-27 DIAGNOSIS — I63411 Cerebral infarction due to embolism of right middle cerebral artery: Secondary | ICD-10-CM

## 2016-10-27 DIAGNOSIS — E43 Unspecified severe protein-calorie malnutrition: Secondary | ICD-10-CM | POA: Diagnosis present

## 2016-10-27 DIAGNOSIS — G459 Transient cerebral ischemic attack, unspecified: Secondary | ICD-10-CM

## 2016-10-27 DIAGNOSIS — I6300 Cerebral infarction due to thrombosis of unspecified precerebral artery: Secondary | ICD-10-CM

## 2016-10-27 DIAGNOSIS — G9340 Encephalopathy, unspecified: Secondary | ICD-10-CM

## 2016-10-27 DIAGNOSIS — N390 Urinary tract infection, site not specified: Secondary | ICD-10-CM

## 2016-10-27 DIAGNOSIS — R7989 Other specified abnormal findings of blood chemistry: Secondary | ICD-10-CM

## 2016-10-27 LAB — ECHOCARDIOGRAM COMPLETE
HEIGHTINCHES: 65 in
WEIGHTICAEL: 1583.78 [oz_av]

## 2016-10-27 LAB — LIPID PANEL
CHOLESTEROL: 143 mg/dL (ref 0–200)
HDL: 48 mg/dL (ref >40)
LDL Cholesterol: 76 mg/dL (ref 0–99)
TRIGLYCERIDES: 95 mg/dL (ref <150)
Total CHOL/HDL Ratio: 3 RATIO
VLDL: 19 mg/dL (ref 0–40)

## 2016-10-27 LAB — TSH: TSH: 6.805 u[IU]/mL — ABNORMAL HIGH (ref 0.350–4.500)

## 2016-10-27 LAB — TROPONIN I
TROPONIN I: 0.04 ng/mL — AB (ref <0.03)
TROPONIN I: 0.03 ng/mL — AB (ref <0.03)
Troponin I: 0.04 ng/mL (ref <0.03)

## 2016-10-27 MED ORDER — CLOPIDOGREL BISULFATE 75 MG PO TABS
75.0000 mg | ORAL_TABLET | Freq: Every day | ORAL | Status: DC
  Administered 2016-10-27 – 2016-10-28 (×2): 75 mg via ORAL
  Filled 2016-10-27 (×2): qty 1

## 2016-10-27 MED ORDER — BOOST / RESOURCE BREEZE PO LIQD: 1.0000 | Freq: Three times a day (TID) | ORAL | Status: DC

## 2016-10-27 MED ORDER — CEFTRIAXONE SODIUM 1 G IJ SOLR
1.0000 g | INTRAMUSCULAR | Status: DC
  Administered 2016-10-27: 1 g via INTRAVENOUS
  Filled 2016-10-27 (×2): qty 10

## 2016-10-27 MED ORDER — ENOXAPARIN SODIUM 30 MG/0.3ML ~~LOC~~ SOLN
30.0000 mg | SUBCUTANEOUS | Status: DC
  Administered 2016-10-28: 30 mg via SUBCUTANEOUS
  Filled 2016-10-27: qty 0.3

## 2016-10-27 NOTE — Evaluation (Signed)
Clinical/Bedside Swallow Evaluation Patient Details  Name: [name] [name] MRN: [id number] Date of Birth: 17-Jan-1926  Today's Date: 10/27/2016 Time: SLP Start Time (ACUTE ONLY): 1308 SLP Stop Time (ACUTE ONLY): 1335 SLP Time Calculation (min) (ACUTE ONLY): 27 min  Past Medical History:  Past Medical History:  Diagnosis Date  . Aortic aneurysm ([place])   . B12 deficiency anemia    s/p Billroth 2  . Carcinoma, basal cell, skin   . COPD (chronic obstructive pulmonary disease) ([place])   . Diplopia   . Diverticular disease   . Hypertension    Unspecified  . PUD (peptic ulcer disease)   . Renal calculus   . Thoracic aortic aneurysm ([facility])    7 cm?  . TIA (transient ischemic attack) 2010   Past Surgical History:  Past Surgical History:  Procedure Laterality Date  . BACK SURGERY  1986  . BASAL CELL CARCINOMA EXCISION    . Chest CT  10/21/2002   Ascending aneurysm; 5.5 X 5.5  . HERNIA REPAIR    . PARTIAL HYSTERECTOMY     for Fibroids  . STOMACH SURGERY  1968   X2 Billroth II; Vagotomysubtotal gastric/ gastrectomy (1989)  . TUBAL LIGATION     HPI:  83 y.o. female with medical history significant of remote TIA, HTN, COPD, and AAA presenting with possible CVA.  On 10/26/16, she had gradual symptoms and was not her usual self.  Daughter realized that things weren't right.  +confusion, difficulty getting things straight.  She kept asking to go home, "sounded like something coming out of an old person's head", she was already at home.  Increasing confusion.  Left arm and leg weakness that started yesterday.  Slight left facial droop, subtle.  ?slight slurring vs. Weakness.  +dysphagia for up to a week, trouble swallowing "heavier foods."      Assessment / Plan / Recommendation Clinical Impression   Pt without overt s/s of aspiration noted during BSE with consistencies varying from thin via straw/cup-->solids; minimal left decreased labial sensation noted x1 with solids, but with minimal verbal  cues, pt cleared material from left labial seal; pt aware for remainder of meal and cleared left labial seal with modified independence; pt c/o "feeling of fullness" during meals intermittently; daughter stated she had gastric surgery in past and her stomach was 1/3 size of typical stomach which caused her to become full easily, but denied any difficulty with swallowing; passed NSSS initially; recommend continue Regular/thin diet with ST to f/u x 1 for tolerance.   Aspiration Risk  Mild aspiration risk    Diet Recommendation   Regular/thin liquids  Medication Administration: Whole meds with liquid    Other  Recommendations Oral Care Recommendations: Oral care BID   Follow up Recommendations Home health SLP;Other (for cognitive deficits) (prn)      Frequency and Duration min 2x/week  1 week       Prognosis Prognosis for Safe Diet Advancement: Good      Swallow Study   General Date of Onset: 10/26/16 HPI: 83 y.o. female with medical history significant of remote TIA, HTN, COPD, and AAA presenting with possible CVA.  Yesterday, she had gradual symptoms.  Was not her usual self.  Daughter realized that things weren't right.  +confusion, difficulty getting things straight.  She kept asking to go home, "sounded like something coming out of an old person's head", she was already at home.  Increasing confusion.  Left arm and leg weakness that started yesterday.  Slight left  facial droop, subtle.  ?slight slurring vs. Weakness.  +dysphagia for up to a week, trouble swallowing "heavier foods."     Type of Study: Bedside Swallow Evaluation Previous Swallow Assessment: passed NSSS Diet Prior to this Study: Regular;Thin liquids Temperature Spikes Noted: No Respiratory Status: Room air History of Recent Intubation: No Behavior/Cognition: Cooperative;Pleasant mood;Other (Comment) (had ativan earlier; min lethargy noted) Oral Cavity Assessment: Within Functional Limits Oral Care Completed by SLP:  No Oral Cavity - Dentition: Dentures, top;Dentures, bottom Vision: Impaired for self-feeding Self-Feeding Abilities: Able to feed self;Needs assist Patient Positioning: Upright in bed Baseline Vocal Quality: Low vocal intensity Volitional Cough: Strong Volitional Swallow: Able to elicit    Oral/Motor/Sensory Function Overall Oral Motor/Sensory Function: Other (comment) (min sensory left involvement (labial))   Ice Chips Ice chips: Not tested   Thin Liquid Thin Liquid: Within functional limits Presentation: Cup;Straw    Nectar Thick Nectar Thick Liquid: Not tested   Honey Thick Honey Thick Liquid: Not tested   Puree Puree: Within functional limits Presentation: Spoon   Solid      Solid: Impaired Presentation: Spoon Oral Phase Functional Implications: Other (comment) (mild labial decreased sensation left)    Functional Assessment Tool Used: NOMS Functional Limitations: Swallowing Swallow Current Status [id number]): At least 1 percent but less than 20 percent impaired, limited or restricted Swallow Goal Status [phone]): At least 1 percent but less than 20 percent impaired, limited or restricted   Shiron Whetsel,PAT, M.S., CCC-SLP 10/27/2016,3:08 PM

## 2016-10-27 NOTE — Evaluation (Signed)
Occupational Therapy Evaluation Patient Details Name: [name] [name] MRN: [id number] DOB: Mar 23, 1926 Today's Date: 10/27/2016    History of Present Illness 83 y.o.femalewith medical history significant of remote TIA, HTN, COPD, and AAA presenting with possible CVA. MRI on 1/10 + for small acute/early subacute infarct within the R lateral parietal lobe and additional punctate infarct in the R posterior frontal subcortical white matter.   Clinical Impression   Family reports pt was independent with BADL PTA. Currently pt requires mod assist +2 for functional mobility and min-mod assist overall for ADL. Pt presenting with bil UE weakness (L>R), poor sitting/standing balance, requires cues for sequencing/safety impacting her independence and safety with ADL and functional mobility. Pt planning to d/c home with 24/7 supervision from family. Recommending HHOT and 24/7 assist for follow up to maximize independence and safety with ADL and functional mobility upon return home. Pt would benefit from continued skilled OT to address established goals.    Follow Up Recommendations  Home health OT;Supervision/Assistance - 24 hour    Equipment Recommendations  None recommended by OT    Recommendations for Other Services       Precautions / Restrictions Precautions Precautions: Fall Restrictions Weight Bearing Restrictions: No      Mobility Bed Mobility Overal bed mobility: Needs Assistance Bed Mobility: Supine to Sit     Supine to sit: Min guard;HOB elevated     General bed mobility comments: Increased time required. HOB elevated with use of bed rail.  Transfers Overall transfer level: Needs assistance Equipment used: 2 person hand held assist Transfers: Sit to/from [facility] Sit to Stand: Min assist Stand pivot transfers: Min assist       General transfer comment: Min assist for balance.     Balance Overall balance assessment: Needs  assistance Sitting-balance support: Feet supported;No upper extremity supported Sitting balance-Leahy Scale: Fair   Postural control: Left lateral lean Standing balance support: Bilateral upper extremity supported Standing balance-Leahy Scale: Poor                              ADL Overall ADL's : Needs assistance/impaired     Grooming: Min guard;Sitting;Wash/dry hands   Upper Body Bathing: Minimal assistance;Sitting   Lower Body Bathing: Moderate assistance;Sit to/from stand   Upper Body Dressing : Minimal assistance;Sitting   Lower Body Dressing: Moderate assistance;Sit to/from stand Lower Body Dressing Details (indicate cue type and reason): Assist to start sock over foot and to pull up underwear in standing. Toilet Transfer: Minimal assistance;Stand-pivot;BSC   Toileting- Clothing Manipulation and Hygiene: Sit to/from stand;Minimal assistance Toileting - Clothing Manipulation Details (indicate cue type and reason): Pt able to complete peri care with set up in sitting. Min assist to pull up underwear in standing.     Functional mobility during ADLs: Moderate assistance;+2 for physical assistance;Cueing for sequencing (2 person hand held assist)       Vision Additional Comments: Needs further assessment   Perception     Praxis      Pertinent Vitals/Pain Pain Assessment: No/denies pain     Hand Dominance Right   Extremity/Trunk Assessment Upper Extremity Assessment Upper Extremity Assessment: RUE deficits/detail;LUE deficits/detail RUE Deficits / Details: Grossly 3+/5.  RUE Coordination: decreased fine motor;decreased gross motor LUE Deficits / Details: Grossly 3-/5. Decreased grip strength compared to R side.  LUE Coordination: decreased fine motor;decreased gross motor   Lower Extremity Assessment Lower Extremity Assessment: Defer to PT evaluation  Cervical / Trunk Assessment Cervical / Trunk Assessment: Kyphotic   Communication  Communication Communication: HOH   Cognition Arousal/Alertness: Awake/alert Behavior During Therapy: WFL for tasks assessed/performed Overall Cognitive Status: Within Functional Limits for tasks assessed                     General Comments       Exercises       Shoulder Instructions      Home Living Family/patient expects to be discharged to:: Private residence Living Arrangements: Spouse/significant other Available Help at Discharge: Family;Available 24 hours/day Type of Home: House Home Access: Stairs to enter [employer] of Steps: 4 Entrance Stairs-Rails: Left Home Layout: One level;Laundry or work area in basement     [employer] Shower/Tub: Teacher, early years/pre: Standard (BSC over toilet and next to bed) Bathroom Accessibility: Yes   Home Equipment: Environmental consultant - 4 wheels;Cane - single point;Bedside commode;Shower seat          Prior Functioning/Environment Level of Independence: Independent                 OT Problem List: Decreased strength;Decreased range of motion;Decreased activity tolerance;Impaired balance (sitting and/or standing);Decreased coordination;Decreased cognition;Decreased safety awareness;Decreased knowledge of use of DME or AE;Impaired UE functional use   OT Treatment/Interventions: Self-care/ADL training;Neuromuscular education;Energy conservation;DME and/or AE instruction;Therapeutic activities;Patient/family education;Balance training    OT Goals(Current goals can be found in the care plan section) Acute Rehab OT Goals Patient Stated Goal: none stated OT Goal Formulation: With patient/family Time For Goal Achievement: 11/10/16 Potential to Achieve Goals: Good ADL Goals Pt Will Perform Grooming: with set-up;sitting Pt Will Perform Upper Body Bathing: with set-up;sitting Pt Will Perform Lower Body Bathing: with supervision;sit to/from stand Pt Will Transfer to Toilet: with supervision;ambulating;bedside  commode (over toilet) Pt Will Perform Toileting - Clothing Manipulation and hygiene: with supervision;sit to/from stand  OT Frequency: Min 2X/week   Barriers to D/C:            Co-evaluation PT/OT/SLP Co-Evaluation/Treatment: Yes Reason for Co-Treatment: For patient/therapist safety;To address functional/ADL transfers   OT goals addressed during session: ADL's and self-care      End of Session Equipment Utilized During Treatment: Gait belt Nurse Communication: Mobility status  Activity Tolerance: Patient tolerated treatment well Patient left: in chair;with call bell/phone within reach;with family/visitor present;with nursing/sitter in room   Time: [id number] OT Time Calculation (min): 26 min Charges:  OT General Charges $OT Visit: 1 Procedure OT Evaluation $OT Eval Moderate Complexity: 1 Procedure G-Codes:     Binnie Kand M.S., OTR/L Pager: [phone]  10/27/2016, 11:06 AM

## 2016-10-27 NOTE — Progress Notes (Signed)
MRA head images reviewed. No focal occlusion or significant stenosis seen. The right parietal para-sulcal abnormality described in the Radiologist's report appears most consistent with chronic cortical laminar necrosis at the site of a prior ischemic stroke.  Electronically signed: Dr. Kerney Elbe

## 2016-10-27 NOTE — Progress Notes (Signed)
Initial Nutrition Assessment  DOCUMENTATION CODES:   Underweight  INTERVENTION:   -Boost Breeze po TID, each supplement provides 250 kcal and 9 grams of protein -Snacks TID -Downgrade diet to dysphagia 2 for ease of intake  NUTRITION DIAGNOSIS:   Inadequate oral intake related to poor appetite as evidenced by meal completion < 25%.  GOAL:   Patient will meet greater than or equal to 90% of their needs  MONITOR:   PO intake, Supplement acceptance, Labs, Weight trends, Skin, I & O's  REASON FOR ASSESSMENT:   Consult  (CVA)  ASSESSMENT:   [name] [name] is a 83 y.o. female with medical history significant of remote TIA, HTN, COPD, and AAA presenting with possible CVA.  Yesterday, she had gradual symptoms.  Was not her usual self.  Daughter realized that things weren't right.  +confusion, difficulty getting things straight.  She kept asking to go home, "sounded like something coming out of an old person's head", she was already at home.  Increasing confusion.  Left arm and leg weakness that started yesterday.  Slight left facial droop, subtle.  ?slight slurring vs. Weakness.  +dysphagia for up to a week, trouble swallowing "heavier foods."    Has chronic headaches.    Pt admitted with CVA.   Hx obtained from pt family at bedside. They report pt has a fair appetite at baseline and typically consumes small, frequent meals daily. They share that pt had part of her stomach removed in the 1960's as a result of recurrent ulcers. Pt typically consumes softer texture type foods, however, has difficulty consuming bread over the past few days, which she enjoys eating. Additionally, pt daughter reports she has not been eating her snacks (peanut butter crackers and chips) as usual. Pt consumed less than 50% of breakfast tray at bedside.   Per pt daughter, pt has always been small framed and typical weight is between 100-102#, which is consistent with wt hx. Pt daughter and grandson at bedside  are also small-framed, so suspect this is pt's usual body type.   Nutrition-Focused physical exam completed. Findings are mild to moderate fat depletion, mild to moderate muscle depletion, and no edema. Difficult to determine if depletions are related to advanced age, limited mobility, or compromised nutritional status.   Discussed importance of good meal and supplement intake to promote healing. Per pt daughter, pt does not tolerate Ensure or other milky-type supplements or foods well. Amenable to Boost Breeze and between meals snacks, which RD will order.   Labs reviewed.   Diet Order:  Diet Heart Room service appropriate? Yes; Fluid consistency: Thin  Skin:  Reviewed, no issues  Last BM:  10/26/16  Height:   Ht Readings from Last 1 Encounters:  10/26/16 5\' 5"  (1.651 m)    Weight:   Wt Readings from Last 1 Encounters:  10/26/16 98 lb 15.8 oz (44.9 kg)    Ideal Body Weight:  56.8 kg  BMI:  Body mass index is 16.47 kg/m.  Estimated Nutritional Needs:   Kcal:  1100-1300  Protein:  45-60 grams  Fluid:  > 1.1 L  EDUCATION NEEDS:   Education needs addressed  [name] [name], RD, LDN, CDE Pager: [phone] After hours Pager: [phone]

## 2016-10-27 NOTE — Progress Notes (Signed)
STROKE TEAM PROGRESS NOTE   HISTORY OF PRESENT ILLNESS (per record) [name] [name] is an 83 y.o. female who presented with new CVA. Her symptoms began on Tuesday around lunchtime, initially noted by family as "not her usual self". She had confusion and "difficulty getting things straight". Kept asking to go home, while already at home. Confusion increased and then left arm and leg weakness as well as a slight left facial droop and slurred speech were noted. She has had no recent falls, infectious symptoms or medication changes. She was out of the tPA window on arrival to the ED.   EKG showed no atrial fibrillation. CT head showed no evidence of acute intracranial abnormality, but several chronic infarcts were noted, which were new relative to the prior CT from 2008. The infarcts are seen in the right parietal lobe, right cerebellum, bilateral basal ganglia, and right thalamus.  Her PMHx includes remote TIA, HTN, COPD, and AAA. She takes ASA at home and is not on anticoagulation.   Patient was not administered IV t-PA secondary to Delay in arrival. She was admitted for further evaluation and treatment.   SUBJECTIVE (INTERVAL HISTORY) Her family is at the bedside.  Overall she feels her condition is stable. Family was very protective of her. They were consistent that her IV be allowed to continue to beep/stop working in order to allow her eat and they understood the risk that she may need to be restuck for a replacement IV after eating. This was discussed with her nurse Claiborne Billings.    OBJECTIVE Temp:  [97.3 F (36.3 C)-98.3 F (36.8 C)] 97.6 F (36.4 C) (01/11 1321) Pulse Rate:  [62-76] 75 (01/11 1321) Cardiac Rhythm: Normal sinus rhythm (01/11 0700) Resp:  [12-20] 17 (01/11 1321) BP: (99-122)/(54-78) 118/63 (01/11 1321) SpO2:  [95 %-100 %] 97 % (01/11 1321) Weight:  [44.9 kg (98 lb 15.8 oz)-45.4 kg (100 lb)] 44.9 kg (98 lb 15.8 oz) (01/10 2306)  CBC:  Recent Labs Lab 10/26/16 1700  WBC  6.5  NEUTROABS 5.0  HGB 12.1  HCT 36.5  MCV 95.3  PLT [id number]    Basic Metabolic Panel:  Recent Labs Lab 10/26/16 1700  NA 143  K 3.5  CL 111  CO2 24  GLUCOSE 96  BUN 13  CREATININE 0.75  CALCIUM 8.4*    Lipid Panel:    Component Value Date/Time   CHOL 143 10/27/2016 0610   TRIG 95 10/27/2016 0610   HDL 48 10/27/2016 0610   CHOLHDL 3.0 10/27/2016 0610   VLDL 19 10/27/2016 0610   LDLCALC 76 10/27/2016 0610   HgbA1c:  Lab Results  Component Value Date   HGBA1C  05/27/2007    5.3 (NOTE)   The ADA recommends the following therapeutic goals for glycemic   control related to Hgb A1C measurement:   Goal of Therapy:   < 7.0% Hgb A1C   Action Suggested:  > 8.0% Hgb A1C   Ref:  Diabetes Care, 22, Suppl. 1, 1999   Urine Drug Screen: No results found for: LABOPIA, COCAINSCRNUR, LABBENZ, AMPHETMU, THCU, LABBARB    IMAGING  Dg Chest 2 View  Result Date: 10/26/2016 CLINICAL DATA:  LEFT side weakness, confusion EXAM: CHEST  2 VIEW COMPARISON:  01/15/2016 FINDINGS: Enlargement of cardiac silhouette. Mediastinal contours and pulmonary vascularity normal. Atherosclerotic calcification aorta. Emphysematous and minimal bronchitic changes question COPD. No acute infiltrate, pleural effusion, or pneumothorax. Marked osseous demineralization. IMPRESSION: Enlargement of cardiac silhouette. Question COPD changes without acute infiltrate. Aortic atherosclerosis.  Electronically Signed   By: Lavonia Dana M.D.   On: 10/26/2016 17:39   Ct Head Wo Contrast  Result Date: 10/27/2016 CLINICAL DATA:  Lethargy. Pt unable to provide hx. EXAM: CT HEAD WITHOUT CONTRAST TECHNIQUE: Contiguous axial images were obtained from the base of the skull through the vertex without intravenous contrast. COMPARISON:  Head CT 10/26/2016 FINDINGS: Brain: No acute intracranial hemorrhage. No focal mass lesion. No CT evidence of acute infarction. No midline shift or mass effect. No hydrocephalus. Basilar cisterns are patent.  Subcortical infarction in the high RIGHT frontal lobe compares to recent MRI findings. Remote infarction in the RIGHT caudate nucleus/internal capsule. RIGHT cerebellar infarction also noted. There are periventricular and subcortical white matter hypodensities. Generalized cortical atrophy. Vascular: No hyperdense vessel or unexpected calcification. Skull: Normal. Negative for fracture or focal lesion. Sinuses/Orbits: Paranasal sinuses and mastoid air cells are clear. Orbits are clear. Other: None. IMPRESSION: 1. No acute intracranial findings. 2. RIGHT sided infarctions unchanged from comparison MRI. Electronically Signed   By: Suzy Bouchard M.D.   On: 10/27/2016 12:07   Ct Head Wo Contrast  Result Date: 10/26/2016 CLINICAL DATA:  Acute onset left-sided weakness yesterday. EXAM: CT HEAD WITHOUT CONTRAST TECHNIQUE: Contiguous axial images were obtained from the base of the skull through the vertex without intravenous contrast. COMPARISON:  Brain MRI 05/26/2007 and CT 05/25/2007 FINDINGS: Brain: There is no evidence of acute cortical infarct, intracranial hemorrhage, mass, midline shift, or extra-axial fluid collection. There is a small inferior right parietal MCA territory cortical infarct which is new from the prior studies and chronic in appearance. Chronic lacunar infarcts are present in the basal ganglia bilaterally and have increased in the interim. Periventricular white matter hypodensities have also mildly progressed and are compatible with chronic small vessel ischemic disease. There is a new small to moderate-sized right cerebellar PICA territory infarct which appears chronic. A new right thalamic lacunar infarcts is also felt to be chronic. Cerebral atrophy has progressed, most notable in the perisylvian regions. A partially empty sella is unchanged. Vascular: Calcified atherosclerosis in the carotid siphons. No hyperdense vessel. Skull: No fracture or focal osseous lesion. Sinuses/Orbits: Paranasal  sinuses and mastoid air cells are clear. Prior bilateral cataract extraction. Other: None. IMPRESSION: 1. No evidence of acute intracranial abnormality. 2. Interval chronic infarcts in the right parietal lobe, right cerebellum, bilateral basal ganglia, and right thalamus. Electronically Signed   By: Logan Bores M.D.   On: 10/26/2016 18:11   Mr Brain Wo Contrast  Result Date: 10/26/2016 CLINICAL DATA:  83 y/o  F; left-sided weakness. EXAM: MRI HEAD WITHOUT CONTRAST TECHNIQUE: Multiplanar, multiecho pulse sequences of the brain and surrounding structures were obtained without intravenous contrast. COMPARISON:  10/26/2016 CT head.  05/26/2007 MRI of the head. FINDINGS: Brain: Small focus of diffusion restriction within the right lateral parietal lobe and additional small focus in the right posterior frontal area subcortical white matter compatible with acute/early subacute ischemia. Focus of encephalomalacia in the right inferior cerebellar hemisphere and right frontal parietal junction compatible chronic infarcts. Bilateral lentiform nuclei small chronic lacunar infarcts. Background of moderate chronic microvascular ischemic changes and parenchymal volume loss of the brain. Focus of susceptibility hypointensity within the right brachium pontis and punctate foci and basal ganglia are stable and compatible with hemosiderin deposition from old microhemorrhage. No new susceptibility hypointensity to indicate intracranial hemorrhage. No hydrocephalus, extra-axial collection, or focal mass effect. Incidental note of partially empty sella turcica. Vascular: Normal flow voids. Right posterior frontal developmental venous anomaly.  Skull and upper cervical spine: Normal marrow signal. Sinuses/Orbits: Mild mucosal thickening of the ethmoid air cells. Otherwise no abnormal signal of paranasal sinuses or mastoid air cells. Bilateral intra-ocular lens replacement. Other: None. IMPRESSION: 1. Small acute/early subacute infarct  within the right lateral parietal lobe and additional punctate infarct in the right posterior frontal subcortical white matter. No associated hemorrhage. 2. Chronic infarcts in the right inferior cerebellar hemisphere and right frontal parietal junction as well as small chronic lacunar infarcts in the bilateral basal ganglia. 3. Moderate chronic microvascular ischemic changes and parenchymal volume loss of the brain. These results were called by telephone at the time of interpretation on 10/26/2016 at 10:14 pm to Dr. Blanchie Dessert , who verbally acknowledged these results. Electronically Signed   By: Kristine Garbe M.D.   On: 10/26/2016 22:17   Mr Jodene Nam Head/brain [id number] Cm  Result Date: 10/27/2016 CLINICAL DATA:  Confusion.  Left arm and leg weakness. EXAM: MRA HEAD WITHOUT CONTRAST TECHNIQUE: Angiographic images of the Circle of Willis were obtained using MRA technique without intravenous contrast. COMPARISON:  Brain MRI 10/26/2016 Head CT 10/26/2016 FINDINGS: Focal signal abnormality within the subarachnoid space overlying the right parietal lobe. Intracranial internal carotid arteries: Normal. Anterior cerebral arteries: Normal. Middle cerebral arteries: Normal. Posterior communicating arteries: Present bilaterally. Posterior cerebral arteries: Bilateral fetal origins. Basilar artery: Diminutive basilar artery is likely a congenital variant given the presence of bilateral fetal origins of the posterior cerebral arteries. Vertebral arteries: Left dominant. Normal. Superior cerebellar arteries: Normal. Anterior inferior cerebellar arteries: Normal. Posterior inferior cerebellar arteries: Normal. IMPRESSION: 1. No occlusion or high-grade stenosis of the intracranial arteries. 2. Focal signal abnormality within one of the right parietal sulci. This is indeterminate on this time-of-flight examination, but CT is recommended to exclude the possibility of subarachnoid hemorrhage at this location. These results  will be called to the ordering clinician or representative by the Radiologist Assistant, and communication documented in the PACS or zVision Dashboard. Electronically Signed   By: Ulyses Jarred M.D.   On: 10/27/2016 05:07    PHYSICAL EXAM Pleasant  elderly lady not in distress. . Afebrile. Head is nontraumatic. Neck is supple without bruit.    Cardiac exam no murmur or gallop. Lungs are clear to auscultation. Distal pulses are well felt. Neurological Exam :  Awake alert oriented x 3 normal speech and language. Mild left lower face asymmetry. Tongue midline. No drift. Mild diminished fine finger movements on left. Orbits right over left upper extremity. Mild left grip weak.. Normal sensation . Normal coordination.  ASSESSMENT/PLAN Ms. NAHLA BARGMAN is a 83 y.o. female with history of TIA, HTN, COPD, and AAA presenting with L sided facial droop and L arm weakness. She did not receive IV t-PA due to delay in arrival.   Stroke:  Right lateral parietal lobe and right posterior frontal lobe subcortical white matter small infarcts  secondary to small vessel disease    MRI  right lateral parietal lobe, punctate right posterior frontal subcortical white matter infarct. Old infarct right inferior cerebellar hemisphere and right frontal parietal junction, bilateral basal ganglia lacunae  MRA  no significant high-grade stenosis or occlusion  Carotid Doppler  pending  2D Echo  pending  LDL 76  HgbA1c pending  Lovenox 30 mg sq daily for VTE prophylaxis  Diet Heart Room service appropriate? Yes; Fluid consistency: Thin  aspirin 81 mg daily prior to admission, now on clopidogrel 75 mg daily  N that breast cancer MRI. Ordering order mildly? I  hadPatient counseled to be compliant with her antithrombotic medications  Ongoing aggressive stroke risk factor management  Therapy recommendations:  HH PT, HH OT  Disposition:  Plan return home (independent furniture walker at home)     Hypertension  Stable  Permissive hypertension (OK if < 220/120) but gradually normalize in 5-7 days  Long-term BP goal normotensive  Hyperlipidemia  Home meds:  No statin  LDL 76, goal < 70  Given family propensity to work conservative treatment, recommend discussion with them prior to starting statin given borderline levels. Document reason for non treatment if statin not used.   Other Stroke Risk Factors  Advanced age  Family hx stroke (fahter)  Thoracic aortic aneurysm 28mm  Other Active Problems  Elevated troponin  Malnutrition   Hospital day # 1  BIBY,[name]  [place] for Pager information 10/27/2016 5:49 PM  I have personally examined this patient, reviewed notes, independently viewed imaging studies, participated in medical decision making and plan of care.ROS completed by me personally and pertinent positives fully documented  I have made any additions or clarifications directly to the above note. Agree with note above.  She presented with mild left sided weakness from a right brain subcortical lacunar infarct. Recommend change aspirin to Plavix and aggressive risk factor modification. Greater than 50% time during this 35 minute visit was spent on counseling and coordination of care about stroke risk, evaluation, prevention and treatment.    Antony Contras, MD Medical Director [facility] Pager: [phone] 10/27/2016 7:45 PM  To contact Stroke Continuity provider, please refer to [email or web address] After hours, contact General Neurology

## 2016-10-27 NOTE — Progress Notes (Signed)
  Echocardiogram 2D Echocardiogram has been performed.  Donata Clay 10/27/2016, 4:04 PM

## 2016-10-27 NOTE — Evaluation (Signed)
Speech Language Pathology Evaluation Patient Details Name: [name] [name] MRN: [id number] DOB: 1926-02-11 Today's Date: 10/27/2016 Time: [id number] SLP Time Calculation (min) (ACUTE ONLY): 27 min  Problem List:  Patient Active Problem List   Diagnosis Date Noted  . Elevated troponin 10/27/2016  . Protein-calorie malnutrition, severe ([place]) 10/27/2016  . CVA (cerebral vascular accident) ([place]) 10/26/2016  . Osteoarthritis of lumbar spine 10/07/2011  . Essential hypertension 01/12/2009  . Aneurysm of thoracic aorta ([place]) 01/12/2009  . CARCINOMA, BASAL CELL, HX OF 11/07/2008  . RENAL CALCULUS, HX OF 11/07/2008  . ANEMIA, B12 DEFICIENCY 11/04/2008  . Iron deficiency anemia 11/04/2008  . TIA 06/01/2007  . CAROTID BRUIT, RIGHT 05/25/2007  . COPD 10/17/2002  . DIVERTICULAR DISEASE 10/17/1986   Past Medical History:  Past Medical History:  Diagnosis Date  . Aortic aneurysm ([place])   . B12 deficiency anemia    s/p Billroth 2  . Carcinoma, basal cell, skin   . COPD (chronic obstructive pulmonary disease) ([place])   . Diplopia   . Diverticular disease   . Hypertension    Unspecified  . PUD (peptic ulcer disease)   . Renal calculus   . Thoracic aortic aneurysm ([facility])    7 cm?  . TIA (transient ischemic attack) 2010   Past Surgical History:  Past Surgical History:  Procedure Laterality Date  . BACK SURGERY  1986  . BASAL CELL CARCINOMA EXCISION    . Chest CT  10/21/2002   Ascending aneurysm; 5.5 X 5.5  . HERNIA REPAIR    . PARTIAL HYSTERECTOMY     for Fibroids  . STOMACH SURGERY  1968   X2 Billroth II; Vagotomysubtotal gastric/ gastrectomy (1989)  . TUBAL LIGATION     HPI:  83 y.o. female with medical history significant of remote TIA, HTN, COPD, and AAA presenting with possible CVA.  Yesterday, she had gradual symptoms.  Was not her usual self.  Daughter realized that things weren't right.  +confusion, difficulty getting things straight.  She kept asking to go home, "sounded like  something coming out of an old person's head", she was already at home.  Increasing confusion.  Left arm and leg weakness that started yesterday.  Slight left facial droop, subtle.  ?slight slurring vs. Weakness.  +dysphagia for up to a week, trouble swallowing "heavier foods."      Assessment / Plan / Recommendation Clinical Impression   Pt with left inattention during SLE noted minimally during meal intake; pt was instructed to attend to left side during po intake with minimal verbal cueing and required occasional reminders for self-feeding when attending to left; appears safety is [facility] as family has no concerns and pt able to state deficits/risks when asked during SLE; vision impacts overall reading d/t macular degeneration at baseline, but left inattention is new per daughter.  Left decreased sensation on left noted minimally during BSE as well with po intake of solids; verbal expression and auditory comprehension WFL; pt intelligible at conversation level with 90-100% accuracy; recommend ST to f/u while in house prn and [facility] SLP f/u when D/C home.    SLP Assessment  Patient needs continued Speech Language Pathology Services    Follow Up Recommendations  Home health SLP;Other (prn)    Frequency and Duration min 2x/week  1 week      SLP Evaluation Cognition  Overall Cognitive Status: Impaired/Different from baseline Arousal/Alertness: Awake/alert Orientation Level: Oriented X4 Attention: Sustained Sustained Attention: Impaired Sustained Attention Impairment: Verbal basic;Functional basic Memory: Appears intact Awareness:  Impaired Awareness Impairment: Anticipatory impairment Problem Solving: Appears intact Safety/Judgment: Appears intact       Comprehension  Auditory Comprehension Overall Auditory Comprehension: Appears within functional limits for tasks assessed Yes/No Questions: Within Functional Limits Commands: Within Functional Limits Conversation: Complex Visual  Recognition/Discrimination Discrimination: Not tested Reading Comprehension Reading Status: Not tested    Expression Expression Primary Mode of Expression: Verbal Verbal Expression Overall Verbal Expression: Appears within functional limits for tasks assessed Initiation: No impairment Level of Generative/Spontaneous Verbalization: Conversation Repetition: No impairment Naming: No impairment Pragmatics: No impairment Non-Verbal Means of Communication: Not applicable Written Expression Dominant Hand: Right Written Expression: Not tested   Oral / Motor  Oral Motor/Sensory Function Overall Oral Motor/Sensory Function: Other (comment) Motor Speech Overall Motor Speech: Appears within functional limits for tasks assessed Respiration: Within functional limits Phonation: Low vocal intensity Resonance: Within functional limits Articulation: Within functional limitis Intelligibility: Intelligible Motor Planning: Witnin functional limits Motor Speech Errors: Not applicable             Functional Assessment Tool Used: NOMS Functional Limitations: Attention Swallow Current Status [id number]): At least 20 percent but less than 40 percent impaired, limited or restricted Swallow Goal Status [phone]): At least 1 percent but less than 20 percent impaired, limited or restricted         Ludivina Guymon,PAT, M.S., CCC-SLP 10/27/2016, 3:20 PM

## 2016-10-27 NOTE — Progress Notes (Signed)
  Echocardiogram 2D Echocardiogram has been performed.  [name] [name] 10/27/2016, 2:02 PM

## 2016-10-27 NOTE — Progress Notes (Signed)
VASCULAR LAB PRELIMINARY  PRELIMINARY  PRELIMINARY  PRELIMINARY  Carotid duplex completed.    Preliminary report:  Bilateral:  1-39% ICA stenosis.  Vertebral artery flow is antegrade.  Technically difficult due to tortuosity   Jerrick Farve, RVS 10/27/2016, 5:48 PM

## 2016-10-27 NOTE — Evaluation (Addendum)
Physical Therapy Evaluation Patient Details Name: [name] [name] MRN: [id number] DOB: [birth year] Today's Date: 10/27/2016   History of Present Illness  83 y.o.femalewith medical history significant of remote TIA, HTN, COPD, and AAA presenting with possible CVA. MRI on 1/10 + for small acute/early subacute infarct within the R lateral parietal lobe and additional punctate infarct in the R posterior frontal subcortical white matter.  Clinical Impression  Pt admitted with above diagnosis. Pt currently with functional limitations due to the deficits listed below (see PT Problem List). Pt will benefit from skilled PT to increase their independence and safety with mobility to allow discharge to the venue listed below.  Family's plan is to take pt home and state they can provide 24 hour A. Recommend HHPT as well.  If they are unable to provide 24 hour A, then recommend SNF.  Currently pt needs +1 for transfers and +2 for gait.     Follow Up Recommendations Home health PT;Supervision/Assistance - 24 hour    Equipment Recommendations  None recommended by PT    Recommendations for Other Services       Precautions / Restrictions Precautions Precautions: Fall Restrictions Weight Bearing Restrictions: No      Mobility  Bed Mobility Overal bed mobility: Needs Assistance Bed Mobility: Supine to Sit     Supine to sit: Min guard;HOB elevated     General bed mobility comments: Increased time required. HOB elevated with use of bed rail.  Transfers Overall transfer level: Needs assistance Equipment used: 2 person hand held assist Transfers: Sit to/from [facility] Sit to Stand: Min assist Stand pivot transfers: Min assist       General transfer comment: Min assist for balance.   Ambulation/Gait Ambulation/Gait assistance: Mod assist;+2 physical assistance Ambulation Distance (Feet): 12 Feet Assistive device: 2 person hand held assist Gait Pattern/deviations: Trunk  flexed;Ataxic;Decreased step length - right;Decreased step length - left;Shuffle Gait velocity: decreased   General Gait Details: Pt with extremely flexed/kyphotic/scoliotic posture with HHA of 2 and ataxia noted  Stairs            Wheelchair Mobility    Modified Rankin (Stroke Patients Only) Modified Rankin (Stroke Patients Only) Pre-Morbid Rankin Score: Moderate disability Modified Rankin: Moderately severe disability     Balance Overall balance assessment: Needs assistance Sitting-balance support: Feet supported;No upper extremity supported Sitting balance-Leahy Scale: Fair   Postural control: Left lateral lean Standing balance support: Bilateral upper extremity supported Standing balance-Leahy Scale: Poor Standing balance comment: requires B UE support at all times                             Pertinent Vitals/Pain Pain Assessment: No/denies pain    Home Living Family/patient expects to be discharged to:: Private residence Living Arrangements: Spouse/significant other (children live next door) Available Help at Discharge: Family;Available 24 hours/day Type of Home: House Home Access: Stairs to enter Entrance Stairs-Rails: Left Entrance Stairs-Number of Steps: 4 Home Layout: One level;Laundry or work area in [place]: Environmental consultant - 4 wheels;Cane - single point;Bedside commode;Shower seat      Prior Function Level of Independence: Independent               Hand Dominance   Dominant Hand: Right    Extremity/Trunk Assessment   Upper Extremity Assessment Upper Extremity Assessment: Defer to OT evaluation RUE Deficits / Details: Grossly 3+/5.  RUE Coordination: decreased fine motor;decreased gross motor LUE Deficits / Details:  Grossly 3-/5. Decreased grip strength compared to R side.  LUE Coordination: decreased fine motor;decreased gross motor    Lower Extremity Assessment Lower Extremity Assessment: RLE deficits/detail;LLE  deficits/detail RLE Deficits / Details: Hip flex 3+/5 knee ext 4+/5 LLE Deficits / Details: Hip flex 3/5, knee ext 4-/5 LLE Coordination: decreased gross motor    Cervical / Trunk Assessment Cervical / Trunk Assessment: Kyphotic  Communication   Communication: HOH  Cognition Arousal/Alertness: Awake/alert Behavior During Therapy: WFL for tasks assessed/performed Overall Cognitive Status: Within Functional Limits for tasks assessed                      General Comments      Exercises     Assessment/Plan    PT Assessment Patient needs continued PT services  PT Problem List Decreased strength;Decreased balance;Decreased mobility;Decreased coordination;Decreased knowledge of use of DME          PT Treatment Interventions DME instruction;Gait training;Functional mobility training;Stair training;Therapeutic activities;Therapeutic exercise;Patient/family education;Balance training;Neuromuscular re-education    PT Goals (Current goals can be found in the Care Plan section)  Acute Rehab PT Goals Patient Stated Goal: improve mobility PT Goal Formulation: With family Time For Goal Achievement: 11/03/16 Potential to Achieve Goals: Good    Frequency Min 4X/week   Barriers to discharge        Co-evaluation PT/OT/SLP Co-Evaluation/Treatment: Yes Reason for Co-Treatment: For patient/therapist safety PT goals addressed during session: Mobility/safety with mobility;Balance OT goals addressed during session: ADL's and self-care       End of Session Equipment Utilized During Treatment: Gait belt Activity Tolerance: Patient tolerated treatment well;Patient limited by fatigue Patient left: in chair;with call bell/phone within reach;with family/visitor present;Other (comment) (family aware if they leave to have staff place alarm) Nurse Communication: Mobility status         Time: [id number] PT Time Calculation (min) (ACUTE ONLY): 26 min   Charges:   PT  Evaluation $PT Eval Moderate Complexity: 1 Procedure     PT G Codes:        Megin Consalvo LUBECK 10/27/2016, 11:33 AM

## 2016-10-27 NOTE — Progress Notes (Signed)
MRA Results called to Dr. Cheral Marker, no CT to be ordered at this time.

## 2016-10-27 NOTE — Consult Note (Signed)
NEURO HOSPITALIST CONSULT NOTE   Requestig physician: Dr. Lorin Mercy  Reason for Consult: Left sided facial droop with left arm weakness  History obtained from:  Family and Chart    HPI:                                                                                                                                          [name] [name] is an 83 y.o. female who presented with new CVA. Her symptoms began on Tuesday around lunchtime, initially noted by family as "not her usual self". She had confusion and "difficulty getting things straight". Kept asking to go home, while already at home. Confusion increased and then left arm and leg weakness as well as a slight left facial droop and slurred speech were noted. She has had no recent falls, infectious symptoms or medication changes. She was out of the tPA window on arrival to the ED.   EKG showed no atrial fibrillation. CT head showed no evidence of acute intracranial abnormality, but several chronic infarcts were noted, which were new relative to the prior CT from 2008. The infarcts are seen in the right parietal lobe, right cerebellum, bilateral basal ganglia, and right thalamus.  Her PMHx includes remote TIA, HTN, COPD, and AAA. She takes ASA at home and is not on anticoagulation.   Past Medical History:  Diagnosis Date  . Aortic aneurysm ([place])   . B12 deficiency anemia    s/p Billroth 2  . Carcinoma, basal cell, skin   . COPD (chronic obstructive pulmonary disease) ([place])   . Diplopia   . Diverticular disease   . Hypertension    Unspecified  . PUD (peptic ulcer disease)   . Renal calculus   . Thoracic aortic aneurysm ([facility])    7 cm?  . TIA (transient ischemic attack) 2010    Past Surgical History:  Procedure Laterality Date  . BACK SURGERY  1986  . BASAL CELL CARCINOMA EXCISION    . Chest CT  10/21/2002   Ascending aneurysm; 5.5 X 5.5  . HERNIA REPAIR    . PARTIAL HYSTERECTOMY     for Fibroids  . STOMACH  SURGERY  1968   X2 Billroth II; Vagotomysubtotal gastric/ gastrectomy (1989)  . TUBAL LIGATION      Family History  Problem Relation Age of Onset  . Cholecystitis Mother 78  . Stroke Father 51  . Cancer Maternal Aunt     facial CA  . Heart attack Neg Hx   . Other Neg Hx     CV/HBP  . Hypertension Neg Hx     Social History:  reports that she has never smoked. She has never used smokeless tobacco. She reports that she does not drink alcohol or use drugs.  No Known  Allergies  MEDICATIONS:                                                                                                                     Scheduled: .  stroke: mapping our early stages of recovery book   Does not apply Once  . aspirin EC  81 mg Oral BID  . enoxaparin (LOVENOX) injection  40 mg Subcutaneous Q24H  . LORazepam  1 mg Intravenous Once  . oxybutynin  5 mg Oral BID     ROS:                                                                                                                                       As per HPI.   Blood pressure (!) 100/54, pulse 62, temperature 97.7 F (36.5 C), temperature source Oral, resp. rate 17, height 5\' 5"  (1.651 m), weight 44.9 kg (98 lb 15.8 oz), SpO2 95 %.  General Examination:                                                                                                      HEENT-  Normocephalic/atraumatic.  Extremities- Warm and well-perfused  Neurological Examination Mental Status: Drowsy. Able to follow commands. Limited speech output is fluent. Exam confounded by recent sedation for MRI.  Cranial Nerves: II: Visual fields grossly normal to bedside confrontation, PERRL III,IV, VI: ptosis not present, eyes dysconjugate in the context of recent sedation. Gazes to left and right on command.  V,VII: Left facial droop. Reacts to FT stimulation bilaterally.  VIII: Intact to conversation.  IX,X: No hypophonia XI: No gross asymmetry XII: midline tongue  extension Motor: RUE and RLE 5/5 LUE 4-/5  LLE 4/5 Sensory: Reacts to FT in all 4 extremities.  Deep Tendon Reflexes: Mildly hyperactive x 4.  Cerebellar: No ataxia with FNF bilaterally.  Gait: Deferred due to drowsiness.    Lab Results: Basic Metabolic Panel:  Recent Labs Lab 10/26/16 1700  NA 143  K 3.5  CL 111  CO2 24  GLUCOSE 96  BUN 13  CREATININE 0.75  CALCIUM 8.4*    Liver Function Tests:  Recent Labs Lab 10/26/16 1700  AST 20  ALT 6*  ALKPHOS 55  BILITOT 0.6  PROT 5.4*  ALBUMIN 2.6*   No results for input(s): LIPASE, AMYLASE in the last 168 hours. No results for input(s): AMMONIA in the last 168 hours.  CBC:  Recent Labs Lab 10/26/16 1700  WBC 6.5  NEUTROABS 5.0  HGB 12.1  HCT 36.5  MCV 95.3  PLT 211    Cardiac Enzymes:  Recent Labs Lab 10/26/16 2314  TROPONINI 0.04*    Lipid Panel: No results for input(s): CHOL, TRIG, HDL, CHOLHDL, VLDL, LDLCALC in the last 168 hours.  CBG: No results for input(s): GLUCAP in the last 168 hours.  Microbiology: Results for orders placed or performed in visit on 03/30/15  Urine culture     Status: None   Collection Time: 03/31/15  9:24 AM  Result Value Ref Range Status   Culture KLEBSIELLA PNEUMONIAE  Final   Colony Count >=100,000 COLONIES/ML  Final   Organism ID, Bacteria KLEBSIELLA PNEUMONIAE  Final      Susceptibility   Klebsiella pneumoniae -  (no method available)    AMPICILLIN >=32 Resistant     AMOX/CLAVULANIC 8 Sensitive     AMPICILLIN/SULBACTAM >=32 Resistant     PIP/TAZO 32 Intermediate     IMIPENEM <=0.25 Sensitive     CEFAZOLIN 16 Intermediate     CEFTRIAXONE <=1 Sensitive     CEFTAZIDIME <=1 Sensitive     CEFEPIME <=1 Sensitive     GENTAMICIN <=1 Sensitive     TOBRAMYCIN <=1 Sensitive     CIPROFLOXACIN <=0.25 Sensitive     LEVOFLOXACIN <=0.12 Sensitive     NITROFURANTOIN 128 Resistant     TRIMETH/SULFA <=20 Sensitive     Coagulation Studies: No results for input(s):  LABPROT, INR in the last 72 hours.  Imaging: Dg Chest 2 View  Result Date: 10/26/2016 CLINICAL DATA:  LEFT side weakness, confusion EXAM: CHEST  2 VIEW COMPARISON:  01/15/2016 FINDINGS: Enlargement of cardiac silhouette. Mediastinal contours and pulmonary vascularity normal. Atherosclerotic calcification aorta. Emphysematous and minimal bronchitic changes question COPD. No acute infiltrate, pleural effusion, or pneumothorax. Marked osseous demineralization. IMPRESSION: Enlargement of cardiac silhouette. Question COPD changes without acute infiltrate. Aortic atherosclerosis. Electronically Signed   By: Lavonia Dana M.D.   On: 10/26/2016 17:39   Ct Head Wo Contrast  Result Date: 10/26/2016 CLINICAL DATA:  Acute onset left-sided weakness yesterday. EXAM: CT HEAD WITHOUT CONTRAST TECHNIQUE: Contiguous axial images were obtained from the base of the skull through the vertex without intravenous contrast. COMPARISON:  Brain MRI 05/26/2007 and CT 05/25/2007 FINDINGS: Brain: There is no evidence of acute cortical infarct, intracranial hemorrhage, mass, midline shift, or extra-axial fluid collection. There is a small inferior right parietal MCA territory cortical infarct which is new from the prior studies and chronic in appearance. Chronic lacunar infarcts are present in the basal ganglia bilaterally and have increased in the interim. Periventricular white matter hypodensities have also mildly progressed and are compatible with chronic small vessel ischemic disease. There is a new small to moderate-sized right cerebellar PICA territory infarct which appears chronic. A new right thalamic lacunar infarcts is also felt to be chronic. Cerebral atrophy has progressed, most notable in the perisylvian regions. A partially empty sella is unchanged. Vascular: Calcified atherosclerosis in the carotid siphons. No hyperdense vessel. Skull: No fracture  or focal osseous lesion. Sinuses/Orbits: Paranasal sinuses and mastoid air  cells are clear. Prior bilateral cataract extraction. Other: None. IMPRESSION: 1. No evidence of acute intracranial abnormality. 2. Interval chronic infarcts in the right parietal lobe, right cerebellum, bilateral basal ganglia, and right thalamus. Electronically Signed   By: Logan Bores M.D.   On: 10/26/2016 18:11   Mr Brain Wo Contrast  Result Date: 10/26/2016 CLINICAL DATA:  83 y/o  F; left-sided weakness. EXAM: MRI HEAD WITHOUT CONTRAST TECHNIQUE: Multiplanar, multiecho pulse sequences of the brain and surrounding structures were obtained without intravenous contrast. COMPARISON:  10/26/2016 CT head.  05/26/2007 MRI of the head. FINDINGS: Brain: Small focus of diffusion restriction within the right lateral parietal lobe and additional small focus in the right posterior frontal area subcortical white matter compatible with acute/early subacute ischemia. Focus of encephalomalacia in the right inferior cerebellar hemisphere and right frontal parietal junction compatible chronic infarcts. Bilateral lentiform nuclei small chronic lacunar infarcts. Background of moderate chronic microvascular ischemic changes and parenchymal volume loss of the brain. Focus of susceptibility hypointensity within the right brachium pontis and punctate foci and basal ganglia are stable and compatible with hemosiderin deposition from old microhemorrhage. No new susceptibility hypointensity to indicate intracranial hemorrhage. No hydrocephalus, extra-axial collection, or focal mass effect. Incidental note of partially empty sella turcica. Vascular: Normal flow voids. Right posterior frontal developmental venous anomaly. Skull and upper cervical spine: Normal marrow signal. Sinuses/Orbits: Mild mucosal thickening of the ethmoid air cells. Otherwise no abnormal signal of paranasal sinuses or mastoid air cells. Bilateral intra-ocular lens replacement. Other: None. IMPRESSION: 1. Small acute/early subacute infarct within the right lateral  parietal lobe and additional punctate infarct in the right posterior frontal subcortical white matter. No associated hemorrhage. 2. Chronic infarcts in the right inferior cerebellar hemisphere and right frontal parietal junction as well as small chronic lacunar infarcts in the bilateral basal ganglia. 3. Moderate chronic microvascular ischemic changes and parenchymal volume loss of the brain. These results were called by telephone at the time of interpretation on 10/26/2016 at 10:14 pm to Dr. Blanchie Dessert , who verbally acknowledged these results. Electronically Signed   By: Kristine Garbe M.D.   On: 10/26/2016 22:17   Mr Jodene Nam Head/brain [id number] Cm  Result Date: 10/27/2016 CLINICAL DATA:  Confusion.  Left arm and leg weakness. EXAM: MRA HEAD WITHOUT CONTRAST TECHNIQUE: Angiographic images of the Circle of Willis were obtained using MRA technique without intravenous contrast. COMPARISON:  Brain MRI 10/26/2016 Head CT 10/26/2016 FINDINGS: Focal signal abnormality within the subarachnoid space overlying the right parietal lobe. Intracranial internal carotid arteries: Normal. Anterior cerebral arteries: Normal. Middle cerebral arteries: Normal. Posterior communicating arteries: Present bilaterally. Posterior cerebral arteries: Bilateral fetal origins. Basilar artery: Diminutive basilar artery is likely a congenital variant given the presence of bilateral fetal origins of the posterior cerebral arteries. Vertebral arteries: Left dominant. Normal. Superior cerebellar arteries: Normal. Anterior inferior cerebellar arteries: Normal. Posterior inferior cerebellar arteries: Normal. IMPRESSION: 1. No occlusion or high-grade stenosis of the intracranial arteries. 2. Focal signal abnormality within one of the right parietal sulci. This is indeterminate on this time-of-flight examination, but CT is recommended to exclude the possibility of subarachnoid hemorrhage at this location. These results will be called to the  ordering clinician or representative by the Radiologist Assistant, and communication documented in the PACS or zVision Dashboard. Electronically Signed   By: Ulyses Jarred M.D.   On: 10/27/2016 05:07    Assessment: 1. Subacute distal right MCA ischemic infarction. Involves  the motor tracts, concordant with examination findings. Underlying etiology cardioembolic versus artery to artery embolization versus paradoxical embolization.  2. MRA head reveals no occlusion or high grade stenosis.  3. See my follow up note regarding my interpretation of the right parietal sulcus finding on MRA head, which was described in the official radiology report.  3. B12 deficiency anemia. B12 is supplemented with scheduled injections as an outpatient.   Recommendations: 1. TTE.  2. Telemetry.  3. PT/OT/Speech.  4. Carotid ultrasound.  5. Out of permissive HTN time window.  6. Switch ASA to Plavix. Classifiable as having failed ASA therapy.  7. Given her age, benefits of statin most likely outweighed by risks.  8. Discussed with family.   Electronically signed: Dr. Kerney Elbe 10/27/2016, 5:54 AM

## 2016-10-27 NOTE — Progress Notes (Signed)
PROGRESS NOTE  [name] [name] [id number] DOB: 11/27/1925 DOA: 10/26/2016 PCP: Owens Loffler, MD  HPI/Recap of past 24 hours: Patient is lethargic, but able to answer question appropriately, Two daughter in room  Assessment/Plan: Principal Problem:   CVA (cerebral vascular accident) [facility]) Active Problems:   Essential hypertension   Aneurysm of thoracic aorta (HCC)   Elevated troponin   Protein-calorie malnutrition, severe (HCC)  Confusion, metabolic encephalopathy  Likely from acute cva and uti, treat underline causes. Aspiration precaution   CVA she is started on plavix.  Allow permissive hypertension, home bp meds held for now cva work up in process,  will follow neuro recommendations.   Addendum: repeat ct head ordered this am due to family concerned about her confusion getting worse after initial improvement.   UTI: no leukocytosis, no fever, she does has confusion, urine culture in process, start rocephin empirically  Code Status: DNR  Family Communication: patient and two daughters in room  Disposition Plan: home 1-2 days ( family declined inpatient rehab or palcement)   Consultants:  neurology  Procedures:  none  Antibiotics:  rocephin   Objective: BP (!) 100/54 (BP Location: Right Arm)   Pulse 62   Temp 97.7 F (36.5 C) (Oral)   Resp 17   Ht 5\' 5"  (1.651 m)   Wt 44.9 kg (98 lb 15.8 oz)   SpO2 95%   BMI 16.47 kg/m   Intake/Output Summary (Last 24 hours) at 10/27/16 0815 Last data filed at 10/27/16 0354  Gross per 24 hour  Intake           163.33 ml  Output                0 ml  Net           163.33 ml   Filed Weights   10/26/16 1624 10/26/16 2306  Weight: 45.4 kg (100 lb) 44.9 kg (98 lb 15.8 oz)    Exam:   General: frail and  Lethargic, slow to answer questions but oriented x3  Cardiovascular: RRR with ectopic beats  Respiratory: CTABL  Abdomen: Soft/ND/NT, positive BS  Musculoskeletal: No Edema  Neuro: subtle  neurologic defect including subtle left facial droop and subtle weakness on the left compare to the right  Data Reviewed: Basic Metabolic Panel:  Recent Labs Lab 10/26/16 1700  NA 143  K 3.5  CL 111  CO2 24  GLUCOSE 96  BUN 13  CREATININE 0.75  CALCIUM 8.4*   Liver Function Tests:  Recent Labs Lab 10/26/16 1700  AST 20  ALT 6*  ALKPHOS 55  BILITOT 0.6  PROT 5.4*  ALBUMIN 2.6*   No results for input(s): LIPASE, AMYLASE in the last 168 hours. No results for input(s): AMMONIA in the last 168 hours. CBC:  Recent Labs Lab 10/26/16 1700  WBC 6.5  NEUTROABS 5.0  HGB 12.1  HCT 36.5  MCV 95.3  PLT 211   Cardiac Enzymes:    Recent Labs Lab 10/26/16 2314 10/27/16 0610  TROPONINI 0.04* 0.04*   BNP (last 3 results) No results for input(s): BNP in the last 8760 hours.  ProBNP (last 3 results) No results for input(s): PROBNP in the last 8760 hours.  CBG: No results for input(s): GLUCAP in the last 168 hours.  No results found for this or any previous visit (from the past 240 hour(s)).   Studies: Dg Chest 2 View  Result Date: 10/26/2016 CLINICAL DATA:  LEFT side weakness, confusion EXAM: CHEST  2  VIEW COMPARISON:  01/15/2016 FINDINGS: Enlargement of cardiac silhouette. Mediastinal contours and pulmonary vascularity normal. Atherosclerotic calcification aorta. Emphysematous and minimal bronchitic changes question COPD. No acute infiltrate, pleural effusion, or pneumothorax. Marked osseous demineralization. IMPRESSION: Enlargement of cardiac silhouette. Question COPD changes without acute infiltrate. Aortic atherosclerosis. Electronically Signed   By: Lavonia Dana M.D.   On: 10/26/2016 17:39   Ct Head Wo Contrast  Result Date: 10/26/2016 CLINICAL DATA:  Acute onset left-sided weakness yesterday. EXAM: CT HEAD WITHOUT CONTRAST TECHNIQUE: Contiguous axial images were obtained from the base of the skull through the vertex without intravenous contrast. COMPARISON:  Brain  MRI 05/26/2007 and CT 05/25/2007 FINDINGS: Brain: There is no evidence of acute cortical infarct, intracranial hemorrhage, mass, midline shift, or extra-axial fluid collection. There is a small inferior right parietal MCA territory cortical infarct which is new from the prior studies and chronic in appearance. Chronic lacunar infarcts are present in the basal ganglia bilaterally and have increased in the interim. Periventricular white matter hypodensities have also mildly progressed and are compatible with chronic small vessel ischemic disease. There is a new small to moderate-sized right cerebellar PICA territory infarct which appears chronic. A new right thalamic lacunar infarcts is also felt to be chronic. Cerebral atrophy has progressed, most notable in the perisylvian regions. A partially empty sella is unchanged. Vascular: Calcified atherosclerosis in the carotid siphons. No hyperdense vessel. Skull: No fracture or focal osseous lesion. Sinuses/Orbits: Paranasal sinuses and mastoid air cells are clear. Prior bilateral cataract extraction. Other: None. IMPRESSION: 1. No evidence of acute intracranial abnormality. 2. Interval chronic infarcts in the right parietal lobe, right cerebellum, bilateral basal ganglia, and right thalamus. Electronically Signed   By: Logan Bores M.D.   On: 10/26/2016 18:11   Mr Brain Wo Contrast  Result Date: 10/26/2016 CLINICAL DATA:  83 y/o  F; left-sided weakness. EXAM: MRI HEAD WITHOUT CONTRAST TECHNIQUE: Multiplanar, multiecho pulse sequences of the brain and surrounding structures were obtained without intravenous contrast. COMPARISON:  10/26/2016 CT head.  05/26/2007 MRI of the head. FINDINGS: Brain: Small focus of diffusion restriction within the right lateral parietal lobe and additional small focus in the right posterior frontal area subcortical white matter compatible with acute/early subacute ischemia. Focus of encephalomalacia in the right inferior cerebellar  hemisphere and right frontal parietal junction compatible chronic infarcts. Bilateral lentiform nuclei small chronic lacunar infarcts. Background of moderate chronic microvascular ischemic changes and parenchymal volume loss of the brain. Focus of susceptibility hypointensity within the right brachium pontis and punctate foci and basal ganglia are stable and compatible with hemosiderin deposition from old microhemorrhage. No new susceptibility hypointensity to indicate intracranial hemorrhage. No hydrocephalus, extra-axial collection, or focal mass effect. Incidental note of partially empty sella turcica. Vascular: Normal flow voids. Right posterior frontal developmental venous anomaly. Skull and upper cervical spine: Normal marrow signal. Sinuses/Orbits: Mild mucosal thickening of the ethmoid air cells. Otherwise no abnormal signal of paranasal sinuses or mastoid air cells. Bilateral intra-ocular lens replacement. Other: None. IMPRESSION: 1. Small acute/early subacute infarct within the right lateral parietal lobe and additional punctate infarct in the right posterior frontal subcortical white matter. No associated hemorrhage. 2. Chronic infarcts in the right inferior cerebellar hemisphere and right frontal parietal junction as well as small chronic lacunar infarcts in the bilateral basal ganglia. 3. Moderate chronic microvascular ischemic changes and parenchymal volume loss of the brain. These results were called by telephone at the time of interpretation on 10/26/2016 at 10:14 pm to Dr. Blanchie Dessert ,  who verbally acknowledged these results. Electronically Signed   By: Kristine Garbe M.D.   On: 10/26/2016 22:17   Mr Jodene Nam Head/brain [id number] Cm  Result Date: 10/27/2016 CLINICAL DATA:  Confusion.  Left arm and leg weakness. EXAM: MRA HEAD WITHOUT CONTRAST TECHNIQUE: Angiographic images of the Circle of Willis were obtained using MRA technique without intravenous contrast. COMPARISON:  Brain MRI 10/26/2016  Head CT 10/26/2016 FINDINGS: Focal signal abnormality within the subarachnoid space overlying the right parietal lobe. Intracranial internal carotid arteries: Normal. Anterior cerebral arteries: Normal. Middle cerebral arteries: Normal. Posterior communicating arteries: Present bilaterally. Posterior cerebral arteries: Bilateral fetal origins. Basilar artery: Diminutive basilar artery is likely a congenital variant given the presence of bilateral fetal origins of the posterior cerebral arteries. Vertebral arteries: Left dominant. Normal. Superior cerebellar arteries: Normal. Anterior inferior cerebellar arteries: Normal. Posterior inferior cerebellar arteries: Normal. IMPRESSION: 1. No occlusion or high-grade stenosis of the intracranial arteries. 2. Focal signal abnormality within one of the right parietal sulci. This is indeterminate on this time-of-flight examination, but CT is recommended to exclude the possibility of subarachnoid hemorrhage at this location. These results will be called to the ordering clinician or representative by the Radiologist Assistant, and communication documented in the PACS or zVision Dashboard. Electronically Signed   By: Ulyses Jarred M.D.   On: 10/27/2016 05:07    Scheduled Meds: .  stroke: mapping our early stages of recovery book   Does not apply Once  . aspirin EC  81 mg Oral BID  . enoxaparin (LOVENOX) injection  40 mg Subcutaneous Q24H  . LORazepam  1 mg Intravenous Once  . oxybutynin  5 mg Oral BID    Continuous Infusions: . sodium chloride 50 mL/hr at 10/27/16 0354     Time spent: 54mins  Aija Scarfo MD, PhD  [facility] Pager [phone]. If 7PM-7AM, please contact night-coverage at [email or web address], password [facility] 10/27/2016, 8:15 AM  LOS: 1 day

## 2016-10-28 DIAGNOSIS — G9341 Metabolic encephalopathy: Secondary | ICD-10-CM

## 2016-10-28 LAB — URINE CULTURE

## 2016-10-28 LAB — BASIC METABOLIC PANEL
Anion gap: 8 (ref 5–15)
BUN: 5 mg/dL — AB (ref 6–20)
CHLORIDE: 110 mmol/L (ref 101–111)
CO2: 25 mmol/L (ref 22–32)
Calcium: 8.2 mg/dL — ABNORMAL LOW (ref 8.9–10.3)
Creatinine, Ser: 0.77 mg/dL (ref 0.44–1.00)
GFR calc Af Amer: >60 mL/min (ref >60)
GFR calc non Af Amer: >60 mL/min (ref >60)
GLUCOSE: 79 mg/dL (ref 65–99)
POTASSIUM: 3.3 mmol/L — AB (ref 3.5–5.1)
Sodium: 143 mmol/L (ref 135–145)

## 2016-10-28 LAB — MAGNESIUM: Magnesium: 1.7 mg/dL (ref 1.7–2.4)

## 2016-10-28 MED ORDER — CLOPIDOGREL BISULFATE 75 MG PO TABS: 75.0000 mg | ORAL_TABLET | Freq: Every day | ORAL | 0 refills | Status: DC

## 2016-10-28 MED ORDER — METOPROLOL TARTRATE 25 MG PO TABS: 12.5000 mg | ORAL_TABLET | Freq: Two times a day (BID) | ORAL | 0 refills | Status: DC

## 2016-10-28 MED ORDER — BOOST / RESOURCE BREEZE PO LIQD: 1.0000 | Freq: Three times a day (TID) | ORAL | 0 refills | Status: DC

## 2016-10-28 MED ORDER — SULFAMETHOXAZOLE-TRIMETHOPRIM 800-160 MG PO TABS: 1.0000 | ORAL_TABLET | Freq: Two times a day (BID) | ORAL | 0 refills | Status: AC

## 2016-10-28 MED ORDER — ATORVASTATIN CALCIUM 10 MG PO TABS: 10.0000 mg | ORAL_TABLET | Freq: Every day | ORAL | 0 refills | Status: DC

## 2016-10-28 MED ORDER — POTASSIUM CHLORIDE CRYS ER 20 MEQ PO TBCR
40.0000 meq | EXTENDED_RELEASE_TABLET | Freq: Once | ORAL | Status: AC
  Administered 2016-10-28: 40 meq via ORAL
  Filled 2016-10-28: qty 2

## 2016-10-28 MED ORDER — LEVOTHYROXINE SODIUM 25 MCG PO TABS: 25.0000 ug | ORAL_TABLET | Freq: Every day | ORAL | 0 refills | Status: DC

## 2016-10-28 NOTE — Care Management Note (Signed)
Case Management Note  Patient Details  Name: [name] [name] MRN: [id number] Date of Birth: 1926-07-30  Subjective/Objective:           Spoke with patient and daughter at bedside. Patient lives at home with husband who is 38, they have been married over 78 years. Four children live very close by, this daughter states she has been staying at their house overnight until mid morning. Other sibling help to provide supervision rest of day. Patient has BSC, rolator, cane, tub seat. Deny need for additional DME. Family would like HH with [facility], verified they accept insurance, refrral made to [facility] clinical liaison. Admitted with UTI and CVA, on rocephin and plavix.          Action/Plan:  DC to home with [facility] through [facility]. Would like RN and PT.  Expected Discharge Date:                  Expected Discharge Plan:  [place]  In-House Referral:     Discharge planning Services  CM Consult  Post Acute Care Choice:  Home Health Choice offered to:  Adult Children  DME Arranged:  N/A DME Agency:  NA  HH Arranged:  RN, PT HH Agency:  [facility]  Status of Service:  In process, will continue to follow  If discussed at Long Length of Stay Meetings, dates discussed:    Additional Comments:  Carles Collet, RN 10/28/2016, 11:38 AM

## 2016-10-28 NOTE — Discharge Summary (Signed)
Discharge Summary  [name] [name] [id number] DOB: 06-22-1926  PCP: Owens Loffler, MD  Admit date: 10/26/2016 Discharge date: 10/28/2016  Time spent: <19mins  Recommendations for Outpatient Follow-up:  1. F/u with PMD within a week  for hospital discharge follow up, repeat cbc/bmp at follow up, pmd to repeat tsh in 4-6 weeks 2. F/u with neurology stroke clinic Dr Leonie Man in 6 weeks  Discharge Diagnoses:  Active Hospital Problems   Diagnosis Date Noted  . CVA (cerebral vascular accident) ([facility]) 10/26/2016  . Elevated troponin 10/27/2016  . Protein-calorie malnutrition, severe ([facility]) 10/27/2016  . Essential hypertension 01/12/2009  . Aneurysm of thoracic aorta ([facility]) 01/12/2009    Resolved Hospital Problems   Diagnosis Date Noted Date Resolved  No resolved problems to display.    Discharge Condition: stable  Diet recommendation: heart healthy  Filed Weights   10/26/16 1624 10/26/16 2306  Weight: 45.4 kg (100 lb) 44.9 kg (98 lb 15.8 oz)    History of present illness:   PCP: Owens Loffler, MD Consultants:  Cardiologist retired Patient coming from: home - lives with husband; daughters live next door; NOK: daughter, [name] [name], [phone]  Chief Complaint: possible stroke  HPI: [name] [name] is a 83 y.o. female with medical history significant of remote TIA, HTN, COPD, and AAA presenting with possible CVA.  Yesterday, she had gradual symptoms.  Was not her usual self.  Daughter realized that things weren't right.  +confusion, difficulty getting things straight.  She kept asking to go home, "sounded like something coming out of an old person's head", she was already at home.  Increasing confusion.  Left arm and leg weakness that started yesterday.  Slight left facial droop, subtle.  ?slight slurring vs. Weakness.  +dysphagia for up to a week, trouble swallowing "heavier foods."    Has chronic headaches.     ED Course: Per Dr. Maryan Rued: Patient is an elderly female  presenting today with new onset weakness that started yesterday around lunchtime. It is pronounced in the left side face, arm and leg. She said no infectious symptoms, medication changes, falls or trauma to the head. She takes no anticoagulation. Prior history of TIA but no complete stroke in the past. Patient has no complaints of pain denies any chest symptoms. Low suspicion for dissection, infection or medication related. Concern for stroke however patient is outside of the window because symptoms started yesterday at 11 AM.  Stroke workup initiated. Currently hemodynamically stable, protecting her airway, no aphasia or swallowing difficulties.  7:06 PM  Labs without significant findings. EKG without signs of atrial fibrillation. CT of the head showing new infarct since last CT but unclear if it is chronic. Pt still having significant left sided weakness. Discussed patient with neurology patient will be admitted for stroke workup.  Hospital Course:  Principal Problem:   CVA (cerebral vascular accident) [facility]) Active Problems:   Essential hypertension   Aneurysm of thoracic aorta (H[facility])   Elevated troponin   Protein-calorie malnutrition, severe ([facility])   Confusion, metabolic encephalopathy  Likely from acute cva and uti, treat underline causes. Aspiration precaution Resolved, back to baseline   CVA Right lateral parietal lobe and right posterior frontal lobe subcortical white matter small infarcts  secondary to small vessel disease  she is started on plavix.  Allow permissive hypertension, home bp meds held in the hospital She is cleared to discharge home on plavix and lipitor (ldl 76) and outpatient neuro follow up in 6 weeks. Family declined rehab, home health arranged  UTI: no leukocytosis, no fever, she does has confusion, urine culture with multiple sepcies, she is treated with rocephin empirically in the hospital, she is discharged on bactrium to finish total of 5 day  treatment.  HTN:  Her blood pressure at low normal without home bp meds. She is discharged on reduced dose lopressor, she is instructed to check blood pressure at home and continue titrate blood pressure meds with pmd  Hypothyroidism:  she was not on meds before. tsh 6.8,  she is started on low dose synthroid 31mcg daily, repeat tsh in 4-6 weeks  Code Status: DNR  Family Communication: patient and two daughters in room  Disposition Plan: home with home health ( family declined inpatient rehab or palcement)   Consultants:  neurology  Procedures:  none  Antibiotics:  rocephin   Discharge Exam: BP 102/70 (BP Location: Left Arm)   Pulse 100   Temp 97.8 F (36.6 C) (Oral)   Resp 18   Ht 5\' 5"  (1.651 m)   Wt 44.9 kg (98 lb 15.8 oz)   SpO2 97%   BMI 16.47 kg/m     General: frail, oriented x3, pleasant  Cardiovascular: RRR with ectopic beats  Respiratory: CTABL  Abdomen: Soft/ND/NT, positive BS  Musculoskeletal: No Edema  Neuro: subtle neurologic defect including subtle left facial droop and subtle weakness on the left compare to the right  Discharge Instructions You were cared for by a hospitalist during your hospital stay. If you have any questions about your discharge medications or the care you received while you were in the hospital after you are discharged, you can call the unit and asked to speak with the hospitalist on call if the hospitalist that took care of you is not available. Once you are discharged, your primary care physician will handle any further medical issues. Please note that NO REFILLS for any discharge medications will be authorized once you are discharged, as it is imperative that you return to your primary care physician (or establish a relationship with a primary care physician if you do not have one) for your aftercare needs so that they can reassess your need for medications and monitor your lab values.  Discharge Instructions     Diet - low sodium heart healthy    Complete by:  As directed    Face-to-face encounter (required for Medicare/Medicaid patients)    Complete by:  As directed    I Sumi Lye certify that this patient is under my care and that I, or a nurse practitioner or physician's assistant working with me, had a face-to-face encounter that meets the physician face-to-face encounter requirements with this patient on 10/28/2016. The encounter with the patient was in whole, or in part for the following medical condition(s) which is the primary reason for home health care (List medical condition): FTT   The encounter with the patient was in whole, or in part, for the following medical condition, which is the primary reason for home health care:  FTT   I certify that, based on my findings, the following services are medically necessary home health services:   Nursing Physical therapy     Reason for Medically Necessary Home Health Services:  Skilled Nursing- Change/Decline in Patient Status   My clinical findings support the need for the above services:  Unsafe ambulation due to balance issues   Further, I certify that my clinical findings support that this patient is homebound due to:  Unsafe ambulation due to balance issues   Home  Health    Complete by:  As directed    To provide the following care/treatments:   PT RN     Increase activity slowly    Complete by:  As directed      Allergies as of 10/28/2016   No Known Allergies     Medication List    STOP taking these medications   aspirin 81 MG tablet     TAKE these medications   ANACIN 400-32 MG Tabs Generic drug:  Aspirin-Caffeine Take 2 tablets by mouth 2 (two) times daily.   atorvastatin 10 MG tablet Commonly known as:  LIPITOR Take 1 tablet (10 mg total) by mouth daily.   clopidogrel 75 MG tablet Commonly known as:  PLAVIX Take 1 tablet (75 mg total) by mouth daily. Start taking on:  10/29/2016   cyanocobalamin 1000 MCG/ML  injection Commonly known as:  (VITAMIN B-12) INJECT 1 ML INTERMUSCLUARLLY ONCE A MONTH.   feeding supplement Liqd Take 1 Container by mouth 3 (three) times daily between meals.   levothyroxine 25 MCG tablet Commonly known as:  SYNTHROID, LEVOTHROID Take 1 tablet (25 mcg total) by mouth daily before breakfast.   metoprolol tartrate 25 MG tablet Commonly known as:  LOPRESSOR Take 0.5 tablets (12.5 mg total) by mouth 2 (two) times daily. What changed:  medication strength  how much to take  how to take this  when to take this  additional instructions   multivitamin with minerals Tabs tablet Take 1 tablet by mouth daily.   nystatin cream Commonly known as:  MYCOSTATIN APPLY 1 APPLICATION TOPICALLY 2 (TWO) TIMES DAILY What changed:  when to take this  reasons to take this   oxybutynin 5 MG tablet Commonly known as:  DITROPAN TAKE 1 TABLET TWICE DAILY   PRESERVISION AREDS 2 Caps Take 1 capsule by mouth 2 (two) times daily.   sulfamethoxazole-trimethoprim 800-160 MG tablet Commonly known as:  BACTRIM DS,SEPTRA DS Take 1 tablet by mouth 2 (two) times daily.      No Known Allergies Follow-up Information    [facility] Follow up.   Why:  For home health. They will initiate contact 1-2 days after discharge.  Contact information: [place] [address] [phone]        Spencer Copland, MD Follow up in 2 week(s).   Specialty:  Family Medicine Why:  hospital follow up, repeat tsh (thyroid function) in 4-6 weeks.  check blood pressure at home and continue titrate blood pressure meds with pmd  Contact information: [place] [address] [phone]        SETHI,PRAMOD, MD Follow up in 6 week(s).   Specialties:  Neurology, Radiology Why:  stroke follow up Contact information: [address] [place] [address] [phone]            The results of significant  diagnostics from this hospitalization (including imaging, microbiology, ancillary and laboratory) are listed below for reference.    Significant Diagnostic Studies: Dg Chest 2 View  Result Date: 10/26/2016 CLINICAL DATA:  LEFT side weakness, confusion EXAM: CHEST  2 VIEW COMPARISON:  01/15/2016 FINDINGS: Enlargement of cardiac silhouette. Mediastinal contours and pulmonary vascularity normal. Atherosclerotic calcification aorta. Emphysematous and minimal bronchitic changes question COPD. No acute infiltrate, pleural effusion, or pneumothorax. Marked osseous demineralization. IMPRESSION: Enlargement of cardiac silhouette. Question COPD changes without acute infiltrate. Aortic atherosclerosis. Electronically Signed   By: Lavonia Dana M.D.   On: 10/26/2016 17:39  Ct Head Wo Contrast  Result Date: 10/27/2016 CLINICAL DATA:  Lethargy. Pt unable to provide hx. EXAM: CT HEAD WITHOUT CONTRAST TECHNIQUE: Contiguous axial images were obtained from the base of the skull through the vertex without intravenous contrast. COMPARISON:  Head CT 10/26/2016 FINDINGS: Brain: No acute intracranial hemorrhage. No focal mass lesion. No CT evidence of acute infarction. No midline shift or mass effect. No hydrocephalus. Basilar cisterns are patent. Subcortical infarction in the high RIGHT frontal lobe compares to recent MRI findings. Remote infarction in the RIGHT caudate nucleus/internal capsule. RIGHT cerebellar infarction also noted. There are periventricular and subcortical white matter hypodensities. Generalized cortical atrophy. Vascular: No hyperdense vessel or unexpected calcification. Skull: Normal. Negative for fracture or focal lesion. Sinuses/Orbits: Paranasal sinuses and mastoid air cells are clear. Orbits are clear. Other: None. IMPRESSION: 1. No acute intracranial findings. 2. RIGHT sided infarctions unchanged from comparison MRI. Electronically Signed   By: Suzy Bouchard M.D.   On: 10/27/2016 12:07   Ct Head Wo  Contrast  Result Date: 10/26/2016 CLINICAL DATA:  Acute onset left-sided weakness yesterday. EXAM: CT HEAD WITHOUT CONTRAST TECHNIQUE: Contiguous axial images were obtained from the base of the skull through the vertex without intravenous contrast. COMPARISON:  Brain MRI 05/26/2007 and CT 05/25/2007 FINDINGS: Brain: There is no evidence of acute cortical infarct, intracranial hemorrhage, mass, midline shift, or extra-axial fluid collection. There is a small inferior right parietal MCA territory cortical infarct which is new from the prior studies and chronic in appearance. Chronic lacunar infarcts are present in the basal ganglia bilaterally and have increased in the interim. Periventricular white matter hypodensities have also mildly progressed and are compatible with chronic small vessel ischemic disease. There is a new small to moderate-sized right cerebellar PICA territory infarct which appears chronic. A new right thalamic lacunar infarcts is also felt to be chronic. Cerebral atrophy has progressed, most notable in the perisylvian regions. A partially empty sella is unchanged. Vascular: Calcified atherosclerosis in the carotid siphons. No hyperdense vessel. Skull: No fracture or focal osseous lesion. Sinuses/Orbits: Paranasal sinuses and mastoid air cells are clear. Prior bilateral cataract extraction. Other: None. IMPRESSION: 1. No evidence of acute intracranial abnormality. 2. Interval chronic infarcts in the right parietal lobe, right cerebellum, bilateral basal ganglia, and right thalamus. Electronically Signed   By: Logan Bores M.D.   On: 10/26/2016 18:11   Mr Brain Wo Contrast  Result Date: 10/26/2016 CLINICAL DATA:  83 y/o  F; left-sided weakness. EXAM: MRI HEAD WITHOUT CONTRAST TECHNIQUE: Multiplanar, multiecho pulse sequences of the brain and surrounding structures were obtained without intravenous contrast. COMPARISON:  10/26/2016 CT head.  05/26/2007 MRI of the head. FINDINGS: Brain: Small  focus of diffusion restriction within the right lateral parietal lobe and additional small focus in the right posterior frontal area subcortical white matter compatible with acute/early subacute ischemia. Focus of encephalomalacia in the right inferior cerebellar hemisphere and right frontal parietal junction compatible chronic infarcts. Bilateral lentiform nuclei small chronic lacunar infarcts. Background of moderate chronic microvascular ischemic changes and parenchymal volume loss of the brain. Focus of susceptibility hypointensity within the right brachium pontis and punctate foci and basal ganglia are stable and compatible with hemosiderin deposition from old microhemorrhage. No new susceptibility hypointensity to indicate intracranial hemorrhage. No hydrocephalus, extra-axial collection, or focal mass effect. Incidental note of partially empty sella turcica. Vascular: Normal flow voids. Right posterior frontal developmental venous anomaly. Skull and upper cervical spine: Normal marrow signal. Sinuses/Orbits: Mild mucosal thickening of the ethmoid air  cells. Otherwise no abnormal signal of paranasal sinuses or mastoid air cells. Bilateral intra-ocular lens replacement. Other: None. IMPRESSION: 1. Small acute/early subacute infarct within the right lateral parietal lobe and additional punctate infarct in the right posterior frontal subcortical white matter. No associated hemorrhage. 2. Chronic infarcts in the right inferior cerebellar hemisphere and right frontal parietal junction as well as small chronic lacunar infarcts in the bilateral basal ganglia. 3. Moderate chronic microvascular ischemic changes and parenchymal volume loss of the brain. These results were called by telephone at the time of interpretation on 10/26/2016 at 10:14 pm to Dr. Blanchie Dessert , who verbally acknowledged these results. Electronically Signed   By: Kristine Garbe M.D.   On: 10/26/2016 22:17   Mr Jodene Nam Head/brain [id number]  Cm  Result Date: 10/27/2016 CLINICAL DATA:  Confusion.  Left arm and leg weakness. EXAM: MRA HEAD WITHOUT CONTRAST TECHNIQUE: Angiographic images of the Circle of Willis were obtained using MRA technique without intravenous contrast. COMPARISON:  Brain MRI 10/26/2016 Head CT 10/26/2016 FINDINGS: Focal signal abnormality within the subarachnoid space overlying the right parietal lobe. Intracranial internal carotid arteries: Normal. Anterior cerebral arteries: Normal. Middle cerebral arteries: Normal. Posterior communicating arteries: Present bilaterally. Posterior cerebral arteries: Bilateral fetal origins. Basilar artery: Diminutive basilar artery is likely a congenital variant given the presence of bilateral fetal origins of the posterior cerebral arteries. Vertebral arteries: Left dominant. Normal. Superior cerebellar arteries: Normal. Anterior inferior cerebellar arteries: Normal. Posterior inferior cerebellar arteries: Normal. IMPRESSION: 1. No occlusion or high-grade stenosis of the intracranial arteries. 2. Focal signal abnormality within one of the right parietal sulci. This is indeterminate on this time-of-flight examination, but CT is recommended to exclude the possibility of subarachnoid hemorrhage at this location. These results will be called to the ordering clinician or representative by the Radiologist Assistant, and communication documented in the PACS or zVision Dashboard. Electronically Signed   By: Ulyses Jarred M.D.   On: 10/27/2016 05:07    Microbiology: Recent Results (from the past 240 hour(s))  Culture, Urine     Status: Abnormal   Collection Time: 10/26/16 10:10 PM  Result Value Ref Range Status   Specimen Description URINE, RANDOM  Final   Special Requests NONE  Final   Culture MULTIPLE SPECIES PRESENT, SUGGEST RECOLLECTION (A)  Final   Report Status 10/28/2016 FINAL  Final     Labs: Basic Metabolic Panel:  Recent Labs Lab 10/26/16 1700 10/28/16 0703  NA 143 143  K  3.5 3.3*  CL 111 110  CO2 24 25  GLUCOSE 96 79  BUN 13 5*  CREATININE 0.75 0.77  CALCIUM 8.4* 8.2*  MG  --  1.7   Liver Function Tests:  Recent Labs Lab 10/26/16 1700  AST 20  ALT 6*  ALKPHOS 55  BILITOT 0.6  PROT 5.4*  ALBUMIN 2.6*   No results for input(s): LIPASE, AMYLASE in the last 168 hours. No results for input(s): AMMONIA in the last 168 hours. CBC:  Recent Labs Lab 10/26/16 1700  WBC 6.5  NEUTROABS 5.0  HGB 12.1  HCT 36.5  MCV 95.3  PLT 211   Cardiac Enzymes:  Recent Labs Lab 10/26/16 2314 10/27/16 0610 10/27/16 1030  TROPONINI 0.04* 0.04* 0.03*   BNP: BNP (last 3 results) No results for input(s): BNP in the last 8760 hours.  ProBNP (last 3 results) No results for input(s): PROBNP in the last 8760 hours.  CBG: No results for input(s): GLUCAP in the last 168 hours.  SignedFlorencia Reasons MD, PhD  [facility] 10/28/2016, 12:47 PM

## 2016-10-28 NOTE — Progress Notes (Signed)
STROKE TEAM PROGRESS NOTE    SUBJECTIVE (INTERVAL HISTORY) Her daughter Is at the bedside. Patient is more awake today, and would like to go home. Dr. Leonie Man discussed stroke diagnosis and care. They both verbalized understanding.    OBJECTIVE Temp:  [97.3 F (36.3 C)-98.3 F (36.8 C)] 97.8 F (36.6 C) (01/12 0540) Pulse Rate:  [73-100] 100 (01/12 1014) Cardiac Rhythm: Normal sinus rhythm (01/12 0700) Resp:  [17-20] 18 (01/12 1014) BP: (102-118)/(61-71) 102/70 (01/12 1014) SpO2:  [95 %-98 %] 97 % (01/12 1014)  CBC:   Recent Labs Lab 10/26/16 1700  WBC 6.5  NEUTROABS 5.0  HGB 12.1  HCT 36.5  MCV 95.3  PLT 211   He is on hold R Basic Metabolic Panel:   Recent Labs Lab 10/26/16 1700 10/28/16 0703  NA 143 143  K 3.5 3.3*  CL 111 110  CO2 24 25  GLUCOSE 96 79  BUN 13 5*  CREATININE 0.75 0.77  CALCIUM 8.4* 8.2*  MG  --  1.7    Lipid Panel:     Component Value Date/Time   CHOL 143 10/27/2016 0610   TRIG 95 10/27/2016 0610   HDL 48 10/27/2016 0610   CHOLHDL 3.0 10/27/2016 0610   VLDL 19 10/27/2016 0610   LDLCALC 76 10/27/2016 0610   HgbA1c:  Lab Results  Component Value Date   HGBA1C  05/27/2007    5.3 (NOTE)   The ADA recommends the following therapeutic goals for glycemic   control related to Hgb A1C measurement:   Goal of Therapy:   < 7.0% Hgb A1C   Action Suggested:  > 8.0% Hgb A1C   Ref:  Diabetes Care, 22, Suppl. 1, 1999   Urine Drug Screen: No results found for: LABOPIA, COCAINSCRNUR, LABBENZ, AMPHETMU, THCU, LABBARB    IMAGING  Dg Chest 2 View  Result Date: 10/26/2016 CLINICAL DATA:  LEFT side weakness, confusion EXAM: CHEST  2 VIEW COMPARISON:  01/15/2016 FINDINGS: Enlargement of cardiac silhouette. Mediastinal contours and pulmonary vascularity normal. Atherosclerotic calcification aorta. Emphysematous and minimal bronchitic changes question COPD. No acute infiltrate, pleural effusion, or pneumothorax. Marked osseous demineralization.  IMPRESSION: Enlargement of cardiac silhouette. Question COPD changes without acute infiltrate. Aortic atherosclerosis. Electronically Signed   By: Lavonia Dana M.D.   On: 10/26/2016 17:39   Ct Head Wo Contrast  Result Date: 10/27/2016 CLINICAL DATA:  Lethargy. Pt unable to provide hx. EXAM: CT HEAD WITHOUT CONTRAST TECHNIQUE: Contiguous axial images were obtained from the base of the skull through the vertex without intravenous contrast. COMPARISON:  Head CT 10/26/2016 FINDINGS: Brain: No acute intracranial hemorrhage. No focal mass lesion. No CT evidence of acute infarction. No midline shift or mass effect. No hydrocephalus. Basilar cisterns are patent. Subcortical infarction in the high RIGHT frontal lobe compares to recent MRI findings. Remote infarction in the RIGHT caudate nucleus/internal capsule. RIGHT cerebellar infarction also noted. There are periventricular and subcortical white matter hypodensities. Generalized cortical atrophy. Vascular: No hyperdense vessel or unexpected calcification. Skull: Normal. Negative for fracture or focal lesion. Sinuses/Orbits: Paranasal sinuses and mastoid air cells are clear. Orbits are clear. Other: None. IMPRESSION: 1. No acute intracranial findings. 2. RIGHT sided infarctions unchanged from comparison MRI. Electronically Signed   By: Suzy Bouchard M.D.   On: 10/27/2016 12:07   Ct Head Wo Contrast  Result Date: 10/26/2016 CLINICAL DATA:  Acute onset left-sided weakness yesterday. EXAM: CT HEAD WITHOUT CONTRAST TECHNIQUE: Contiguous axial images were obtained from the base of the skull through the  vertex without intravenous contrast. COMPARISON:  Brain MRI 05/26/2007 and CT 05/25/2007 FINDINGS: Brain: There is no evidence of acute cortical infarct, intracranial hemorrhage, mass, midline shift, or extra-axial fluid collection. There is a small inferior right parietal MCA territory cortical infarct which is new from the prior studies and chronic in appearance.  Chronic lacunar infarcts are present in the basal ganglia bilaterally and have increased in the interim. Periventricular white matter hypodensities have also mildly progressed and are compatible with chronic small vessel ischemic disease. There is a new small to moderate-sized right cerebellar PICA territory infarct which appears chronic. A new right thalamic lacunar infarcts is also felt to be chronic. Cerebral atrophy has progressed, most notable in the perisylvian regions. A partially empty sella is unchanged. Vascular: Calcified atherosclerosis in the carotid siphons. No hyperdense vessel. Skull: No fracture or focal osseous lesion. Sinuses/Orbits: Paranasal sinuses and mastoid air cells are clear. Prior bilateral cataract extraction. Other: None. IMPRESSION: 1. No evidence of acute intracranial abnormality. 2. Interval chronic infarcts in the right parietal lobe, right cerebellum, bilateral basal ganglia, and right thalamus. Electronically Signed   By: Logan Bores M.D.   On: 10/26/2016 18:11   Mr Brain Wo Contrast  Result Date: 10/26/2016 CLINICAL DATA:  83 y/o  F; left-sided weakness. EXAM: MRI HEAD WITHOUT CONTRAST TECHNIQUE: Multiplanar, multiecho pulse sequences of the brain and surrounding structures were obtained without intravenous contrast. COMPARISON:  10/26/2016 CT head.  05/26/2007 MRI of the head. FINDINGS: Brain: Small focus of diffusion restriction within the right lateral parietal lobe and additional small focus in the right posterior frontal area subcortical white matter compatible with acute/early subacute ischemia. Focus of encephalomalacia in the right inferior cerebellar hemisphere and right frontal parietal junction compatible chronic infarcts. Bilateral lentiform nuclei small chronic lacunar infarcts. Background of moderate chronic microvascular ischemic changes and parenchymal volume loss of the brain. Focus of susceptibility hypointensity within the right brachium pontis and  punctate foci and basal ganglia are stable and compatible with hemosiderin deposition from old microhemorrhage. No new susceptibility hypointensity to indicate intracranial hemorrhage. No hydrocephalus, extra-axial collection, or focal mass effect. Incidental note of partially empty sella turcica. Vascular: Normal flow voids. Right posterior frontal developmental venous anomaly. Skull and upper cervical spine: Normal marrow signal. Sinuses/Orbits: Mild mucosal thickening of the ethmoid air cells. Otherwise no abnormal signal of paranasal sinuses or mastoid air cells. Bilateral intra-ocular lens replacement. Other: None. IMPRESSION: 1. Small acute/early subacute infarct within the right lateral parietal lobe and additional punctate infarct in the right posterior frontal subcortical white matter. No associated hemorrhage. 2. Chronic infarcts in the right inferior cerebellar hemisphere and right frontal parietal junction as well as small chronic lacunar infarcts in the bilateral basal ganglia. 3. Moderate chronic microvascular ischemic changes and parenchymal volume loss of the brain. These results were called by telephone at the time of interpretation on 10/26/2016 at 10:14 pm to Dr. Blanchie Dessert , who verbally acknowledged these results. Electronically Signed   By: Kristine Garbe M.D.   On: 10/26/2016 22:17   Mr Jodene Nam Head/brain [id number] Cm  Result Date: 10/27/2016 CLINICAL DATA:  Confusion.  Left arm and leg weakness. EXAM: MRA HEAD WITHOUT CONTRAST TECHNIQUE: Angiographic images of the Circle of Willis were obtained using MRA technique without intravenous contrast. COMPARISON:  Brain MRI 10/26/2016 Head CT 10/26/2016 FINDINGS: Focal signal abnormality within the subarachnoid space overlying the right parietal lobe. Intracranial internal carotid arteries: Normal. Anterior cerebral arteries: Normal. Middle cerebral arteries: Normal. Posterior communicating arteries: Present  bilaterally. Posterior cerebral  arteries: Bilateral fetal origins. Basilar artery: Diminutive basilar artery is likely a congenital variant given the presence of bilateral fetal origins of the posterior cerebral arteries. Vertebral arteries: Left dominant. Normal. Superior cerebellar arteries: Normal. Anterior inferior cerebellar arteries: Normal. Posterior inferior cerebellar arteries: Normal. IMPRESSION: 1. No occlusion or high-grade stenosis of the intracranial arteries. 2. Focal signal abnormality within one of the right parietal sulci. This is indeterminate on this time-of-flight examination, but CT is recommended to exclude the possibility of subarachnoid hemorrhage at this location. These results will be called to the ordering clinician or representative by the Radiologist Assistant, and communication documented in the PACS or zVision Dashboard. Electronically Signed   By: Ulyses Jarred M.D.   On: 10/27/2016 05:07   2-D echocardiogram - Left ventricle: There is akinesis of the basal and mid inferolateral and inferior walls. The cavity size was normal. Wall thickness was increased in a pattern of moderate LVH. There was moderate concentric hypertrophy. Systolic function was mildly reduced. The estimated ejection fraction was in the range of 40% to 45%. Wall motion was normal; there were no regional wall motion abnormalities. Doppler parameters are consistent with abnormal left ventricular relaxation (grade 1 diastolic dysfunction). Doppler parameters are consistent with elevated ventricular end-diastolic filling pressure. - Aortic valve: Trileaflet; severely thickened, severely calcified leaflets. Valve mobility was restricted. There was severe stenosis. There was mild regurgitation. Mean gradient (S): 39 mm Hg. Valve area (VTI): 0.86 cm^2. Valve area (Vmax): 0.76 cm^2. Valve area (Vmean): 0.74 cm^2. - Aortic root: The aortic root was dilated measuring 45 mm. - Ascending aorta: The ascending aorta was dilated measuring 50 mm. - Mitral  valve: There was trivial regurgitation. - Right ventricle: Systolic function was normal. - Tricuspid valve: There was mild regurgitation. - Pulmonic valve: There was mild regurgitation. - Pulmonary arteries: Systolic pressure was within the normal range. - Pericardium, extracardiac: There was no pericardial effusion. Impressions:   Compared to the prior study from 02/13/2013 there are significant changes:   1. Aortic stenosis is now severe   2. There are new wall motion abnormalities in the RCA territory   3. LVEF has decreased from 55-60% to 40-45%   4. Aortic root remains dilated at 45 mm, ascending aorta is   dilated at 50 mm, previously reported as 85mm.  Carotid Doppler Bilateral:  1-39% ICA stenosis.  Vertebral artery flow is antegrade.  Technically difficult due to tortuosity   PHYSICAL EXAM Pleasant  elderly lady not in distress. . Afebrile. Head is nontraumatic. Neck is supple without bruit.    Cardiac exam no murmur or gallop. Lungs are clear to auscultation. Distal pulses are well felt. Neurological Exam :  Awake alert oriented x 3 normal speech and language. Mild left lower face asymmetry. Tongue midline. No drift. Mild diminished fine finger movements on left. Orbits right over left upper extremity. Mild left grip weak.. Normal sensation . Normal coordination.  ASSESSMENT/PLAN [name] [name] is a 82 y.o. female with history of TIA, HTN, COPD, and AAA presenting with L sided facial droop and L arm weakness. She did not receive IV t-PA due to delay in arrival.   Stroke:  Right lateral parietal lobe and right posterior frontal lobe subcortical white matter small infarcts  secondary to small vessel disease    MRI  right lateral parietal lobe, punctate right posterior frontal subcortical white matter infarct. Old infarct right inferior cerebellar hemisphere and right frontal parietal junction, bilateral basal ganglia lacunae  MRA  no significant high-grade stenosis or  occlusion  Carotid Doppler  no significant stenosis  2D Echo  EF 40-45%. Severe aortic stenosis. Aortic root dilated. No source of embolus seen.  LDL 76  HgbA1c 5.3  Lovenox 30 mg sq daily for VTE prophylaxis Diet Heart Room service appropriate? Yes; Fluid consistency: Thin Diet - low sodium heart healthy  aspirin 81 mg daily prior to admission, changed to clopidogrel 75 mg daily  I hadPatient counseled to be compliant with her antithrombotic medications  Ongoing aggressive stroke risk factor management  Therapy recommendations:  HH PT, HH OT  Disposition:  Plan return home (independent furniture walker at home)  [place]  Patient has a 10-15% risk of having another stroke over the next year, the highest risk is within 2 weeks of the most recent stroke/TIA (risk of having a stroke following a stroke or TIA is the same).  Ongoing risk factor control by Primary Care Physician  Stroke Service will sign off. Please call should any needs arise.  Follow-up Stroke Clinic at [facility] with Dr. Antony Contras in 2 months, order placed.     Hypertension  Stable  Permissive hypertension (OK if < 220/120) but gradually normalize in 5-7 days  Long-term BP goal normotensive  Hyperlipidemia  Home meds:  No statin  LDL 76, goal < 70  Starting on low dose statin  Other Stroke Risk Factors  Advanced age  Family hx stroke (fahter)  Thoracic aortic aneurysm 16mm  Other Active Problems  Elevated troponin  Malnutrition   Mild hypokalemia 3.3, replaced this am  Hospital day # 2  Radene [facility] for Pager information 10/28/2016 12:58 PM  I have personally examined this patient, reviewed notes, independently viewed imaging studies, participated in medical decision making and plan of care.ROS completed by me personally and pertinent positives fully documented  I have made any additions or  clarifications directly to the above note. Agree with note above. Stroke team will sign off. Kindly call for questions. Follow-up in the stroke clinic in 6 weeks  Antony Contras, MD Medical Director [facility] Pager: [phone] 10/28/2016 2:50 PM  To contact Stroke Continuity provider, please refer to [email or web address] After hours, contact General Neurology

## 2016-10-30 LAB — VAS US CAROTID
LCCADDIAS: -17 cm/s
LCCAPSYS: 30 cm/s
LEFT ECA DIAS: -10 cm/s
LEFT VERTEBRAL DIAS: -9 cm/s
LICAPDIAS: -26 cm/s
Left CCA dist sys: -54 cm/s
Left CCA prox dias: 8 cm/s
Left ICA dist dias: -10 cm/s
Left ICA dist sys: -57 cm/s
Left ICA prox sys: -84 cm/s
RIGHT ECA DIAS: -2 cm/s
RIGHT VERTEBRAL DIAS: 7 cm/s
Right CCA prox dias: 7 cm/s
Right CCA prox sys: 31 cm/s
Right cca dist sys: -60 cm/s

## 2016-11-01 ENCOUNTER — Telehealth: Payer: Self-pay

## 2016-11-01 ENCOUNTER — Telehealth: Payer: Self-pay | Admitting: *Deleted

## 2016-11-01 DIAGNOSIS — Z8673 Personal history of transient ischemic attack (TIA), and cerebral infarction without residual deficits: Secondary | ICD-10-CM | POA: Diagnosis not present

## 2016-11-01 DIAGNOSIS — J449 Chronic obstructive pulmonary disease, unspecified: Secondary | ICD-10-CM | POA: Diagnosis not present

## 2016-11-01 DIAGNOSIS — D519 Vitamin B12 deficiency anemia, unspecified: Secondary | ICD-10-CM | POA: Diagnosis not present

## 2016-11-01 DIAGNOSIS — E43 Unspecified severe protein-calorie malnutrition: Secondary | ICD-10-CM | POA: Diagnosis not present

## 2016-11-01 DIAGNOSIS — I10 Essential (primary) hypertension: Secondary | ICD-10-CM | POA: Diagnosis not present

## 2016-11-01 DIAGNOSIS — Z7902 Long term (current) use of antithrombotics/antiplatelets: Secondary | ICD-10-CM | POA: Diagnosis not present

## 2016-11-01 DIAGNOSIS — I712 Thoracic aortic aneurysm, without rupture: Secondary | ICD-10-CM | POA: Diagnosis not present

## 2016-11-01 DIAGNOSIS — I69828 Other speech and language deficits following other cerebrovascular disease: Secondary | ICD-10-CM | POA: Diagnosis not present

## 2016-11-01 DIAGNOSIS — I69393 Ataxia following cerebral infarction: Secondary | ICD-10-CM | POA: Diagnosis not present

## 2016-11-01 NOTE — Telephone Encounter (Signed)
Transition Care Management Follow-up Telephone Call  Per Discharge Summary: PCP: Owens Loffler, MD  Admit date: 10/26/2016 Discharge date: 10/28/2016  Time spent: <1mins  Recommendations for Outpatient Follow-up:  1. F/u with PMD within a week  for hospital discharge follow up, repeat cbc/bmp at follow up, pmd to repeat tsh in 4-6 weeks 2. F/u with neurology stroke clinic Dr Leonie Man in 6 weeks  --  Call completed w/ pt's daughter, [name].   How have you been since you were released from the hospital? Daughter reports pt is doing okay. She had a hard time over the weekend because she was not sleeping well. They have since moved her from her bed to a recliner for sleep and this seems to be working much better for her.    Do you understand why you were in the hospital? yes   Do you understand the discharge instructions? yes   Where were you discharged to? Home   Items Reviewed:  Medications reviewed: yes  Allergies reviewed: yes  Dietary changes reviewed: yes, heart healthy  Referrals reviewed: yes, [facility] nurse coming today   Functional Questionnaire:   Activities of Daily Living (ADLs):   She states they are independent in the following: feeding, grooming and dressing States they require assistance with the following: ambulation, bathing and hygiene and toileting   Any transportation issues/concerns?: no   Any patient concerns? no   Confirmed importance and date/time of follow-up visits scheduled yes  Provider Appointment booked with Dr. Frederico Hamman Copland 11/09/16 @ 9:30am  Confirmed with patient if condition begins to worsen call PCP or go to the ER.  Patient was given the office number and encouraged to call back with question or concerns.  : yes

## 2016-11-01 NOTE — Telephone Encounter (Signed)
Yes, that sounds like a mobile chest x-ray is a good idea

## 2016-11-01 NOTE — Telephone Encounter (Signed)
She went to see her to do her home health assessment. She heard some crackles on the left lower lung. Was wondering if the Dr would approve a mobile Chest X-ray. She is completing a round of Bactrim for a UTI tonight.

## 2016-11-02 ENCOUNTER — Other Ambulatory Visit: Payer: Self-pay

## 2016-11-02 NOTE — Patient Outreach (Signed)
[facility]) Care Management  11/02/2016  [place] Dec 25, 1925 [id number]  [facility]  Referral Date:  11/02/16 Source / Issue:  [facility] Red Alert -Scheduled a follow-up appointment? No  -Problems setting up rehab? Yes  [facility] Eligible:  yes PCP:  Owens Loffler, MD Neurologist:  Dr. Leonie Man - follow-up in 6 weeks.  HH:  [facility]   Subjective;  [place] [phone] (H)  Contact/patients daughter states other sister, Dorian Pod is HCPOA and can be reached at [phone]. States patient is able to talk on the phone but does not hear very well and family manages her care.   Additional same day attempt to [facility], Daughter, Thurman Coyer.   Daughter reached and confirms issues have been resolved.  Admission 10/26/2016 - 10/28/2016  CVA (10/26/16), elevated troponin, protein-calorie malnutrition (severe) - current weight 100 lbs.  H/o HTN, Aneurysm of thoracic aorta.  -F/u with PMD within a week  for hospital discharge follow up, repeat cbc/bmp at follow up, PCP to repeat TSH in 4-6 week -F/u with neurology stroke clinic Dr Leonie Man in 6 weeks   [name] confirms the following:  -[facility] services initiated with RN on 11/01/16.  PT  First visit remains pending but to be provided by [facility] PT with [place].  -PCP post-hospital follow-up appt scheduled for 11/09/16.  -Neurology appt has not been scheduled.   RN CM confirmed daughter has Dr. Clydene Fake office number.  Advised to contact office to schedule 6 week appt.   Daughter states providing patient with Miralax to manage some constipation while in-patient.  Last bowel movement was 11/01/15.  Daughter denies any additional needs and states she has [facility] services and feels this is enough.   Plan:   [facility] Stroke Program:  11/02/16 - 11/02/16  RN CM advised in [facility] Stroke Program automation calls and if any alerts taht RN CM will follow-up.  Daughter agreed to continue with [facility]  Automated Program.    Mariann Laster, [place], BSN, RN, [place] Management Care Management Coordinator [phone] Direct [phone] Cell [phone] Office [phone] Fax [email or web address]

## 2016-11-04 DIAGNOSIS — I10 Essential (primary) hypertension: Secondary | ICD-10-CM | POA: Diagnosis not present

## 2016-11-04 DIAGNOSIS — I69393 Ataxia following cerebral infarction: Secondary | ICD-10-CM | POA: Diagnosis not present

## 2016-11-04 DIAGNOSIS — E43 Unspecified severe protein-calorie malnutrition: Secondary | ICD-10-CM | POA: Diagnosis not present

## 2016-11-04 DIAGNOSIS — Z7902 Long term (current) use of antithrombotics/antiplatelets: Secondary | ICD-10-CM | POA: Diagnosis not present

## 2016-11-04 DIAGNOSIS — Z8673 Personal history of transient ischemic attack (TIA), and cerebral infarction without residual deficits: Secondary | ICD-10-CM | POA: Diagnosis not present

## 2016-11-04 DIAGNOSIS — J449 Chronic obstructive pulmonary disease, unspecified: Secondary | ICD-10-CM | POA: Diagnosis not present

## 2016-11-04 DIAGNOSIS — I69828 Other speech and language deficits following other cerebrovascular disease: Secondary | ICD-10-CM | POA: Diagnosis not present

## 2016-11-04 DIAGNOSIS — D519 Vitamin B12 deficiency anemia, unspecified: Secondary | ICD-10-CM | POA: Diagnosis not present

## 2016-11-04 DIAGNOSIS — I712 Thoracic aortic aneurysm, without rupture: Secondary | ICD-10-CM | POA: Diagnosis not present

## 2016-11-04 NOTE — Telephone Encounter (Signed)
Ria Comment with [facility] notified, per Dr. Lorelei Pont,  to do a mobile chest x-ray.

## 2016-11-07 DIAGNOSIS — Z7902 Long term (current) use of antithrombotics/antiplatelets: Secondary | ICD-10-CM | POA: Diagnosis not present

## 2016-11-07 DIAGNOSIS — I69393 Ataxia following cerebral infarction: Secondary | ICD-10-CM | POA: Diagnosis not present

## 2016-11-07 DIAGNOSIS — D519 Vitamin B12 deficiency anemia, unspecified: Secondary | ICD-10-CM | POA: Diagnosis not present

## 2016-11-07 DIAGNOSIS — Z8673 Personal history of transient ischemic attack (TIA), and cerebral infarction without residual deficits: Secondary | ICD-10-CM | POA: Diagnosis not present

## 2016-11-07 DIAGNOSIS — I10 Essential (primary) hypertension: Secondary | ICD-10-CM | POA: Diagnosis not present

## 2016-11-07 DIAGNOSIS — J449 Chronic obstructive pulmonary disease, unspecified: Secondary | ICD-10-CM | POA: Diagnosis not present

## 2016-11-07 DIAGNOSIS — E43 Unspecified severe protein-calorie malnutrition: Secondary | ICD-10-CM | POA: Diagnosis not present

## 2016-11-07 DIAGNOSIS — I69828 Other speech and language deficits following other cerebrovascular disease: Secondary | ICD-10-CM | POA: Diagnosis not present

## 2016-11-07 DIAGNOSIS — I712 Thoracic aortic aneurysm, without rupture: Secondary | ICD-10-CM | POA: Diagnosis not present

## 2016-11-08 DIAGNOSIS — D519 Vitamin B12 deficiency anemia, unspecified: Secondary | ICD-10-CM | POA: Diagnosis not present

## 2016-11-08 DIAGNOSIS — I69828 Other speech and language deficits following other cerebrovascular disease: Secondary | ICD-10-CM | POA: Diagnosis not present

## 2016-11-08 DIAGNOSIS — I10 Essential (primary) hypertension: Secondary | ICD-10-CM | POA: Diagnosis not present

## 2016-11-08 DIAGNOSIS — E43 Unspecified severe protein-calorie malnutrition: Secondary | ICD-10-CM | POA: Diagnosis not present

## 2016-11-08 DIAGNOSIS — I712 Thoracic aortic aneurysm, without rupture: Secondary | ICD-10-CM | POA: Diagnosis not present

## 2016-11-08 DIAGNOSIS — Z8673 Personal history of transient ischemic attack (TIA), and cerebral infarction without residual deficits: Secondary | ICD-10-CM | POA: Diagnosis not present

## 2016-11-08 DIAGNOSIS — Z7902 Long term (current) use of antithrombotics/antiplatelets: Secondary | ICD-10-CM | POA: Diagnosis not present

## 2016-11-08 DIAGNOSIS — J449 Chronic obstructive pulmonary disease, unspecified: Secondary | ICD-10-CM | POA: Diagnosis not present

## 2016-11-08 DIAGNOSIS — I69393 Ataxia following cerebral infarction: Secondary | ICD-10-CM | POA: Diagnosis not present

## 2016-11-09 ENCOUNTER — Encounter: Payer: Self-pay | Admitting: Family Medicine

## 2016-11-09 ENCOUNTER — Ambulatory Visit (INDEPENDENT_AMBULATORY_CARE_PROVIDER_SITE_OTHER): Payer: Medicare HMO | Admitting: Family Medicine

## 2016-11-09 VITALS — BP 92/70 | HR 93 | Temp 97.5°F | Ht 61.0 in | Wt 98.2 lb

## 2016-11-09 DIAGNOSIS — I6389 Other cerebral infarction: Secondary | ICD-10-CM

## 2016-11-09 DIAGNOSIS — D519 Vitamin B12 deficiency anemia, unspecified: Secondary | ICD-10-CM | POA: Diagnosis not present

## 2016-11-09 DIAGNOSIS — Z79899 Other long term (current) drug therapy: Secondary | ICD-10-CM | POA: Diagnosis not present

## 2016-11-09 DIAGNOSIS — R300 Dysuria: Secondary | ICD-10-CM

## 2016-11-09 DIAGNOSIS — I69354 Hemiplegia and hemiparesis following cerebral infarction affecting left non-dominant side: Secondary | ICD-10-CM

## 2016-11-09 DIAGNOSIS — E43 Unspecified severe protein-calorie malnutrition: Secondary | ICD-10-CM | POA: Diagnosis not present

## 2016-11-09 DIAGNOSIS — D508 Other iron deficiency anemias: Secondary | ICD-10-CM | POA: Diagnosis not present

## 2016-11-09 LAB — CBC WITH DIFFERENTIAL/PLATELET
BASOS PCT: 0.6 % (ref 0.0–3.0)
Basophils Absolute: 0 10*3/uL (ref 0.0–0.1)
EOS ABS: 0.2 10*3/uL (ref 0.0–0.7)
EOS PCT: 3.1 % (ref 0.0–5.0)
HCT: 32.4 % — ABNORMAL LOW (ref 36.0–46.0)
HEMOGLOBIN: 10.7 g/dL — AB (ref 12.0–15.0)
LYMPHS ABS: 1 10*3/uL (ref 0.7–4.0)
Lymphocytes Relative: 14.8 % (ref 12.0–46.0)
MCHC: 32.9 g/dL (ref 30.0–36.0)
MCV: 96.6 fl (ref 78.0–100.0)
Monocytes Absolute: 0.4 10*3/uL (ref 0.1–1.0)
Monocytes Relative: 6.2 % (ref 3.0–12.0)
NEUTROS PCT: 75.3 % (ref 43.0–77.0)
Neutro Abs: 4.9 10*3/uL (ref 1.4–7.7)
Platelets: 430 10*3/uL — ABNORMAL HIGH (ref 150.0–400.0)
RBC: 3.36 Mil/uL — ABNORMAL LOW (ref 3.87–5.11)
RDW: 15.3 % (ref 11.5–15.5)
WBC: 6.5 10*3/uL (ref 4.0–10.5)

## 2016-11-09 LAB — POC URINALSYSI DIPSTICK (AUTOMATED)
Bilirubin, UA: NEGATIVE
GLUCOSE UA: NEGATIVE
KETONES UA: NEGATIVE
Leukocytes, UA: NEGATIVE
Nitrite, UA: NEGATIVE
PROTEIN UA: NEGATIVE
RBC UA: NEGATIVE
SPEC GRAV UA: 1.025
UROBILINOGEN UA: 0.2
pH, UA: 6

## 2016-11-09 LAB — BASIC METABOLIC PANEL
BUN: 14 mg/dL (ref 6–23)
CALCIUM: 8.7 mg/dL (ref 8.4–10.5)
CO2: 27 mEq/L (ref 19–32)
Chloride: 109 mEq/L (ref 96–112)
Creatinine, Ser: 0.69 mg/dL (ref 0.40–1.20)
GFR: 85.24 mL/min (ref >60.00)
Glucose, Bld: 87 mg/dL (ref 70–99)
POTASSIUM: 4 meq/L (ref 3.5–5.1)
SODIUM: 143 meq/L (ref 135–145)

## 2016-11-09 MED ORDER — ATORVASTATIN CALCIUM 10 MG PO TABS: 10.0000 mg | ORAL_TABLET | Freq: Every day | ORAL | 3 refills | Status: DC

## 2016-11-09 MED ORDER — CYANOCOBALAMIN 1000 MCG/ML IJ SOLN: INTRAMUSCULAR | 1 refills | Status: DC

## 2016-11-09 MED ORDER — LEVOTHYROXINE SODIUM 25 MCG PO TABS: 25.0000 ug | ORAL_TABLET | Freq: Every day | ORAL | 3 refills | Status: DC

## 2016-11-09 MED ORDER — CLOPIDOGREL BISULFATE 75 MG PO TABS: 75.0000 mg | ORAL_TABLET | Freq: Every day | ORAL | 3 refills | Status: DC

## 2016-11-09 NOTE — Progress Notes (Signed)
Pre visit review using our clinic review tool, if applicable. No additional management support is needed unless otherwise documented below in the visit note. 

## 2016-11-09 NOTE — Progress Notes (Signed)
Dr. Frederico Hamman T. Lonnie Rosado, MD, [facility] [place], [address] Phone: [phone] Fax: [phone]  11/09/2016  Patient: [name] [name], MRN: [id number], DOB: 08-22-1926, 83 y.o.  Primary Physician:  Owens Loffler, MD   Chief Complaint  Patient presents with  . Hospitalization Follow-up    CVA   Subjective:   [name] [name] is a 83 y.o. very pleasant female patient who presents with the following:  Transitional care office visit:  F/u stroke: the patient's daughter initially called me on October 26, 2016.  I spoke to her verbally in the office, and this is well documented.  She related a history that the patient began to lose functional use of her left side and became confused the prior day.  Ultimately after some debate, the family decided to take the patient to seek medical care and follow my advice.  She was found to have an acute stroke and was admitted. She is here with her daughter today, and she is actually remarkably improved and has started walking with her walker.  She is doing physical therapy and working on her rehabilitation every day.  Her left side  Has become quite a bit stronger, but still not back to baseline.  Imaging results are below.   Admit date: 10/26/2016 Discharge date: 10/28/2016  Recommendations for Outpatient Follow-up:  1. F/u with PMD within a week  for hospital discharge follow up, repeat cbc/bmp at follow up, pmd to repeat tsh in 4-6 weeks   Past Medical History, Surgical History, Social History, Family History, Problem List, Medications, and Allergies have been reviewed and updated if relevant.  Patient Active Problem List   Diagnosis Date Noted  . Elevated troponin 10/27/2016  . Protein-calorie malnutrition, severe ([place]) 10/27/2016  . CVA (cerebral vascular accident) ([place]) 10/26/2016  . Osteoarthritis of lumbar spine 10/07/2011  . Essential hypertension 01/12/2009  . Aneurysm  of thoracic aorta ([place]) 01/12/2009  . CARCINOMA, BASAL CELL, HX OF 11/07/2008  . RENAL CALCULUS, HX OF 11/07/2008  . ANEMIA, B12 DEFICIENCY 11/04/2008  . Iron deficiency anemia 11/04/2008  . TIA 06/01/2007  . CAROTID BRUIT, RIGHT 05/25/2007  . COPD 10/17/2002  . DIVERTICULAR DISEASE 10/17/1986    Past Medical History:  Diagnosis Date  . Aortic aneurysm ([place])   . B12 deficiency anemia    s/p Billroth 2  . Carcinoma, basal cell, skin   . COPD (chronic obstructive pulmonary disease) ([place])   . Diplopia   . Diverticular disease   . Hypertension    Unspecified  . PUD (peptic ulcer disease)   . Renal calculus   . Thoracic aortic aneurysm ([facility])    7 cm?  . TIA (transient ischemic attack) 2010    Past Surgical History:  Procedure Laterality Date  . BACK SURGERY  1986  . BASAL CELL CARCINOMA EXCISION    . Chest CT  10/21/2002   Ascending aneurysm; 5.5 X 5.5  . HERNIA REPAIR    . PARTIAL HYSTERECTOMY     for Fibroids  . STOMACH SURGERY  1968   X2 Billroth II; Vagotomysubtotal gastric/ gastrectomy (1989)  . TUBAL LIGATION      Social History   Social History  . Marital status: Married    Spouse name: N/A  . Number of children: N/A  . Years of education: N/A   Occupational History  . Retired    Social History Main Topics  . Smoking status: Never Smoker  .  Smokeless tobacco: Never Used  . Alcohol use No  . Drug use: No  . Sexual activity: Not on file   Other Topics Concern  . Not on file   Social History Narrative   Lives with husband.    4 children, 2 sons and 2 daughters: Irving Burton and Dorian Pod live close by.   Exercise: minimal due to arthritis   Diet: heart healthy   Living Will: Has one but not sure what is on it.                Family History  Problem Relation Age of Onset  . Cholecystitis Mother 14  . Stroke Father 19  . Cancer Maternal Aunt     facial CA  . Heart attack Neg Hx   . Other Neg Hx     CV/HBP  . Hypertension Neg Hx      No Known Allergies  Medication list reviewed and updated in full in [place].   GEN: No acute illnesses, no fevers, chills. GI: No n/v/d, eating normally Pulm: No SOB Interactive and getting along well at home.  Otherwise, ROS is as per the HPI.  Objective:   BP 92/70   Pulse 93   Temp 97.5 F (36.4 C) (Oral)   Ht 5\' 1"  (1.549 m)   Wt 98 lb 4 oz (44.6 kg)   BMI 18.56 kg/m   GEN: WDWN, NAD, Non-toxic, A & O x 3 HEENT: Atraumatic, Normocephalic. Neck supple. No masses, No LAD. Ears and Nose: No external deformity. CV: RRR, No M/G/R. No JVD. No thrill. No extra heart sounds. PULM: CTA B, no wheezes, crackles, rhonchi. No retractions. No resp. distress. No accessory muscle use. EXTR: No c/c/e NEURO Normal gait.  PSYCH: Normally interactive. Conversant. Not depressed or anxious appearing.  Calm demeanor.   Laboratory and Imaging Data: Dg Chest 2 View  Result Date: 10/26/2016 CLINICAL DATA:  LEFT side weakness, confusion EXAM: CHEST  2 VIEW COMPARISON:  01/15/2016 FINDINGS: Enlargement of cardiac silhouette. Mediastinal contours and pulmonary vascularity normal. Atherosclerotic calcification aorta. Emphysematous and minimal bronchitic changes question COPD. No acute infiltrate, pleural effusion, or pneumothorax. Marked osseous demineralization. IMPRESSION: Enlargement of cardiac silhouette. Question COPD changes without acute infiltrate. Aortic atherosclerosis. Electronically Signed   By: Lavonia Dana M.D.   On: 10/26/2016 17:39   Ct Head Wo Contrast  Result Date: 10/27/2016 CLINICAL DATA:  Lethargy. Pt unable to provide hx. EXAM: CT HEAD WITHOUT CONTRAST TECHNIQUE: Contiguous axial images were obtained from the base of the skull through the vertex without intravenous contrast. COMPARISON:  Head CT 10/26/2016 FINDINGS: Brain: No acute intracranial hemorrhage. No focal mass lesion. No CT evidence of acute infarction. No midline shift or mass effect. No hydrocephalus.  Basilar cisterns are patent. Subcortical infarction in the high RIGHT frontal lobe compares to recent MRI findings. Remote infarction in the RIGHT caudate nucleus/internal capsule. RIGHT cerebellar infarction also noted. There are periventricular and subcortical white matter hypodensities. Generalized cortical atrophy. Vascular: No hyperdense vessel or unexpected calcification. Skull: Normal. Negative for fracture or focal lesion. Sinuses/Orbits: Paranasal sinuses and mastoid air cells are clear. Orbits are clear. Other: None. IMPRESSION: 1. No acute intracranial findings. 2. RIGHT sided infarctions unchanged from comparison MRI. Electronically Signed   By: Suzy Bouchard M.D.   On: 10/27/2016 12:07   Ct Head Wo Contrast  Result Date: 10/26/2016 CLINICAL DATA:  Acute onset left-sided weakness yesterday. EXAM: CT HEAD WITHOUT CONTRAST TECHNIQUE: Contiguous axial images were  obtained from the base of the skull through the vertex without intravenous contrast. COMPARISON:  Brain MRI 05/26/2007 and CT 05/25/2007 FINDINGS: Brain: There is no evidence of acute cortical infarct, intracranial hemorrhage, mass, midline shift, or extra-axial fluid collection. There is a small inferior right parietal MCA territory cortical infarct which is new from the prior studies and chronic in appearance. Chronic lacunar infarcts are present in the basal ganglia bilaterally and have increased in the interim. Periventricular white matter hypodensities have also mildly progressed and are compatible with chronic small vessel ischemic disease. There is a new small to moderate-sized right cerebellar PICA territory infarct which appears chronic. A new right thalamic lacunar infarcts is also felt to be chronic. Cerebral atrophy has progressed, most notable in the perisylvian regions. A partially empty sella is unchanged. Vascular: Calcified atherosclerosis in the carotid siphons. No hyperdense vessel. Skull: No fracture or focal osseous  lesion. Sinuses/Orbits: Paranasal sinuses and mastoid air cells are clear. Prior bilateral cataract extraction. Other: None. IMPRESSION: 1. No evidence of acute intracranial abnormality. 2. Interval chronic infarcts in the right parietal lobe, right cerebellum, bilateral basal ganglia, and right thalamus. Electronically Signed   By: Logan Bores M.D.   On: 10/26/2016 18:11   Mr Brain Wo Contrast  Result Date: 10/26/2016 CLINICAL DATA:  83 y/o  F; left-sided weakness. EXAM: MRI HEAD WITHOUT CONTRAST TECHNIQUE: Multiplanar, multiecho pulse sequences of the brain and surrounding structures were obtained without intravenous contrast. COMPARISON:  10/26/2016 CT head.  05/26/2007 MRI of the head. FINDINGS: Brain: Small focus of diffusion restriction within the right lateral parietal lobe and additional small focus in the right posterior frontal area subcortical white matter compatible with acute/early subacute ischemia. Focus of encephalomalacia in the right inferior cerebellar hemisphere and right frontal parietal junction compatible chronic infarcts. Bilateral lentiform nuclei small chronic lacunar infarcts. Background of moderate chronic microvascular ischemic changes and parenchymal volume loss of the brain. Focus of susceptibility hypointensity within the right brachium pontis and punctate foci and basal ganglia are stable and compatible with hemosiderin deposition from old microhemorrhage. No new susceptibility hypointensity to indicate intracranial hemorrhage. No hydrocephalus, extra-axial collection, or focal mass effect. Incidental note of partially empty sella turcica. Vascular: Normal flow voids. Right posterior frontal developmental venous anomaly. Skull and upper cervical spine: Normal marrow signal. Sinuses/Orbits: Mild mucosal thickening of the ethmoid air cells. Otherwise no abnormal signal of paranasal sinuses or mastoid air cells. Bilateral intra-ocular lens replacement. Other: None. IMPRESSION: 1.  Small acute/early subacute infarct within the right lateral parietal lobe and additional punctate infarct in the right posterior frontal subcortical white matter. No associated hemorrhage. 2. Chronic infarcts in the right inferior cerebellar hemisphere and right frontal parietal junction as well as small chronic lacunar infarcts in the bilateral basal ganglia. 3. Moderate chronic microvascular ischemic changes and parenchymal volume loss of the brain. These results were called by telephone at the time of interpretation on 10/26/2016 at 10:14 pm to Dr. Blanchie Dessert , who verbally acknowledged these results. Electronically Signed   By: Kristine Garbe M.D.   On: 10/26/2016 22:17   Mr Jodene Nam Head/brain [id number] Cm  Result Date: 10/27/2016 CLINICAL DATA:  Confusion.  Left arm and leg weakness. EXAM: MRA HEAD WITHOUT CONTRAST TECHNIQUE: Angiographic images of the Circle of Willis were obtained using MRA technique without intravenous contrast. COMPARISON:  Brain MRI 10/26/2016 Head CT 10/26/2016 FINDINGS: Focal signal abnormality within the subarachnoid space overlying the right parietal lobe. Intracranial internal carotid arteries: Normal. Anterior cerebral arteries:  Normal. Middle cerebral arteries: Normal. Posterior communicating arteries: Present bilaterally. Posterior cerebral arteries: Bilateral fetal origins. Basilar artery: Diminutive basilar artery is likely a congenital variant given the presence of bilateral fetal origins of the posterior cerebral arteries. Vertebral arteries: Left dominant. Normal. Superior cerebellar arteries: Normal. Anterior inferior cerebellar arteries: Normal. Posterior inferior cerebellar arteries: Normal. IMPRESSION: 1. No occlusion or high-grade stenosis of the intracranial arteries. 2. Focal signal abnormality within one of the right parietal sulci. This is indeterminate on this time-of-flight examination, but CT is recommended to exclude the possibility of subarachnoid  hemorrhage at this location. These results will be called to the ordering clinician or representative by the Radiologist Assistant, and communication documented in the PACS or zVision Dashboard. Electronically Signed   By: Ulyses Jarred M.D.   On: 10/27/2016 05:07   Results for orders placed or performed in visit on 11/09/16  CBC with Differential/Platelet  Result Value Ref Range   WBC 6.5 4.0 - 10.5 K/uL   RBC 3.36 (L) 3.87 - 5.11 Mil/uL   Hemoglobin 10.7 (L) 12.0 - 15.0 g/dL   HCT 32.4 (L) 36.0 - 46.0 %   MCV 96.6 78.0 - 100.0 fl   MCHC 32.9 30.0 - 36.0 g/dL   RDW 15.3 11.5 - 15.5 %   Platelets 430.0 (H) 150.0 - 400.0 K/uL   Neutrophils Relative % 75.3 43.0 - 77.0 %   Lymphocytes Relative 14.8 12.0 - 46.0 %   Monocytes Relative 6.2 3.0 - 12.0 %   Eosinophils Relative 3.1 0.0 - 5.0 %   Basophils Relative 0.6 0.0 - 3.0 %   Neutro Abs 4.9 1.4 - 7.7 K/uL   Lymphs Abs 1.0 0.7 - 4.0 K/uL   Monocytes Absolute 0.4 0.1 - 1.0 K/uL   Eosinophils Absolute 0.2 0.0 - 0.7 K/uL   Basophils Absolute 0.0 0.0 - 0.1 K/uL  Basic metabolic panel  Result Value Ref Range   Sodium 143 135 - 145 mEq/L   Potassium 4.0 3.5 - 5.1 mEq/L   Chloride 109 96 - 112 mEq/L   CO2 27 19 - 32 mEq/L   Glucose, Bld 87 70 - 99 mg/dL   BUN 14 6 - 23 mg/dL   Creatinine, Ser 0.69 0.40 - 1.20 mg/dL   Calcium 8.7 8.4 - 10.5 mg/dL   GFR 85.24 >60.00 mL/min  POCT Urinalysis Dipstick (Automated)  Result Value Ref Range   Color, UA yellow    Clarity, UA clear    Glucose, UA negative    Bilirubin, UA negative    Ketones, UA negative    Spec Grav, UA 1.025    Blood, UA negative    pH, UA 6.0    Protein, UA negative    Urobilinogen, UA 0.2    Nitrite, UA negative    Leukocytes, UA Negative Negative     Assessment and Plan:   Cerebrovascular accident (CVA) due to other mechanism ([place])  Dysuria - Plan: POCT Urinalysis Dipstick (Automated)  Encounter for long-term current use of medication - Plan: CBC with  Differential/Platelet, Basic metabolic panel  Protein-calorie malnutrition, severe ([facility])  Other iron deficiency anemia  Anemia due to vitamin B12 deficiency, unspecified B12 deficiency type  She is actually doing remarkably well having just had a stroke.  Her strength is recovering well, and at 35 she is also seemingly very compliant with her physical therapy.  Reviewed that she will need to be on lifelong Plavix or other antiplatelet medication.  We'll need to keep her on  Lipids.  Repeat thyroid testing in 6 weeks. Check other laboratories today.  I encouraged her to eat.   She has excellent family support.  Follow-up: 6 months with me or sooner if needed  Meds ordered this encounter  Medications  . clopidogrel (PLAVIX) 75 MG tablet    Sig: Take 1 tablet (75 mg total) by mouth daily.    Dispense:  90 tablet    Refill:  3  . atorvastatin (LIPITOR) 10 MG tablet    Sig: Take 1 tablet (10 mg total) by mouth daily.    Dispense:  90 tablet    Refill:  3  . levothyroxine (SYNTHROID, LEVOTHROID) 25 MCG tablet    Sig: Take 1 tablet (25 mcg total) by mouth daily before breakfast.    Dispense:  90 tablet    Refill:  3  . cyanocobalamin (,VITAMIN B-12,) 1000 MCG/ML injection    Sig: INJECT 1 ML INTERMUSCLUARLLY ONCE A MONTH.    Dispense:  12 mL    Refill:  1   Medications Discontinued During This Encounter  Medication Reason  . feeding supplement (BOOST / RESOURCE BREEZE) LIQD Prescription never filled  . clopidogrel (PLAVIX) 75 MG tablet Reorder  . atorvastatin (LIPITOR) 10 MG tablet Reorder  . levothyroxine (SYNTHROID, LEVOTHROID) 25 MCG tablet Reorder  . cyanocobalamin (,VITAMIN B-12,) 1000 MCG/ML injection Reorder   Orders Placed This Encounter  Procedures  . CBC with Differential/Platelet  . Basic metabolic panel  . POCT Urinalysis Dipstick (Automated)    Signed,  Angad Nabers T. Mead Slane, MD   Allergies as of 11/09/2016   No Known Allergies     Medication List        Accurate as of 11/09/16 11:59 PM. Always use your most recent med list.          ANACIN 400-32 MG Tabs Generic drug:  Aspirin-Caffeine Take 2 tablets by mouth 2 (two) times daily.   atorvastatin 10 MG tablet Commonly known as:  LIPITOR Take 1 tablet (10 mg total) by mouth daily.   clopidogrel 75 MG tablet Commonly known as:  PLAVIX Take 1 tablet (75 mg total) by mouth daily.   cyanocobalamin 1000 MCG/ML injection Commonly known as:  (VITAMIN B-12) INJECT 1 ML INTERMUSCLUARLLY ONCE A MONTH.   levothyroxine 25 MCG tablet Commonly known as:  SYNTHROID, LEVOTHROID Take 1 tablet (25 mcg total) by mouth daily before breakfast.   metoprolol tartrate 25 MG tablet Commonly known as:  LOPRESSOR Take 0.5 tablets (12.5 mg total) by mouth 2 (two) times daily.   multivitamin with minerals Tabs tablet Take 1 tablet by mouth daily.   nystatin cream Commonly known as:  MYCOSTATIN APPLY 1 APPLICATION TOPICALLY 2 (TWO) TIMES DAILY   oxybutynin 5 MG tablet Commonly known as:  DITROPAN TAKE 1 TABLET TWICE DAILY   PRESERVISION AREDS 2 Caps Take 1 capsule by mouth 2 (two) times daily.

## 2016-11-10 ENCOUNTER — Encounter: Payer: Self-pay | Admitting: *Deleted

## 2016-11-10 DIAGNOSIS — I69393 Ataxia following cerebral infarction: Secondary | ICD-10-CM | POA: Diagnosis not present

## 2016-11-10 DIAGNOSIS — E43 Unspecified severe protein-calorie malnutrition: Secondary | ICD-10-CM | POA: Diagnosis not present

## 2016-11-10 DIAGNOSIS — D519 Vitamin B12 deficiency anemia, unspecified: Secondary | ICD-10-CM | POA: Diagnosis not present

## 2016-11-10 DIAGNOSIS — I69828 Other speech and language deficits following other cerebrovascular disease: Secondary | ICD-10-CM | POA: Diagnosis not present

## 2016-11-10 DIAGNOSIS — J449 Chronic obstructive pulmonary disease, unspecified: Secondary | ICD-10-CM | POA: Diagnosis not present

## 2016-11-10 DIAGNOSIS — I10 Essential (primary) hypertension: Secondary | ICD-10-CM | POA: Diagnosis not present

## 2016-11-10 DIAGNOSIS — Z8673 Personal history of transient ischemic attack (TIA), and cerebral infarction without residual deficits: Secondary | ICD-10-CM | POA: Diagnosis not present

## 2016-11-10 DIAGNOSIS — I712 Thoracic aortic aneurysm, without rupture: Secondary | ICD-10-CM | POA: Diagnosis not present

## 2016-11-10 DIAGNOSIS — Z7902 Long term (current) use of antithrombotics/antiplatelets: Secondary | ICD-10-CM | POA: Diagnosis not present

## 2016-11-11 DIAGNOSIS — Z8673 Personal history of transient ischemic attack (TIA), and cerebral infarction without residual deficits: Secondary | ICD-10-CM | POA: Diagnosis not present

## 2016-11-11 DIAGNOSIS — I10 Essential (primary) hypertension: Secondary | ICD-10-CM | POA: Diagnosis not present

## 2016-11-11 DIAGNOSIS — I69393 Ataxia following cerebral infarction: Secondary | ICD-10-CM | POA: Diagnosis not present

## 2016-11-11 DIAGNOSIS — Z7902 Long term (current) use of antithrombotics/antiplatelets: Secondary | ICD-10-CM | POA: Diagnosis not present

## 2016-11-11 DIAGNOSIS — D519 Vitamin B12 deficiency anemia, unspecified: Secondary | ICD-10-CM | POA: Diagnosis not present

## 2016-11-11 DIAGNOSIS — E43 Unspecified severe protein-calorie malnutrition: Secondary | ICD-10-CM | POA: Diagnosis not present

## 2016-11-11 DIAGNOSIS — I69828 Other speech and language deficits following other cerebrovascular disease: Secondary | ICD-10-CM | POA: Diagnosis not present

## 2016-11-11 DIAGNOSIS — J449 Chronic obstructive pulmonary disease, unspecified: Secondary | ICD-10-CM | POA: Diagnosis not present

## 2016-11-11 DIAGNOSIS — I712 Thoracic aortic aneurysm, without rupture: Secondary | ICD-10-CM | POA: Diagnosis not present

## 2016-11-15 DIAGNOSIS — D519 Vitamin B12 deficiency anemia, unspecified: Secondary | ICD-10-CM | POA: Diagnosis not present

## 2016-11-15 DIAGNOSIS — I712 Thoracic aortic aneurysm, without rupture: Secondary | ICD-10-CM | POA: Diagnosis not present

## 2016-11-15 DIAGNOSIS — J449 Chronic obstructive pulmonary disease, unspecified: Secondary | ICD-10-CM | POA: Diagnosis not present

## 2016-11-15 DIAGNOSIS — I10 Essential (primary) hypertension: Secondary | ICD-10-CM | POA: Diagnosis not present

## 2016-11-15 DIAGNOSIS — Z8673 Personal history of transient ischemic attack (TIA), and cerebral infarction without residual deficits: Secondary | ICD-10-CM | POA: Diagnosis not present

## 2016-11-15 DIAGNOSIS — I69828 Other speech and language deficits following other cerebrovascular disease: Secondary | ICD-10-CM | POA: Diagnosis not present

## 2016-11-15 DIAGNOSIS — Z7902 Long term (current) use of antithrombotics/antiplatelets: Secondary | ICD-10-CM | POA: Diagnosis not present

## 2016-11-15 DIAGNOSIS — E43 Unspecified severe protein-calorie malnutrition: Secondary | ICD-10-CM | POA: Diagnosis not present

## 2016-11-15 DIAGNOSIS — I69393 Ataxia following cerebral infarction: Secondary | ICD-10-CM | POA: Diagnosis not present

## 2016-11-16 ENCOUNTER — Other Ambulatory Visit: Payer: Self-pay

## 2016-11-16 MED ORDER — METOPROLOL TARTRATE 25 MG PO TABS: 12.5000 mg | ORAL_TABLET | Freq: Two times a day (BID) | ORAL | 3 refills | Status: DC

## 2016-11-16 NOTE — Telephone Encounter (Signed)
[name] [name] said pt was seen on 11/09/16 and metoprolol was supposed to be refilled at that visit; refilled per protocol. (Dr Erlinda Hong hospitalist and metoprolol on med list). [name] [name] voiced understanding.

## 2016-11-17 DIAGNOSIS — Z8673 Personal history of transient ischemic attack (TIA), and cerebral infarction without residual deficits: Secondary | ICD-10-CM | POA: Diagnosis not present

## 2016-11-17 DIAGNOSIS — E43 Unspecified severe protein-calorie malnutrition: Secondary | ICD-10-CM | POA: Diagnosis not present

## 2016-11-17 DIAGNOSIS — I69393 Ataxia following cerebral infarction: Secondary | ICD-10-CM | POA: Diagnosis not present

## 2016-11-17 DIAGNOSIS — I712 Thoracic aortic aneurysm, without rupture: Secondary | ICD-10-CM | POA: Diagnosis not present

## 2016-11-17 DIAGNOSIS — I10 Essential (primary) hypertension: Secondary | ICD-10-CM | POA: Diagnosis not present

## 2016-11-17 DIAGNOSIS — Z7902 Long term (current) use of antithrombotics/antiplatelets: Secondary | ICD-10-CM | POA: Diagnosis not present

## 2016-11-17 DIAGNOSIS — D519 Vitamin B12 deficiency anemia, unspecified: Secondary | ICD-10-CM | POA: Diagnosis not present

## 2016-11-17 DIAGNOSIS — J449 Chronic obstructive pulmonary disease, unspecified: Secondary | ICD-10-CM | POA: Diagnosis not present

## 2016-11-17 DIAGNOSIS — I69828 Other speech and language deficits following other cerebrovascular disease: Secondary | ICD-10-CM | POA: Diagnosis not present

## 2016-11-18 DIAGNOSIS — I69828 Other speech and language deficits following other cerebrovascular disease: Secondary | ICD-10-CM | POA: Diagnosis not present

## 2016-11-18 DIAGNOSIS — I69393 Ataxia following cerebral infarction: Secondary | ICD-10-CM | POA: Diagnosis not present

## 2016-11-18 DIAGNOSIS — J449 Chronic obstructive pulmonary disease, unspecified: Secondary | ICD-10-CM | POA: Diagnosis not present

## 2016-11-18 DIAGNOSIS — I10 Essential (primary) hypertension: Secondary | ICD-10-CM | POA: Diagnosis not present

## 2016-11-18 DIAGNOSIS — D519 Vitamin B12 deficiency anemia, unspecified: Secondary | ICD-10-CM | POA: Diagnosis not present

## 2016-11-18 DIAGNOSIS — I712 Thoracic aortic aneurysm, without rupture: Secondary | ICD-10-CM | POA: Diagnosis not present

## 2016-11-18 DIAGNOSIS — Z7902 Long term (current) use of antithrombotics/antiplatelets: Secondary | ICD-10-CM | POA: Diagnosis not present

## 2016-11-18 DIAGNOSIS — E43 Unspecified severe protein-calorie malnutrition: Secondary | ICD-10-CM | POA: Diagnosis not present

## 2016-11-18 DIAGNOSIS — Z8673 Personal history of transient ischemic attack (TIA), and cerebral infarction without residual deficits: Secondary | ICD-10-CM | POA: Diagnosis not present

## 2016-11-22 ENCOUNTER — Other Ambulatory Visit (INDEPENDENT_AMBULATORY_CARE_PROVIDER_SITE_OTHER): Payer: Medicare HMO

## 2016-11-22 ENCOUNTER — Telehealth: Payer: Self-pay | Admitting: Family Medicine

## 2016-11-22 DIAGNOSIS — R300 Dysuria: Secondary | ICD-10-CM

## 2016-11-22 LAB — POC URINALSYSI DIPSTICK (AUTOMATED)
BILIRUBIN UA: NEGATIVE
GLUCOSE UA: NEGATIVE
KETONES UA: NEGATIVE
Nitrite, UA: POSITIVE
Urobilinogen, UA: 0.2
pH, UA: 6

## 2016-11-22 MED ORDER — CIPROFLOXACIN HCL 250 MG PO TABS: 250.0000 mg | ORAL_TABLET | Freq: Two times a day (BID) | ORAL | 0 refills | Status: DC

## 2016-11-22 NOTE — Telephone Encounter (Signed)
Spoke with [name].  She states Ms. Picco is having urinary frequency, burning and pressure.  She denies any fever or confusion.  Pleases advise.

## 2016-11-22 NOTE — Telephone Encounter (Signed)
I recommend for pts to come in to be seen if she is unable to come in , Dr. Lorelei Pont can decide if he wants to have her drop off a sample. Please document symtpoms.

## 2016-11-22 NOTE — Progress Notes (Signed)
Typical symptoms of UTI.  UA positive.  Send for urine culture. Sent in rx for  cipro BIX x 3 days to [facility].   No renal issues 13 days ago.

## 2016-11-22 NOTE — Telephone Encounter (Signed)
Left message for [name] [name] that urinalysis does look suspicious for UTI. Dr. Diona Browner sent in [place] to [facility].

## 2016-11-22 NOTE — Telephone Encounter (Signed)
[name] [name] notified to bring urine by the office.

## 2016-11-22 NOTE — Telephone Encounter (Signed)
Let her know ideally we want patients seen for safety, if she cannot physically come in.. daughter can bring in a sample of urine ASAP.  Check  UA,  Spin for micro and  send for culture.

## 2016-11-22 NOTE — Telephone Encounter (Signed)
Patient Name: [name] [name]  DOB: Jun 19, 1926    Initial Comment Caller states, her mother is having UTI symptoms. Needing to bring the sample down. Has stroke 4 weeks ago. Was told she needed to be triage. Verified - needing to keep the sample warm or cold.    Nurse Assessment  Nurse: Leilani Merl, RN, Heather Date/Time (Eastern Time): 11/22/2016 9:10:14 AM  Confirm and document reason for call. If symptomatic, describe symptoms. ---Caller states, her mother is having UTI symptoms. Needing to bring the sample down. Has stroke 4 weeks ago. Was told she needed to be triage. Verified - needing to keep the sample warm or cold.  Does the patient have any new or worsening symptoms? ---Yes  Will a triage be completed? ---Yes  Related visit to physician within the last 2 weeks? ---No  Does the PT have any chronic conditions? (i.e. diabetes, asthma, etc.) ---Yes  List chronic conditions. ---See MR  Is this a behavioral health or substance abuse call? ---No     Guidelines    Guideline Title Affirmed Question Affirmed Notes  Urination Pain - Female Age > 44 years    Final Disposition User   See Physician within [place], RN, Conservator, museum/gallery states that she does not want her mother to have to go in to the office, caller would just like to take in a urine specimen to be tested. Please call her back and let her know what she needs to do.   Referrals  GO TO FACILITY REFUSED   Disagree/Comply: Disagree  Disagree/Comply Reason: Disagree with instructions

## 2016-11-24 DIAGNOSIS — J449 Chronic obstructive pulmonary disease, unspecified: Secondary | ICD-10-CM | POA: Diagnosis not present

## 2016-11-24 DIAGNOSIS — D519 Vitamin B12 deficiency anemia, unspecified: Secondary | ICD-10-CM | POA: Diagnosis not present

## 2016-11-24 DIAGNOSIS — I69393 Ataxia following cerebral infarction: Secondary | ICD-10-CM | POA: Diagnosis not present

## 2016-11-24 DIAGNOSIS — Z8673 Personal history of transient ischemic attack (TIA), and cerebral infarction without residual deficits: Secondary | ICD-10-CM | POA: Diagnosis not present

## 2016-11-24 DIAGNOSIS — I712 Thoracic aortic aneurysm, without rupture: Secondary | ICD-10-CM | POA: Diagnosis not present

## 2016-11-24 DIAGNOSIS — I69828 Other speech and language deficits following other cerebrovascular disease: Secondary | ICD-10-CM | POA: Diagnosis not present

## 2016-11-24 DIAGNOSIS — Z7902 Long term (current) use of antithrombotics/antiplatelets: Secondary | ICD-10-CM | POA: Diagnosis not present

## 2016-11-24 DIAGNOSIS — E43 Unspecified severe protein-calorie malnutrition: Secondary | ICD-10-CM | POA: Diagnosis not present

## 2016-11-24 DIAGNOSIS — I10 Essential (primary) hypertension: Secondary | ICD-10-CM | POA: Diagnosis not present

## 2016-11-25 ENCOUNTER — Telehealth: Payer: Self-pay | Admitting: Family Medicine

## 2016-11-25 LAB — URINE CULTURE

## 2016-11-25 MED ORDER — CIPROFLOXACIN HCL 250 MG PO TABS: 250.0000 mg | ORAL_TABLET | Freq: Two times a day (BID) | ORAL | 0 refills | Status: DC

## 2016-11-25 NOTE — Telephone Encounter (Signed)
Spoke to daughter, Eustaquio Maize.

## 2016-11-25 NOTE — Telephone Encounter (Signed)
-----   Message from Carter Kitten, [place] sent at 11/25/2016  2:31 PM EST ----- [name] notified as instructed by telephone.  She states Ms. Porras took her last Cipro today because she was only prescribed a 3 day course.  She states Ms. Tocci is still having some symptoms although better that she was.  She is asking if we need to extend her treatment a few more day.  Please Advise.

## 2016-11-25 NOTE — Telephone Encounter (Signed)
Yes.. I will send in longer course of cipro for another 4 days to [facility].

## 2016-12-15 ENCOUNTER — Ambulatory Visit: Payer: Self-pay | Admitting: Neurology

## 2017-03-30 ENCOUNTER — Other Ambulatory Visit: Payer: Self-pay | Admitting: Family Medicine

## 2017-04-22 ENCOUNTER — Encounter (HOSPITAL_COMMUNITY): Payer: Self-pay | Admitting: *Deleted

## 2017-04-22 ENCOUNTER — Inpatient Hospital Stay (HOSPITAL_COMMUNITY)
Admission: EM | Admit: 2017-04-22 | Discharge: 2017-04-28 | DRG: 871 | Disposition: A | Discharge status: Home Health | Payer: Medicare HMO | Attending: Family Medicine | Admitting: Family Medicine

## 2017-04-22 ENCOUNTER — Emergency Department (HOSPITAL_COMMUNITY): Payer: Medicare HMO

## 2017-04-22 DIAGNOSIS — R7989 Other specified abnormal findings of blood chemistry: Secondary | ICD-10-CM | POA: Diagnosis not present

## 2017-04-22 DIAGNOSIS — Z7982 Long term (current) use of aspirin: Secondary | ICD-10-CM | POA: Diagnosis not present

## 2017-04-22 DIAGNOSIS — I5022 Chronic systolic (congestive) heart failure: Secondary | ICD-10-CM | POA: Diagnosis present

## 2017-04-22 DIAGNOSIS — Z85828 Personal history of other malignant neoplasm of skin: Secondary | ICD-10-CM | POA: Diagnosis not present

## 2017-04-22 DIAGNOSIS — G4700 Insomnia, unspecified: Secondary | ICD-10-CM | POA: Diagnosis not present

## 2017-04-22 DIAGNOSIS — I110 Hypertensive heart disease with heart failure: Secondary | ICD-10-CM | POA: Diagnosis present

## 2017-04-22 DIAGNOSIS — E039 Hypothyroidism, unspecified: Secondary | ICD-10-CM | POA: Diagnosis present

## 2017-04-22 DIAGNOSIS — R509 Fever, unspecified: Secondary | ICD-10-CM | POA: Diagnosis not present

## 2017-04-22 DIAGNOSIS — Z808 Family history of malignant neoplasm of other organs or systems: Secondary | ICD-10-CM

## 2017-04-22 DIAGNOSIS — Z9181 History of falling: Secondary | ICD-10-CM

## 2017-04-22 DIAGNOSIS — Z66 Do not resuscitate: Secondary | ICD-10-CM | POA: Diagnosis present

## 2017-04-22 DIAGNOSIS — I712 Thoracic aortic aneurysm, without rupture, unspecified: Secondary | ICD-10-CM | POA: Diagnosis present

## 2017-04-22 DIAGNOSIS — D509 Iron deficiency anemia, unspecified: Secondary | ICD-10-CM | POA: Diagnosis present

## 2017-04-22 DIAGNOSIS — I350 Nonrheumatic aortic (valve) stenosis: Secondary | ICD-10-CM | POA: Diagnosis present

## 2017-04-22 DIAGNOSIS — R748 Abnormal levels of other serum enzymes: Secondary | ICD-10-CM | POA: Diagnosis not present

## 2017-04-22 DIAGNOSIS — I429 Cardiomyopathy, unspecified: Secondary | ICD-10-CM | POA: Diagnosis not present

## 2017-04-22 DIAGNOSIS — R410 Disorientation, unspecified: Secondary | ICD-10-CM | POA: Diagnosis present

## 2017-04-22 DIAGNOSIS — I248 Other forms of acute ischemic heart disease: Secondary | ICD-10-CM | POA: Diagnosis present

## 2017-04-22 DIAGNOSIS — A419 Sepsis, unspecified organism: Secondary | ICD-10-CM | POA: Diagnosis present

## 2017-04-22 DIAGNOSIS — I4891 Unspecified atrial fibrillation: Secondary | ICD-10-CM | POA: Diagnosis not present

## 2017-04-22 DIAGNOSIS — R6521 Severe sepsis with septic shock: Secondary | ICD-10-CM | POA: Diagnosis not present

## 2017-04-22 DIAGNOSIS — I471 Supraventricular tachycardia: Secondary | ICD-10-CM | POA: Diagnosis not present

## 2017-04-22 DIAGNOSIS — Z8673 Personal history of transient ischemic attack (TIA), and cerebral infarction without residual deficits: Secondary | ICD-10-CM | POA: Diagnosis not present

## 2017-04-22 DIAGNOSIS — J449 Chronic obstructive pulmonary disease, unspecified: Secondary | ICD-10-CM | POA: Diagnosis present

## 2017-04-22 DIAGNOSIS — R6520 Severe sepsis without septic shock: Secondary | ICD-10-CM | POA: Diagnosis not present

## 2017-04-22 DIAGNOSIS — E860 Dehydration: Secondary | ICD-10-CM | POA: Diagnosis present

## 2017-04-22 DIAGNOSIS — E872 Acidosis: Secondary | ICD-10-CM | POA: Diagnosis present

## 2017-04-22 DIAGNOSIS — Z6821 Body mass index (BMI) 21.0-21.9, adult: Secondary | ICD-10-CM | POA: Diagnosis not present

## 2017-04-22 DIAGNOSIS — I6789 Other cerebrovascular disease: Secondary | ICD-10-CM | POA: Diagnosis present

## 2017-04-22 DIAGNOSIS — E43 Unspecified severe protein-calorie malnutrition: Secondary | ICD-10-CM | POA: Diagnosis not present

## 2017-04-22 DIAGNOSIS — I359 Nonrheumatic aortic valve disorder, unspecified: Secondary | ICD-10-CM | POA: Diagnosis not present

## 2017-04-22 DIAGNOSIS — Z823 Family history of stroke: Secondary | ICD-10-CM

## 2017-04-22 DIAGNOSIS — N390 Urinary tract infection, site not specified: Secondary | ICD-10-CM | POA: Diagnosis present

## 2017-04-22 DIAGNOSIS — R000 Tachycardia, unspecified: Secondary | ICD-10-CM | POA: Diagnosis not present

## 2017-04-22 DIAGNOSIS — A4152 Sepsis due to Pseudomonas: Secondary | ICD-10-CM | POA: Diagnosis not present

## 2017-04-22 DIAGNOSIS — N179 Acute kidney failure, unspecified: Secondary | ICD-10-CM | POA: Diagnosis present

## 2017-04-22 DIAGNOSIS — R531 Weakness: Secondary | ICD-10-CM | POA: Diagnosis not present

## 2017-04-22 DIAGNOSIS — Z8711 Personal history of peptic ulcer disease: Secondary | ICD-10-CM

## 2017-04-22 DIAGNOSIS — Z87442 Personal history of urinary calculi: Secondary | ICD-10-CM

## 2017-04-22 DIAGNOSIS — Z7902 Long term (current) use of antithrombotics/antiplatelets: Secondary | ICD-10-CM

## 2017-04-22 DIAGNOSIS — Z90711 Acquired absence of uterus with remaining cervical stump: Secondary | ICD-10-CM

## 2017-04-22 DIAGNOSIS — R778 Other specified abnormalities of plasma proteins: Secondary | ICD-10-CM | POA: Diagnosis present

## 2017-04-22 DIAGNOSIS — Z9851 Tubal ligation status: Secondary | ICD-10-CM

## 2017-04-22 DIAGNOSIS — M5412 Radiculopathy, cervical region: Secondary | ICD-10-CM | POA: Diagnosis present

## 2017-04-22 DIAGNOSIS — R404 Transient alteration of awareness: Secondary | ICD-10-CM | POA: Diagnosis not present

## 2017-04-22 DIAGNOSIS — I480 Paroxysmal atrial fibrillation: Secondary | ICD-10-CM | POA: Diagnosis present

## 2017-04-22 HISTORY — DX: Other malaise: R53.81

## 2017-04-22 HISTORY — DX: Transient cerebral ischemic attack, unspecified: G45.9

## 2017-04-22 HISTORY — DX: Unspecified protein-calorie malnutrition: E46

## 2017-04-22 HISTORY — DX: Chronic systolic (congestive) heart failure: I50.22

## 2017-04-22 HISTORY — DX: Nonrheumatic aortic (valve) stenosis: I35.0

## 2017-04-22 HISTORY — DX: Cerebral infarction, unspecified: I63.9

## 2017-04-22 LAB — I-STAT TROPONIN, ED: TROPONIN I, POC: 0.04 ng/mL (ref 0.00–0.08)

## 2017-04-22 LAB — I-STAT VENOUS BLOOD GAS, ED
ACID-BASE DEFICIT: 9 mmol/L — AB (ref 0.0–2.0)
Bicarbonate: 17.6 mmol/L — ABNORMAL LOW (ref 20.0–28.0)
O2 SAT: 79 %
PCO2 VEN: 39.2 mmHg — AB (ref 44.0–60.0)
TCO2: 19 mmol/L (ref 0–100)
pH, Ven: 7.261 (ref 7.250–7.430)
pO2, Ven: 49 mmHg — ABNORMAL HIGH (ref 32.0–45.0)

## 2017-04-22 LAB — URINALYSIS, ROUTINE W REFLEX MICROSCOPIC
BILIRUBIN URINE: NEGATIVE
Bilirubin Urine: NEGATIVE
GLUCOSE, UA: NEGATIVE mg/dL
Glucose, UA: NEGATIVE mg/dL
KETONES UR: NEGATIVE mg/dL
Ketones, ur: NEGATIVE mg/dL
NITRITE: POSITIVE — AB
Nitrite: NEGATIVE
PH: 5 (ref 5.0–8.0)
PH: 5 (ref 5.0–8.0)
PROTEIN: 30 mg/dL — AB
Protein, ur: NEGATIVE mg/dL
SPECIFIC GRAVITY, URINE: 1.014 (ref 1.005–1.030)
Specific Gravity, Urine: 1.012 (ref 1.005–1.030)
Squamous Epithelial / LPF: NONE SEEN

## 2017-04-22 LAB — TYPE AND SCREEN
ABO/RH(D): A POS
ANTIBODY SCREEN: NEGATIVE

## 2017-04-22 LAB — GLUCOSE, CAPILLARY
GLUCOSE-CAPILLARY: 81 mg/dL (ref 65–99)
GLUCOSE-CAPILLARY: 85 mg/dL (ref 65–99)
Glucose-Capillary: 84 mg/dL (ref 65–99)

## 2017-04-22 LAB — COMPREHENSIVE METABOLIC PANEL
ALK PHOS: 85 U/L (ref 38–126)
ALT: 9 U/L — ABNORMAL LOW (ref 14–54)
ANION GAP: 12 (ref 5–15)
AST: 22 U/L (ref 15–41)
Albumin: 2.3 g/dL — ABNORMAL LOW (ref 3.5–5.0)
BILIRUBIN TOTAL: 0.6 mg/dL (ref 0.3–1.2)
BUN: 31 mg/dL — ABNORMAL HIGH (ref 6–20)
CALCIUM: 7.8 mg/dL — AB (ref 8.9–10.3)
CO2: 17 mmol/L — ABNORMAL LOW (ref 22–32)
CREATININE: 1.37 mg/dL — AB (ref 0.44–1.00)
Chloride: 110 mmol/L (ref 101–111)
GFR calc non Af Amer: 33 mL/min — ABNORMAL LOW (ref >60)
GFR, EST AFRICAN AMERICAN: 39 mL/min — AB (ref >60)
GLUCOSE: 81 mg/dL (ref 65–99)
Potassium: 3.8 mmol/L (ref 3.5–5.1)
Sodium: 139 mmol/L (ref 135–145)
TOTAL PROTEIN: 5.2 g/dL — AB (ref 6.5–8.1)

## 2017-04-22 LAB — CBC WITH DIFFERENTIAL/PLATELET
Basophils Absolute: 0 10*3/uL (ref 0.0–0.1)
Basophils Relative: 0 %
EOS ABS: 0 10*3/uL (ref 0.0–0.7)
EOS PCT: 0 %
HCT: 33.3 % — ABNORMAL LOW (ref 36.0–46.0)
Hemoglobin: 10.6 g/dL — ABNORMAL LOW (ref 12.0–15.0)
LYMPHS ABS: 0.4 10*3/uL — AB (ref 0.7–4.0)
Lymphocytes Relative: 2 %
MCH: 30.6 pg (ref 26.0–34.0)
MCHC: 31.8 g/dL (ref 30.0–36.0)
MCV: 96.2 fL (ref 78.0–100.0)
MONO ABS: 1.2 10*3/uL — AB (ref 0.1–1.0)
Monocytes Relative: 6 %
Neutro Abs: 18.5 10*3/uL — ABNORMAL HIGH (ref 1.7–7.7)
Neutrophils Relative %: 92 %
Platelets: 181 10*3/uL (ref 150–400)
RBC: 3.46 MIL/uL — AB (ref 3.87–5.11)
RDW: 15.2 % (ref 11.5–15.5)
WBC: 20.2 10*3/uL — AB (ref 4.0–10.5)

## 2017-04-22 LAB — ABO/RH: ABO/RH(D): A POS

## 2017-04-22 LAB — APTT: APTT: 39 s — AB (ref 24–36)

## 2017-04-22 LAB — I-STAT CG4 LACTIC ACID, ED: Lactic Acid, Venous: 2.06 mmol/L (ref 0.5–1.9)

## 2017-04-22 LAB — PROCALCITONIN
PROCALCITONIN: 15.54 ng/mL
Procalcitonin: 8.97 ng/mL

## 2017-04-22 LAB — LACTIC ACID, PLASMA
LACTIC ACID, VENOUS: 1.4 mmol/L (ref 0.5–1.9)
LACTIC ACID, VENOUS: 1.2 mmol/L (ref 0.5–1.9)
Lactic Acid, Venous: 2.2 mmol/L (ref 0.5–1.9)

## 2017-04-22 LAB — CORTISOL: CORTISOL PLASMA: 42.1 ug/dL

## 2017-04-22 LAB — PROTIME-INR
INR: 1.24
INR: 1.31
PROTHROMBIN TIME: 15.7 s — AB (ref 11.4–15.2)
Prothrombin Time: 16.3 seconds — ABNORMAL HIGH (ref 11.4–15.2)

## 2017-04-22 LAB — MRSA PCR SCREENING: MRSA by PCR: NEGATIVE

## 2017-04-22 LAB — TROPONIN I: TROPONIN I: 0.25 ng/mL — AB (ref <0.03)

## 2017-04-22 MED ORDER — SODIUM CHLORIDE 0.9 % IV BOLUS (SEPSIS)
500.0000 mL | Freq: Once | INTRAVENOUS | Status: AC
  Administered 2017-04-22: 500 mL via INTRAVENOUS

## 2017-04-22 MED ORDER — VANCOMYCIN HCL 500 MG IV SOLR: 500.0000 mg | INTRAVENOUS | Status: DC

## 2017-04-22 MED ORDER — DEXTROSE 5 % IV SOLN: 2.0000 g | Freq: Once | INTRAVENOUS | Status: DC

## 2017-04-22 MED ORDER — ADULT MULTIVITAMIN W/MINERALS CH
1.0000 | ORAL_TABLET | Freq: Every day | ORAL | Status: DC
  Administered 2017-04-22 – 2017-04-28 (×7): 1 via ORAL
  Filled 2017-04-22 (×7): qty 1

## 2017-04-22 MED ORDER — FAMOTIDINE IN NACL 20-0.9 MG/50ML-% IV SOLN
20.0000 mg | INTRAVENOUS | Status: DC
  Administered 2017-04-22 – 2017-04-23 (×2): 20 mg via INTRAVENOUS
  Filled 2017-04-22 (×3): qty 50

## 2017-04-22 MED ORDER — ONDANSETRON HCL 4 MG/2ML IJ SOLN: 4.0000 mg | Freq: Four times a day (QID) | INTRAMUSCULAR | Status: DC | PRN

## 2017-04-22 MED ORDER — ASPIRIN EC 325 MG PO TBEC
325.0000 mg | DELAYED_RELEASE_TABLET | Freq: Every day | ORAL | Status: DC | PRN
  Administered 2017-04-22 – 2017-04-23 (×2): 325 mg via ORAL
  Filled 2017-04-22 (×3): qty 1

## 2017-04-22 MED ORDER — CLOPIDOGREL BISULFATE 75 MG PO TABS
75.0000 mg | ORAL_TABLET | Freq: Every day | ORAL | Status: DC
  Administered 2017-04-22 – 2017-04-24 (×3): 75 mg via ORAL
  Filled 2017-04-22 (×4): qty 1

## 2017-04-22 MED ORDER — SODIUM CHLORIDE 0.9 % IV SOLN
250.0000 mL | INTRAVENOUS | Status: DC | PRN
  Administered 2017-04-25: 250 mL via INTRAVENOUS

## 2017-04-22 MED ORDER — FAMOTIDINE IN NACL 20-0.9 MG/50ML-% IV SOLN: 20.0000 mg | Freq: Two times a day (BID) | INTRAVENOUS | Status: DC

## 2017-04-22 MED ORDER — OXYBUTYNIN CHLORIDE 5 MG PO TABS
5.0000 mg | ORAL_TABLET | Freq: Two times a day (BID) | ORAL | Status: DC
  Administered 2017-04-22 – 2017-04-28 (×12): 5 mg via ORAL
  Filled 2017-04-22 (×14): qty 1

## 2017-04-22 MED ORDER — VANCOMYCIN HCL IN DEXTROSE 1-5 GM/200ML-% IV SOLN
1000.0000 mg | Freq: Once | INTRAVENOUS | Status: AC
  Administered 2017-04-22: 1000 mg via INTRAVENOUS
  Filled 2017-04-22: qty 200

## 2017-04-22 MED ORDER — SODIUM CHLORIDE 0.9 % IV BOLUS (SEPSIS)
1000.0000 mL | Freq: Once | INTRAVENOUS | Status: AC
  Administered 2017-04-22: 1000 mL via INTRAVENOUS

## 2017-04-22 MED ORDER — DEXTROSE 5 % IV SOLN
1.0000 g | INTRAVENOUS | Status: DC
  Administered 2017-04-22: 1 g via INTRAVENOUS
  Filled 2017-04-22: qty 10

## 2017-04-22 MED ORDER — PIPERACILLIN-TAZOBACTAM 3.375 G IVPB 30 MIN
3.3750 g | Freq: Once | INTRAVENOUS | Status: AC
  Administered 2017-04-22: 3.375 g via INTRAVENOUS
  Filled 2017-04-22: qty 50

## 2017-04-22 MED ORDER — ASPIRIN 81 MG PO CHEW
81.0000 mg | CHEWABLE_TABLET | Freq: Every day | ORAL | Status: DC
  Administered 2017-04-22 – 2017-04-25 (×3): 81 mg via ORAL
  Filled 2017-04-22 (×3): qty 1

## 2017-04-22 MED ORDER — HEPARIN SODIUM (PORCINE) 5000 UNIT/ML IJ SOLN
5000.0000 [IU] | Freq: Three times a day (TID) | INTRAMUSCULAR | Status: DC
  Administered 2017-04-22 – 2017-04-24 (×6): 5000 [IU] via SUBCUTANEOUS
  Filled 2017-04-22 (×7): qty 1

## 2017-04-22 MED ORDER — LEVOTHYROXINE SODIUM 25 MCG PO TABS
25.0000 ug | ORAL_TABLET | Freq: Every day | ORAL | Status: DC
  Administered 2017-04-23 – 2017-04-27 (×5): 25 ug via ORAL
  Filled 2017-04-22 (×7): qty 1

## 2017-04-22 MED ORDER — PIPERACILLIN-TAZOBACTAM 3.375 G IVPB: 3.3750 g | Freq: Three times a day (TID) | INTRAVENOUS | Status: DC

## 2017-04-22 MED ORDER — ASPIRIN-CAFFEINE 400-32 MG PO TABS: 2.0000 | ORAL_TABLET | Freq: Every day | ORAL | Status: DC | PRN

## 2017-04-22 MED ORDER — ACETAMINOPHEN 325 MG PO TABS
650.0000 mg | ORAL_TABLET | ORAL | Status: DC | PRN
  Administered 2017-04-23 – 2017-04-26 (×6): 650 mg via ORAL
  Filled 2017-04-22 (×6): qty 2

## 2017-04-22 MED ORDER — LACTATED RINGERS IV SOLN
INTRAVENOUS | Status: DC
  Administered 2017-04-22 (×2): via INTRAVENOUS
  Administered 2017-04-23: 1000 mL via INTRAVENOUS
  Administered 2017-04-24: 06:00:00 via INTRAVENOUS

## 2017-04-22 MED ORDER — LACTATED RINGERS IV BOLUS (SEPSIS)
1000.0000 mL | Freq: Once | INTRAVENOUS | Status: AC
  Administered 2017-04-22: 1000 mL via INTRAVENOUS

## 2017-04-22 MED ORDER — INSULIN ASPART 100 UNIT/ML ~~LOC~~ SOLN: 1.0000 [IU] | SUBCUTANEOUS | Status: DC

## 2017-04-22 NOTE — ED Provider Notes (Signed)
[facility] DEPT Provider Note   CSN: [id number] Arrival date & time: 04/22/17  0730     History   Chief Complaint Chief Complaint  Patient presents with  . Hypotension  . Fever    HPI [name] [name] is a 83 y.o. female.  HPI Patient presents to the emergency room with fever, weakness and confusion. History is obtained from the patient as well as her daughter. Patient lives at home. Her daughter provides 24-hour care.  Yesterday she was in her usual state of health however she started having urinary frequency. At the time, she denied any pain. She was not having any fevers however she was having to go to the bathroom frequently. Her daughter thought the urine did look dark. The patient's daughter started giving her Bactrim empirically for possible urinary tract infection. She has not had any vomiting. She's not had any diarrhea. Last night she started feeling feverish. This morning the patient was too weak to stand and use the bedside commode. This is not normal for her. Family members called EMS.  The patient herself denies any. She denies any chest pain or shortness of breath. No abdominal pain. However, intermittently her speech is garbled and hard to understand her at times. Past Medical History:  Diagnosis Date  . Aortic aneurysm ([place])   . B12 deficiency anemia    s/p Billroth 2  . Carcinoma, basal cell, skin   . COPD (chronic obstructive pulmonary disease) ([place])   . Diplopia   . Diverticular disease   . Hypertension    Unspecified  . PUD (peptic ulcer disease)   . Renal calculus   . Thoracic aortic aneurysm ([facility])    7 cm?  . TIA (transient ischemic attack) 2010    Patient Active Problem List   Diagnosis Date Noted  . B12 deficiency anemia 11/10/2016  . Protein-calorie malnutrition, severe ([place]) 10/27/2016  . CVA (cerebral vascular accident) ([place]) 10/26/2016  . Osteoarthritis of lumbar spine 10/07/2011  . Essential hypertension 01/12/2009  . Aneurysm of thoracic  aorta ([place]) 01/12/2009  . CARCINOMA, BASAL CELL, HX OF 11/07/2008  . RENAL CALCULUS, HX OF 11/07/2008  . Iron deficiency anemia 11/04/2008  . TIA 06/01/2007  . COPD 10/17/2002  . DIVERTICULAR DISEASE 10/17/1986    Past Surgical History:  Procedure Laterality Date  . BACK SURGERY  1986  . BASAL CELL CARCINOMA EXCISION    . Chest CT  10/21/2002   Ascending aneurysm; 5.5 X 5.5  . HERNIA REPAIR    . PARTIAL HYSTERECTOMY     for Fibroids  . STOMACH SURGERY  1968   X2 Billroth II; Vagotomysubtotal gastric/ gastrectomy (1989)  . TUBAL LIGATION      OB History    No data available       Home Medications    Prior to Admission medications   Medication Sig Start Date End Date Taking? Authorizing Provider  Aspirin-Caffeine (ANACIN) 400-32 MG TABS Take 2 tablets by mouth daily as needed (eadache).    Yes [provider]  atorvastatin (LIPITOR) 10 MG tablet Take 1 tablet (10 mg total) by mouth daily. 11/09/16  Yes Copland, Frederico Hamman, MD  clopidogrel (PLAVIX) 75 MG tablet Take 1 tablet (75 mg total) by mouth daily. 11/09/16  Yes Copland, Frederico Hamman, MD  cyanocobalamin (,VITAMIN B-12,) 1000 MCG/ML injection INJECT 1 ML INTERMUSCLUARLLY ONCE A MONTH. Patient taking differently: Inject 1,000 mcg into the muscle every 30 (thirty) days.  11/09/16  Yes Copland, Frederico Hamman, MD  levothyroxine (SYNTHROID, LEVOTHROID) 25  MCG tablet Take 1 tablet (25 mcg total) by mouth daily before breakfast. 11/09/16  Yes Copland, Frederico Hamman, MD  metoprolol tartrate (LOPRESSOR) 25 MG tablet Take 0.5 tablets (12.5 mg total) by mouth 2 (two) times daily. 11/16/16  Yes Copland, Frederico Hamman, MD  Multiple Vitamin (MULTIVITAMIN WITH MINERALS) TABS tablet Take 1 tablet by mouth daily.   Yes [provider]  Multiple Vitamins-Minerals (PRESERVISION AREDS 2) CAPS Take 1 capsule by mouth 2 (two) times daily.   Yes [provider]  oxybutynin (DITROPAN) 5 MG tablet TAKE 1 TABLET TWICE DAILY 03/30/17  Yes Copland,  Frederico Hamman, MD  nystatin cream (MYCOSTATIN) APPLY 1 APPLICATION TOPICALLY 2 (TWO) TIMES DAILY Patient taking differently: Apply 1 application topically 2 (two) times daily as needed.  05/12/16   Owens Loffler, MD    Family History Family History  Problem Relation Age of Onset  . Cholecystitis Mother 55  . Stroke Father 72  . Cancer Maternal Aunt        facial CA  . Heart attack Neg Hx   . Other Neg Hx        CV/HBP  . Hypertension Neg Hx     Social History Social History  Substance Use Topics  . Smoking status: Never Smoker  . Smokeless tobacco: Never Used  . Alcohol use No     Allergies   Patient has no known allergies.   Review of Systems Review of Systems  Constitutional: Positive for fever.  HENT: Negative for nosebleeds.   Respiratory: Negative for chest tightness and shortness of breath.   Cardiovascular: Negative for chest pain and leg swelling.  Gastrointestinal: Negative for abdominal pain.  Genitourinary: Positive for urgency.  Neurological: Positive for weakness.  Psychiatric/Behavioral: Positive for confusion.  All other systems reviewed and are negative.    Physical Exam Updated Vital Signs BP (!) 73/55   Pulse (!) 132   Temp 99.2 F (37.3 C) (Rectal)   Resp (!) 21   Ht 1.524 m (5')   Wt 44.5 kg (98 lb)   SpO2 91%   BMI 19.14 kg/m   Physical Exam  Constitutional: No distress.  Frail, thin, elderly  HENT:  Head: Normocephalic and atraumatic.  Right Ear: External ear normal.  Left Ear: External ear normal.  Mucous membranes are dry  Eyes: Conjunctivae are normal. Right eye exhibits no discharge. Left eye exhibits no discharge. No scleral icterus.  Neck: Neck supple. No tracheal deviation present.  Cardiovascular: Intact distal pulses.  An irregularly irregular rhythm present. Tachycardia present.   Pulmonary/Chest: Effort normal and breath sounds normal. No stridor. No respiratory distress. She has no wheezes. She has no rales.    Abdominal: Soft. Bowel sounds are normal. She exhibits no distension. There is no tenderness. There is no rebound and no guarding.  Musculoskeletal: She exhibits no edema or tenderness.  Neurological: She is alert. She is disoriented. No cranial nerve deficit (no facial droop, extraocular movements intact, no aphasia, intermittently garbled) or sensory deficit. She exhibits normal muscle tone. She displays no seizure activity. GCS eye subscore is 4. GCS verbal subscore is 4. GCS motor subscore is 6.  Generalized weakness,  patient will not grip my fingers with either hand, she will wiggle her toes slightly,  Skin: Skin is warm and dry. No rash noted. She is not diaphoretic.  Psychiatric: She has a normal mood and affect.  Nursing note and vitals reviewed.    ED Treatments / Results  Labs (all labs ordered are listed, but  only abnormal results are displayed) Labs Reviewed  COMPREHENSIVE METABOLIC PANEL - Abnormal; Notable for the following:       Result Value   CO2 17 (*)    BUN 31 (*)    Creatinine, Ser 1.37 (*)    Calcium 7.8 (*)    Total Protein 5.2 (*)    Albumin 2.3 (*)    ALT 9 (*)    GFR calc non Af Amer 33 (*)    GFR calc Af Amer 39 (*)    All other components within normal limits  CBC WITH DIFFERENTIAL/PLATELET - Abnormal; Notable for the following:    WBC 20.2 (*)    RBC 3.46 (*)    Hemoglobin 10.6 (*)    HCT 33.3 (*)    Neutro Abs 18.5 (*)    Lymphs Abs 0.4 (*)    Monocytes Absolute 1.2 (*)    All other components within normal limits  PROTIME-INR - Abnormal; Notable for the following:    Prothrombin Time 15.7 (*)    All other components within normal limits  URINALYSIS, ROUTINE W REFLEX MICROSCOPIC - Abnormal; Notable for the following:    Color, Urine AMBER (*)    Hgb urine dipstick SMALL (*)    Protein, ur 30 (*)    Nitrite POSITIVE (*)    Leukocytes, UA SMALL (*)    Bacteria, UA FEW (*)    All other components within normal limits  I-STAT CG4 LACTIC ACID,  ED - Abnormal; Notable for the following:    Lactic Acid, Venous 2.06 (*)    All other components within normal limits  I-STAT VENOUS BLOOD GAS, ED - Abnormal; Notable for the following:    pCO2, Ven 39.2 (*)    pO2, Ven 49.0 (*)    Bicarbonate 17.6 (*)    Acid-base deficit 9.0 (*)    All other components within normal limits  CULTURE, BLOOD (ROUTINE X 2)  CULTURE, BLOOD (ROUTINE X 2)  URINE CULTURE  LACTIC ACID, PLASMA  LACTIC ACID, PLASMA  PROCALCITONIN  I-STAT CG4 LACTIC ACID, ED  I-STAT TROPOININ, ED  TYPE AND SCREEN  ABO/RH    EKG  EKG Interpretation  Date/Time:  Saturday April 22 2017 07:50:53 EDT Ventricular Rate:  142 PR Interval:    QRS Duration: 82 QT Interval:  280 QTC Calculation: 431 R Axis:   70 Text Interpretation:  Supraventricular tachycardia  with PAC vs atrial fibrillation Multiple premature complexes, vent & supraven Probable anterior infarct, age indeterminate Confirmed by Dorie Rank [phone]) on 04/22/2017 8:05:11 AM Also confirmed by Dorie Rank [phone]), editor Drema Pry [phone])  on 04/22/2017 9:20:21 AM       Radiology Dg Chest Portable 1 View  Result Date: 04/22/2017 CLINICAL DATA:  Patient with fever and tachycardia. EXAM: PORTABLE CHEST 1 VIEW COMPARISON:  Chest radiograph 10/26/2016. FINDINGS: Patient is rotated to the left. Monitoring leads overlie the patient. Stable enlarged cardiac and mediastinal contours. Aortic vascular calcifications. Minimal heterogeneous opacities left lung base. No pleural effusion or pneumothorax. IMPRESSION: Minimal heterogeneous opacities left lung base favored to represent atelectasis. Electronically Signed   By: Lovey Newcomer M.D.   On: 04/22/2017 08:11    Procedures Procedures (including critical care time)  Medications Ordered in ED Medications  lactated ringers bolus 1,000 mL (not administered)  sodium chloride 0.9 % bolus 1,000 mL (0 mLs Intravenous Stopped 04/22/17 0828)    And  sodium chloride 0.9 %  bolus 500 mL (0 mLs Intravenous Stopped 04/22/17 0850)  piperacillin-tazobactam (ZOSYN) IVPB 3.375 g (0 g Intravenous Stopped 04/22/17 0848)  vancomycin (VANCOCIN) IVPB 1000 mg/200 mL premix (0 mg Intravenous Stopped 04/22/17 0918)     Initial Impression / Assessment and Plan / ED Course  I have reviewed the triage vital signs and the nursing notes.  Pertinent labs & imaging results that were available during my care of the patient were reviewed by me and considered in my medical decision making (see chart for details).  Clinical Course as of Apr 22 948  Sat Apr 22, 2017  0759 Discussed code status with family.  Pt is DNR.  Will continue with suspected sepsis treatment  [JK]  0830 Pt remains hypotensive.  Will continue with IV fluid hydration.  Will continue to monitor closely. No acidosis on VBG.  [JK]  0945 Hx of thoracic aortic aneurysm.  Pt is on BP meds and normally has BP in the low 90s.  Much lower today but will try additional fluids now before starting pressors.    On reassessment, mental status is improved. Still appears volume down.  Will give an additional liter  [JK]  0947 Spoke with critical care about admission.  [JK]    Clinical Course User Index [JK] Dorie Rank, MD   Pt presented to the fever with recent uti sx.  No with hypotension and confusion.  Workup is consistent with sepsis.  No significant improvement with fluids yet.  Will continue to monitor closely.  May end up needing pressors although with her thoracic aneurysm history we would need to be careful.  Final Clinical Impressions(s) / ED Diagnoses   Final diagnoses:  Sepsis, due to unspecified organism [facility])    New Prescriptions New Prescriptions   No medications on file     Dorie Rank, MD 04/22/17 [phone]

## 2017-04-22 NOTE — Progress Notes (Signed)
CRITICAL VALUE ALERT  Critical Value: Troponin 0.25  Date & Time Notied:  04/22/2017 1524  Provider Notified: Dr. Oletta Darter    Orders Received/Actions taken: Orders given

## 2017-04-22 NOTE — Progress Notes (Signed)
eLink Physician-Brief Progress Note Patient Name: [name] [name] DOB: December 24, 1925 MRN: [id number]   Date of Service  04/22/2017  HPI/Events of Note  Troponin = 0.04 --> 0.25. Demand ischemia d/t earlier SVT. HR now = 103.   eICU Interventions  Will order: 1. ASA 81 mg PO now and Q day. 2. Continue to trend Troponin.      Intervention Category Intermediate Interventions: Diagnostic test evaluation  Jeffrie Lofstrom [place] 04/22/2017, 3:23 PM

## 2017-04-22 NOTE — Progress Notes (Signed)
Pharmacy Antibiotic Note  [name] [name] is a 83 y.o. female admitted on 04/22/2017 with sepsis.  Pharmacy has been consulted for vancomycin and zosyn dosing.  Patient presents with complaints of fever, weakness and dark urine. She was given some left over septra by family then brought to ED for evaluation.   WBC noted to be elevated at 20 on admission with PCT of 8.97. Scr slightly elevated at 1.37, this is double from her baseline though. Cultures have been obtained.  Plan: Vancomycin 500mg  IV every 48 hours.  Goal trough 15-20 mcg/mL. Will continue Zosyn 3.375g IV q8h (4 hour infusion), if scr does not respond to hydration overnight will decrease dose.  Height: 5' (152.4 cm) Weight: 98 lb (44.5 kg) IBW/kg (Calculated) : 45.5  No data recorded.  No results for input(s): WBC, CREATININE, LATICACIDVEN, VANCOTROUGH, VANCOPEAK, VANCORANDOM, GENTTROUGH, GENTPEAK, GENTRANDOM, TOBRATROUGH, TOBRAPEAK, TOBRARND, AMIKACINPEAK, AMIKACINTROU, AMIKACIN in the last 168 hours.  CrCl cannot be calculated (Patient's most recent lab result is older than the maximum 21 days allowed.).    No Known Allergies  Antimicrobials this admission: Vanc 7/7>> Zosyn 7/7>>  Thank you for allowing pharmacy to be a part of this patient's care.  Erin Hearing PharmD., BCPS Clinical Pharmacist Pager [phone] 04/22/2017 11:25 AM

## 2017-04-22 NOTE — ED Triage Notes (Signed)
Pt in via [facility] from home, pt noticed to be febrile & weak yesterday with dark urine, pts family gave old Bactrim prescription, pt here today for further eval, pt takes Plavix, pt rcvd R arm skin tear in route via [facility] per report, gauze in place upon arrival to ED, pt hypotensive, tachypneic & tachycardic in a fib, pt lethargic, pt rcvd 1 L NS PTA

## 2017-04-22 NOTE — H&P (Signed)
PULMONARY / CRITICAL CARE MEDICINE   Name: [name] [name] MRN: [id number] DOB: [birth year]    ADMISSION DATE:  04/22/2017   CHIEF COMPLAINT:  Extreme weakness  HISTORY OF PRESENT ILLNESS:    83 year old female, DNR/DNI, presents with low-grade fever and weakness and some confusion. She has received 3 L of fluid in the ER and her confusion is now resolved. She is oriented 3. She is wide awake. She denies any chest pain nausea vomiting cough cold runny nose abdominal pain diarrhea or pedal edema.   She did have some urinary frequency yesterday and was started on Bactrim by her daughter at home. When she was brought to the ER today she was also found to be hypotensive. Her systolic was in the 50D. Patient normally runs low with a normal systolic being in the 32I as per patient's daughter at bedside.  Review of systems otherwise is negative  PAST MEDICAL HISTORY :  She  has a past medical history of Aortic aneurysm ([facility]); B12 deficiency anemia; Carcinoma, basal cell, skin; COPD (chronic obstructive pulmonary disease) ([place]); Diplopia; Diverticular disease; Hypertension; PUD (peptic ulcer disease); Renal calculus; Thoracic aortic aneurysm ([place]); and TIA (transient ischemic attack) (2010).  PAST SURGICAL HISTORY: She  has a past surgical history that includes Stomach surgery (1968); Back surgery (1986); Hernia repair; Tubal ligation; Partial hysterectomy; Chest CT (10/21/2002); and Excision basal cell carcinoma.  No Known Allergies  No current facility-administered medications on file prior to encounter.    Current Outpatient Prescriptions on File Prior to Encounter  Medication Sig  . Aspirin-Caffeine (ANACIN) 400-32 MG TABS Take 2 tablets by mouth daily as needed (eadache).   Marland Kitchen atorvastatin (LIPITOR) 10 MG tablet Take 1 tablet (10 mg total) by mouth daily.  . clopidogrel (PLAVIX) 75 MG tablet Take 1 tablet (75 mg total) by mouth daily.  . cyanocobalamin (,VITAMIN B-12,) 1000 MCG/ML  injection INJECT 1 ML INTERMUSCLUARLLY ONCE A MONTH. (Patient taking differently: Inject 1,000 mcg into the muscle every 30 (thirty) days. )  . levothyroxine (SYNTHROID, LEVOTHROID) 25 MCG tablet Take 1 tablet (25 mcg total) by mouth daily before breakfast.  . metoprolol tartrate (LOPRESSOR) 25 MG tablet Take 0.5 tablets (12.5 mg total) by mouth 2 (two) times daily.  . Multiple Vitamin (MULTIVITAMIN WITH MINERALS) TABS tablet Take 1 tablet by mouth daily.  . Multiple Vitamins-Minerals (PRESERVISION AREDS 2) CAPS Take 1 capsule by mouth 2 (two) times daily.  Marland Kitchen oxybutynin (DITROPAN) 5 MG tablet TAKE 1 TABLET TWICE DAILY  . nystatin cream (MYCOSTATIN) APPLY 1 APPLICATION TOPICALLY 2 (TWO) TIMES DAILY (Patient taking differently: Apply 1 application topically 2 (two) times daily as needed. )    FAMILY HISTORY:  Her indicated that her mother is deceased. She indicated that the status of her father is unknown. She indicated that her sister is alive. She indicated that the status of her maternal aunt is unknown. She indicated that the status of her neg hx is unknown.    SOCIAL HISTORY: She  reports that she has never smoked. She has never used smokeless tobacco. She reports that she does not drink alcohol or use drugs.  REVIEW OF SYSTEMS:   14 point review of system is done and is negative except as mentioned in history of present illness  VITAL SIGNS: BP (!) 77/52   Pulse (!) 102   Temp (!) 100.8 F (38.2 C) (Oral)   Resp (!) 22   Ht 5' (1.524 m)   Wt 44.5 kg (98 lb)  SpO2 98%   BMI 19.14 kg/m   INTAKE / OUTPUT: No intake/output data recorded.  PHYSICAL EXAMINATION: General:  Alert awake oriented 3 no apparent distress Neuro:  Nonfocal, follows commands HEENT:  Atraumatic normocephalic PERRLA EOMI Cardiovascular:  S1-S2 positive, Lungs:  Clear to auscultation bilaterally Abdomen:  Soft nontender nondistended bowel sounds present Musculoskeletal:  No cyanosis clubbing edema Skin:   No rashes  LABS:  BMET  Recent Labs Lab 04/22/17 0737  NA 139  K 3.8  CL 110  CO2 17*  BUN 31*  CREATININE 1.37*  GLUCOSE 81    Electrolytes  Recent Labs Lab 04/22/17 0737  CALCIUM 7.8*    CBC  Recent Labs Lab 04/22/17 0737  WBC 20.2*  HGB 10.6*  HCT 33.3*  PLT 181    Coag's  Recent Labs Lab 04/22/17 0737  INR 1.24    Sepsis Markers  Recent Labs Lab 04/22/17 0804 04/22/17 0950  LATICACIDVEN 2.06* 2.2*  PROCALCITON  --  8.97    ABG No results for input(s): PHART, PCO2ART, PO2ART in the last 168 hours.  Liver Enzymes  Recent Labs Lab 04/22/17 0737  AST 22  ALT 9*  ALKPHOS 85  BILITOT 0.6  ALBUMIN 2.3*    Cardiac Enzymes No results for input(s): TROPONINI, PROBNP in the last 168 hours.  Glucose No results for input(s): GLUCAP in the last 168 hours.  Imaging Dg Chest Portable 1 View  Result Date: 04/22/2017 CLINICAL DATA:  Patient with fever and tachycardia. EXAM: PORTABLE CHEST 1 VIEW COMPARISON:  Chest radiograph 10/26/2016. FINDINGS: Patient is rotated to the left. Monitoring leads overlie the patient. Stable enlarged cardiac and mediastinal contours. Aortic vascular calcifications. Minimal heterogeneous opacities left lung base. No pleural effusion or pneumothorax. IMPRESSION: Minimal heterogeneous opacities left lung base favored to represent atelectasis. Electronically Signed   By: Lovey Newcomer M.D.   On: 04/22/2017 08:11     STUDIES:  Urinalysis suggestive of UTI  CULTURES: Blood and urine cultures pending  ANTIBIOTICS: She received vancomycin and Zosyn in the ER. I will continue on Rocephin.  LINES/TUBES: Peripheral IV 2  DISCUSSION: 83 year old female DNR/DNI presenting with signs and symptoms of UTI and severe sepsis. At this moment although patient's blood pressure is borderline she is oriented 3 and not exhibiting any signs of shock. Admit to ICU and treated for urosepsis.  ASSESSMENT / PLAN:  PULMONARY 98%  on room air   CARDIOVASCULAR A:  Borderline hypotension P:  IV fluids. Pressors if map stays below 55  RENAL A:   AK I secondary to dehydration and sepsis P:   Continue with IV fluids. We will consider renal ultrasound if creatinine does not improve by tomorrow  GASTROINTESTINAL A:   GI prophylaxis   INFECTIOUS A:   UTI P:   Rocephin IV  DVT prophylaxis.  FAMILY  - Updates: Discussed with both daughters at bedside. Patient is DNR/DNI. Expect her to be ready to move out of ICU by tomorrow  STAFF NOTE: I, Sharia Reeve, MD have personally reviewed patient's available data, including medical history, events of note, physical examination and test results as part of my evaluation.   The patient is critically ill with multiple organ systems failure and requires high complexity decision making for assessment and support, frequent evaluation and titration of therapies, application of advanced monitoring technologies and extensive interpretation of multiple databases.   Critical Care Time devoted to patient care services described in this note is 37  Minutes. This time reflects time  of care of this signee: Sharia Reeve, MD. Pulmonary and [place] Pager: [phone]  04/22/2017, 12:44 PM

## 2017-04-22 NOTE — ED Notes (Signed)
Date and time results received: 04/22/17 11:23 AM   Test: Lactic Acid Critical Value: 2.2  Name of Provider Notified: MD Tomi Bamberger  Orders Received

## 2017-04-22 NOTE — Progress Notes (Signed)
Pharmacy Antibiotic Note  [name] [name] is a 83 y.o. female admitted on 04/22/2017 with UTI.  Pharmacy has been consulted for Ceftriaxone dosing. WBC 20.2. SCr 1.37/ est CrCl ~ 19 mL/min. Tmax 100.8. Currently in Afib and Hypotensive. Patient received 1 dose of Vancomycin and Zosyn in the ED.   Plan: Ceftriaxone 1g IV every 24 hours  Pharmacy will sign off as dose does not need to be adjusted.   Height: 5' (152.4 cm) Weight: 98 lb (44.5 kg) IBW/kg (Calculated) : 45.5  Temp (24hrs), Avg:100 F (37.8 C), Min:99.2 F (37.3 C), Max:100.8 F (38.2 C)   Recent Labs Lab 04/22/17 0737 04/22/17 0804 04/22/17 0950  WBC 20.2*  --   --   CREATININE 1.37*  --   --   LATICACIDVEN  --  2.06* 2.2*    Estimated Creatinine Clearance: 19.9 mL/min (A) (by C-G formula based on SCr of 1.37 mg/dL (H)).    No Known Allergies  Antimicrobials this admission: Vancomycin 7/7 x1 Zosyn 7/7 x1 Ceftriaxone 7/7 >>  Dose adjustments this admission:  Microbiology results: 7/7 Blood >> 7/7 Urine >>  Thank you for allowing pharmacy to be a part of this patient's care.  Sloan Leiter, PharmD, BCPS Clinical Pharmacist Clinical Phone 04/22/2017 until 3:30 PM - [phone] After hours, please call #[phone] 04/22/2017 12:30 PM

## 2017-04-23 ENCOUNTER — Encounter (HOSPITAL_COMMUNITY): Payer: Self-pay | Admitting: Physician Assistant

## 2017-04-23 DIAGNOSIS — I4891 Unspecified atrial fibrillation: Secondary | ICD-10-CM | POA: Diagnosis present

## 2017-04-23 DIAGNOSIS — N179 Acute kidney failure, unspecified: Secondary | ICD-10-CM | POA: Diagnosis present

## 2017-04-23 DIAGNOSIS — N390 Urinary tract infection, site not specified: Secondary | ICD-10-CM

## 2017-04-23 DIAGNOSIS — A419 Sepsis, unspecified organism: Secondary | ICD-10-CM

## 2017-04-23 DIAGNOSIS — R6521 Severe sepsis with septic shock: Secondary | ICD-10-CM

## 2017-04-23 DIAGNOSIS — I350 Nonrheumatic aortic (valve) stenosis: Secondary | ICD-10-CM

## 2017-04-23 LAB — CBC
HCT: 27.6 % — ABNORMAL LOW (ref 36.0–46.0)
Hemoglobin: 9 g/dL — ABNORMAL LOW (ref 12.0–15.0)
MCH: 30.9 pg (ref 26.0–34.0)
MCHC: 32.6 g/dL (ref 30.0–36.0)
MCV: 94.8 fL (ref 78.0–100.0)
PLATELETS: 156 10*3/uL (ref 150–400)
RBC: 2.91 MIL/uL — AB (ref 3.87–5.11)
RDW: 15.2 % (ref 11.5–15.5)
WBC: 17.3 10*3/uL — ABNORMAL HIGH (ref 4.0–10.5)

## 2017-04-23 LAB — BLOOD CULTURE ID PANEL (REFLEXED)
Acinetobacter baumannii: NOT DETECTED
CARBAPENEM RESISTANCE: NOT DETECTED
Candida albicans: NOT DETECTED
Candida glabrata: NOT DETECTED
Candida krusei: NOT DETECTED
Candida parapsilosis: NOT DETECTED
Candida tropicalis: NOT DETECTED
ENTEROBACTERIACEAE SPECIES: NOT DETECTED
ENTEROCOCCUS SPECIES: NOT DETECTED
ESCHERICHIA COLI: NOT DETECTED
Enterobacter cloacae complex: NOT DETECTED
Haemophilus influenzae: NOT DETECTED
Klebsiella oxytoca: NOT DETECTED
Klebsiella pneumoniae: NOT DETECTED
LISTERIA MONOCYTOGENES: NOT DETECTED
NEISSERIA MENINGITIDIS: NOT DETECTED
PSEUDOMONAS AERUGINOSA: DETECTED — AB
Proteus species: NOT DETECTED
SERRATIA MARCESCENS: NOT DETECTED
STAPHYLOCOCCUS AUREUS BCID: NOT DETECTED
STAPHYLOCOCCUS SPECIES: NOT DETECTED
STREPTOCOCCUS AGALACTIAE: NOT DETECTED
STREPTOCOCCUS PNEUMONIAE: NOT DETECTED
STREPTOCOCCUS PYOGENES: NOT DETECTED
Streptococcus species: NOT DETECTED

## 2017-04-23 LAB — GLUCOSE, CAPILLARY
GLUCOSE-CAPILLARY: 98 mg/dL (ref 65–99)
Glucose-Capillary: 116 mg/dL — ABNORMAL HIGH (ref 65–99)
Glucose-Capillary: 121 mg/dL — ABNORMAL HIGH (ref 65–99)
Glucose-Capillary: 124 mg/dL — ABNORMAL HIGH (ref 65–99)
Glucose-Capillary: 58 mg/dL — ABNORMAL LOW (ref 65–99)
Glucose-Capillary: 69 mg/dL (ref 65–99)
Glucose-Capillary: 74 mg/dL (ref 65–99)

## 2017-04-23 LAB — MAGNESIUM: Magnesium: 1.2 mg/dL — ABNORMAL LOW (ref 1.7–2.4)

## 2017-04-23 LAB — BASIC METABOLIC PANEL
Anion gap: 8 (ref 5–15)
BUN: 24 mg/dL — AB (ref 6–20)
CO2: 16 mmol/L — ABNORMAL LOW (ref 22–32)
CREATININE: 1.04 mg/dL — AB (ref 0.44–1.00)
Calcium: 7.3 mg/dL — ABNORMAL LOW (ref 8.9–10.3)
Chloride: 111 mmol/L (ref 101–111)
GFR calc Af Amer: 54 mL/min — ABNORMAL LOW (ref >60)
GFR, EST NON AFRICAN AMERICAN: 47 mL/min — AB (ref >60)
GLUCOSE: 77 mg/dL (ref 65–99)
POTASSIUM: 3.6 mmol/L (ref 3.5–5.1)
SODIUM: 135 mmol/L (ref 135–145)

## 2017-04-23 LAB — TROPONIN I: TROPONIN I: 0.39 ng/mL — AB (ref <0.03)

## 2017-04-23 LAB — URINE CULTURE: Culture: <10000 — AB

## 2017-04-23 LAB — PHOSPHORUS: PHOSPHORUS: 3.1 mg/dL (ref 2.5–4.6)

## 2017-04-23 MED ORDER — SODIUM CHLORIDE 0.9 % IV BOLUS (SEPSIS)
500.0000 mL | Freq: Once | INTRAVENOUS | Status: AC
  Administered 2017-04-23: 1000 mL via INTRAVENOUS

## 2017-04-23 MED ORDER — DEXTROSE 5 % IV SOLN
1.0000 g | INTRAVENOUS | Status: DC
  Administered 2017-04-23: 1 g via INTRAVENOUS
  Filled 2017-04-23 (×2): qty 1

## 2017-04-23 MED ORDER — NOREPINEPHRINE BITARTRATE 1 MG/ML IV SOLN
0.0000 ug/min | INTRAVENOUS | Status: DC
  Administered 2017-04-23: 2 ug/min via INTRAVENOUS
  Filled 2017-04-23: qty 4

## 2017-04-23 MED ORDER — LEVOFLOXACIN IN D5W 500 MG/100ML IV SOLN
500.0000 mg | INTRAVENOUS | Status: DC
  Filled 2017-04-23: qty 100

## 2017-04-23 MED ORDER — MAGNESIUM SULFATE 2 GM/50ML IV SOLN
2.0000 g | Freq: Once | INTRAVENOUS | Status: AC
  Administered 2017-04-23: 2 g via INTRAVENOUS
  Filled 2017-04-23: qty 50

## 2017-04-23 MED ORDER — PHENYLEPHRINE HCL 10 MG/ML IJ SOLN
0.0000 ug/min | INTRAMUSCULAR | Status: DC
  Administered 2017-04-23: 20 ug/min via INTRAVENOUS
  Filled 2017-04-23: qty 1

## 2017-04-23 NOTE — Progress Notes (Signed)
[facility] Progress Note Patient Name: [name] [name] DOB: 09/30/1926 MRN: [id number]   Date of Service  04/23/2017  HPI/Events of Note  AF-RVR , ? New onset Remains hypotensive  eICU Interventions  500 c bolus given Start neo gtt Hypomag -repleted      Intervention Category Major Interventions: Sepsis - evaluation and management Intermediate Interventions: Electrolyte abnormality - evaluation and management  ALVA,RAKESH V. 04/23/2017, 5:06 AM

## 2017-04-23 NOTE — Progress Notes (Signed)
Called [facility], Dr Elsworth Soho to report contd afib teens-130s and sbp 80 post ns bolus, mg level 1.2 and troponin level 0.39. Orders for neosynephrine drip and magnesium replacement given to this rn. Pt asleep, daughters updated at bedside.

## 2017-04-23 NOTE — Progress Notes (Signed)
Pharmacy Antibiotic Note  [name] [name] is a 83 y.o. female admitted on 04/22/2017 with weakness, found to have pseudomonas bacteremia. Pharmacy has been consulted for cefepime and levaquin dosing per CCM for double coverage. Tmax 100.8, WBC 17.3, and LA normal. SCr 1.04 for estimate CrCl ~ 25-30 mL/min.   Plan: D/C ceftriaxone Levaquin 500 mg IV q48 hr Cefepime 1g IV q24hr  Monitor renal function, clinical picture, and culture results F/u length of therapy and de-escalation   Height: 5' (152.4 cm) Weight: 100 lb (45.4 kg) IBW/kg (Calculated) : 45.5  Temp (24hrs), Avg:99.3 F (37.4 C), Min:98.1 F (36.7 C), Max:100.8 F (38.2 C)   Recent Labs Lab 04/22/17 0737 04/22/17 0804 04/22/17 0950 04/22/17 1411 04/22/17 1625 04/23/17 0154  WBC 20.2*  --   --   --   --  17.3*  CREATININE 1.37*  --   --   --   --  1.04*  LATICACIDVEN  --  2.06* 2.2* 1.4 1.2  --     Estimated Creatinine Clearance: 26.8 mL/min (A) (by C-G formula based on SCr of 1.04 mg/dL (H)).    No Known Allergies  Antimicrobials this admission: 7/7 Vanc x1 7/7 Zosyn x1 7/7 CTX > 7/8 7/8 Levaquin >>  7/8 Cefepime >>   Dose adjustments this admission: n/a  Microbiology results: 7/8 Blood Cx: GNR  7/8 BCID: pseudomonas  7/7 Urine cx: send 7/7 MRSA PCR - neg   Argie Ramming, PharmD Clinical Pharmacist 04/23/17 6:37 AM

## 2017-04-23 NOTE — Consult Note (Signed)
Cardiology Consultation Note  Patient ID: [name] [name], MRN: [id number], DOB/AGE: 1925-11-29 83 y.o. Admit date: 04/22/2017   Date of Consult: 04/23/2017 Primary Physician: Owens Loffler, MD Primary Cardiologist: New to [facility] - consult by [facility] Requesting Physician: Dr. Lake Bells, MD  Chief Complaint: Weakness Reason for Consult: Severe aortic stenosis/new onset Afib with RVR  HPI: [name] [name] is a 83 y.o. female who is being seen today for the evaluation of severe aortic stenosis and new onset Afib with RVR at the request of Dr. Lake Bells, MD. Patient has a h/o chronic systolic CHF, severe aortic stenosis by echo in 10/2016, thoracic aortic aneurysm measuring 5.5 x 5.5 cm by CT in 2004, TIA/stroke in 2010 and 10/2016, carotid artery disease, COPD, HTN, anemia, BCC, PUD, and diverticular disease who was admitted to [facility] on with septic shock in the setting of a UTI. Cardiology is asked to evaluate her severe aortic stenosis and new onset Afib with RVR.   Previously followed by Dr. Lia Foyer, last seen in 2013 for routine follow up of her thoracic aortic aneurysm. Further cardiology follow up was not needed. She was recently admitted to [facility] in 10/2016 for right lateral parietal lobe and right posterior frontal lobe subcortical white matter infarcts 2/2 small vessel disease. No evidence of Afib that admission. Echo that admission showed an EF of 4045%, normal wall motion, moderate concentric LHV, GR1DD, aortic valve was severely thickened and severely calcified with severe aortic stenosis with a mean gradient of 39 mmHg, VTI 0.86 cm^2, Vmax 0.76 cm^2. There was mild AI. The aortic root was mildly dilated at 45 mm and the ascending aorta was dilated at 50 mm. There was trivial MR. RV systolic function was normal. Mild TR/PR. PASP normal. Carotid doppler ultrasound was an overall difficult study due to high bifurcations and tortuosity with bilateral 1-39% ICA with antegrade vertebral flow. She  was started on Plavix. Prior to her current admission, she has only been out of the house one time since that January 2018 admission, which was to see her PCP for hospital follow up in late January.  She was admitted on 7/7 with low-grade fever, weakness, and some confusion. She was found to have severe sepsis 2/2 UTI. In the ED she recevied 3 L of IV fluids with improvement in her cognition. Her MAP remained < 65 and she was started on pressor support with Neo-Synephrine initially followed by weaing and initation of Levophed to reduce aferload and increase inotrope support. She developed new onset Afib with RVR with heart rates into the 140s on 7/7. She has not been able to be started on rate controlling medications such as beta blcokers or calcium channel blockers 2/2 hypotension requiring pressor support. She has remained on ASA and Plavix. Troponin was noted to be elevated at 0.25 on 7/7 and has trended to 0.39 as of early 7/8. Renal function has improved with IV fluids and pressor support from 1.37-->1.04 with a baseline ~ 0.6-0.7. Potassium 3.6. WBC has improved from 20.2-->17.3, HGB 10.6-->9.0 (likely dilutational with her IV fluids). Heart rate has remained mildly tachycardic in the low 100s to 110s bpm. She has not had any chest pain, increased SOB, dizziness, presyncope, or syncope. She reports she is feeling much better since her admission. She remains on Levophed with a MAP > 65 mmHg currently along with lactated ringers. She has not had any LE swelling and has stable orthopnea. Her family lastly notes since just prior to her admission she has  been unable to point with her right hand (stroke in 10/2016 affected her left side).     Past Medical History:  Diagnosis Date  . Aortic stenosis    a. EF of 4045%, normal wall motion, moderate concentric LHV, GR1DD, aortic valve was severely thickened and severely calcified with severe aortic stenosis with a mean gradient of 39 mmHg, VTI 0.86 cm^2, Vmax  0.76 cm^2. There was mild AI. The aortic root was mildly dilated at 45 mm and the ascending aorta was dilated at 50 mm. There was trivial MR. RV systolic fxn normal. Mild TR/PR. PASP nl  . B12 deficiency anemia    s/p Billroth 2  . Carcinoma, basal cell, skin   . Chronic systolic CHF (congestive heart failure) (HCC)    a. see echo in pmh  . COPD (chronic obstructive pulmonary disease) ([place])   . Diplopia   . Diverticular disease   . Hypertension    Unspecified  . Malnutrition ([place])   . Physical deconditioning   . PUD (peptic ulcer disease)   . Renal calculus   . Stroke ([place]) 10/2016  . Thoracic aortic aneurysm (HCC)    7 cm?  . TIA (transient ischemic attack) 2010      Most Recent Cardiac Studies: TTE 10/2016: Study Conclusions  - Left ventricle: There is akinesis of the basal and mid   inferolateral and inferior walls. The cavity size was normal.   Wall thickness was increased in a pattern of moderate LVH. There   was moderate concentric hypertrophy. Systolic function was mildly   reduced. The estimated ejection fraction was in the range of 40%   to 45%. Wall motion was normal; there were no regional wall   motion abnormalities. Doppler parameters are consistent with   abnormal left ventricular relaxation (grade 1 diastolic   dysfunction). Doppler parameters are consistent with elevated   ventricular end-diastolic filling pressure. - Aortic valve: Trileaflet; severely thickened, severely calcified   leaflets. Valve mobility was restricted. There was severe   stenosis. There was mild regurgitation. Mean gradient (S): 39 mm   Hg. Valve area (VTI): 0.86 cm^2. Valve area (Vmax): 0.76 cm^2.   Valve area (Vmean): 0.74 cm^2. - Aortic root: The aortic root was dilated measuring 45 mm. - Ascending aorta: The ascending aorta was dilated measuring 50 mm. - Mitral valve: There was trivial regurgitation. - Right ventricle: Systolic function was normal. - Tricuspid valve: There was mild  regurgitation. - Pulmonic valve: There was mild regurgitation. - Pulmonary arteries: Systolic pressure was within the normal   range. - Pericardium, extracardiac: There was no pericardial effusion.  Impressions:  - Compared to the prior study from 02/13/2013 there are significant   changes:     1. Aortic stenosis is now severe   2. There are new wall motion abnormalities in the RCA territory   3. LVEF has decreased from 55-60% to 40-45%   4. Aortic root remains dilated at 45 mm, ascending aorta is   dilated at 50 mm, previously reported as 85mm.  TTE 01/2013: Study Conclusions  - Left ventricle: The cavity size was normal. Wall thickness was increased in a pattern of mild LVH. There was focal basal hypertrophy. Systolic function was normal. The estimated ejection fraction was in the range of 60% to 65%. - Aortic valve: There was mild to moderate stenosis. Mild regurgitation. - Mitral valve: Mild regurgitation. - Tricuspid valve: Moderate regurgitation.   Surgical History:  Past Surgical History:  Procedure Laterality Date  .  BACK SURGERY  1986  . BASAL CELL CARCINOMA EXCISION    . Chest CT  10/21/2002   Ascending aneurysm; 5.5 X 5.5  . HERNIA REPAIR    . PARTIAL HYSTERECTOMY     for Fibroids  . STOMACH SURGERY  1968   X2 Billroth II; Vagotomysubtotal gastric/ gastrectomy (1989)  . TUBAL LIGATION       Home Meds: Prior to Admission medications   Medication Sig Start Date End Date Taking? Authorizing Provider  Aspirin-Caffeine (ANACIN) 400-32 MG TABS Take 2 tablets by mouth daily as needed (eadache).    Yes [provider]  atorvastatin (LIPITOR) 10 MG tablet Take 1 tablet (10 mg total) by mouth daily. 11/09/16  Yes Copland, Frederico Hamman, MD  clopidogrel (PLAVIX) 75 MG tablet Take 1 tablet (75 mg total) by mouth daily. 11/09/16  Yes Copland, Frederico Hamman, MD  cyanocobalamin (,VITAMIN B-12,) 1000 MCG/ML injection INJECT 1 ML INTERMUSCLUARLLY ONCE A  MONTH. Patient taking differently: Inject 1,000 mcg into the muscle every 30 (thirty) days.  11/09/16  Yes Copland, Frederico Hamman, MD  levothyroxine (SYNTHROID, LEVOTHROID) 25 MCG tablet Take 1 tablet (25 mcg total) by mouth daily before breakfast. 11/09/16  Yes Copland, Frederico Hamman, MD  metoprolol tartrate (LOPRESSOR) 25 MG tablet Take 0.5 tablets (12.5 mg total) by mouth 2 (two) times daily. 11/16/16  Yes Copland, Frederico Hamman, MD  Multiple Vitamin (MULTIVITAMIN WITH MINERALS) TABS tablet Take 1 tablet by mouth daily.   Yes [provider]  Multiple Vitamins-Minerals (PRESERVISION AREDS 2) CAPS Take 1 capsule by mouth 2 (two) times daily.   Yes [provider]  oxybutynin (DITROPAN) 5 MG tablet TAKE 1 TABLET TWICE DAILY 03/30/17  Yes Copland, Frederico Hamman, MD  nystatin cream (MYCOSTATIN) APPLY 1 APPLICATION TOPICALLY 2 (TWO) TIMES DAILY Patient taking differently: Apply 1 application topically 2 (two) times daily as needed.  05/12/16   Owens Loffler, MD    Inpatient Medications:  . aspirin  81 mg Oral Daily  . clopidogrel  75 mg Oral Daily  . heparin  5,000 Units Subcutaneous Q8H  . insulin aspart  1-3 Units Subcutaneous Q4H  . levothyroxine  25 mcg Oral QAC breakfast  . multivitamin with minerals  1 tablet Oral Daily  . oxybutynin  5 mg Oral BID   . sodium chloride    . ceFEPime (MAXIPIME) IV Stopped (04/23/17 0827)  . famotidine (PEPCID) IV Stopped (04/23/17 1425)  . lactated ringers 75 mL/hr at 04/23/17 0400  . norepinephrine (LEVOPHED) Adult infusion 2 mcg/min (04/23/17 1415)  . phenylephrine (NEO-SYNEPHRINE) Adult infusion Stopped (04/23/17 4562)    Allergies: No Known Allergies  Social History   Social History  . Marital status: Married    Spouse name: N/A  . Number of children: N/A  . Years of education: N/A   Occupational History  . Retired    Social History Main Topics  . Smoking status: Never Smoker  . Smokeless tobacco: Never Used  . Alcohol use No  . Drug use:  No  . Sexual activity: Not on file   Other Topics Concern  . Not on file   Social History Narrative   Lives with husband.    4 children, 2 sons and 2 daughters: Irving Burton and Dorian Pod live close by.   Exercise: minimal due to arthritis   Diet: heart healthy   Living Danasia Baker: Has one but not sure what is on it.                 Family  History  Problem Relation Age of Onset  . Cholecystitis Mother 64  . Stroke Father 55  . Cancer Maternal Aunt        facial CA  . Heart attack Neg Hx   . Other Neg Hx        CV/HBP  . Hypertension Neg Hx      Review of Systems: Review of Systems  Constitutional: Positive for chills, fever, malaise/fatigue and weight loss. Negative for diaphoresis.  HENT: Negative for congestion.   Eyes: Negative for discharge and redness.  Respiratory: Negative for cough, hemoptysis, sputum production, shortness of breath and wheezing.   Cardiovascular: Negative for chest pain, palpitations, orthopnea, claudication, leg swelling and PND.  Gastrointestinal: Negative for abdominal pain, blood in stool, heartburn, melena, nausea and vomiting.  Genitourinary: Negative for hematuria.  Musculoskeletal: Negative for falls and myalgias.  Skin: Negative for rash.  Neurological: Positive for weakness. Negative for dizziness, tingling, tremors, sensory change, speech change, focal weakness and loss of consciousness.  Endo/Heme/Allergies: Does not bruise/bleed easily.  Psychiatric/Behavioral: Negative for substance abuse. The patient is not nervous/anxious.   All other systems reviewed and are negative.   Labs:  Recent Labs  04/22/17 1411 04/23/17 0154  TROPONINI 0.25* 0.39*   Lab Results  Component Value Date   WBC 17.3 (H) 04/23/2017   HGB 9.0 (L) 04/23/2017   HCT 27.6 (L) 04/23/2017   MCV 94.8 04/23/2017   PLT 156 04/23/2017     Recent Labs Lab 04/22/17 0737 04/23/17 0154  NA 139 135  K 3.8 3.6  CL 110 111  CO2 17* 16*  BUN 31* 24*   CREATININE 1.37* 1.04*  CALCIUM 7.8* 7.3*  PROT 5.2*  --   BILITOT 0.6  --   ALKPHOS 85  --   ALT 9*  --   AST 22  --   GLUCOSE 81 77   Lab Results  Component Value Date   CHOL 143 10/27/2016   HDL 48 10/27/2016   LDLCALC 76 10/27/2016   TRIG 95 10/27/2016   No results found for: DDIMER  Radiology/Studies:  Dg Chest Portable 1 View  Result Date: 04/22/2017 IMPRESSION: Minimal heterogeneous opacities left lung base favored to represent atelectasis. Electronically Signed   By: Lovey Newcomer M.D.   On: 04/22/2017 08:11    EKG: Interpreted by me showed: Afib with RVR, 142 bpm, inferolateral TWI Telemetry: Interpreted by me showed: currently sinus rhythm with PACs  Weights: Filed Weights   04/22/17 0758 04/22/17 1400 04/23/17 0500  Weight: 98 lb (44.5 kg) 100 lb (45.4 kg) 110 lb 3.7 oz (50 kg)     Physical Exam: Blood pressure 96/65, pulse 86, temperature 98 F (36.7 C), temperature source Axillary, resp. rate (!) 24, height 5' (1.524 m), weight 110 lb 3.7 oz (50 kg), SpO2 96 %. Body mass index is 21.53 kg/m. General: Critically ill and frail appearing, in no acute distress. Head: Normocephalic, atraumatic, sclera non-icteric, no xanthomas, nares are without discharge.  Neck: Negative for carotid bruits. JVD not elevated. Lungs: Diminished breath sounds (exam limited by patient inability to lean forward/sit up - too weak). Breathing is unlabored. Heart: RRR with S1 S2. IV/VI systolic murmur along the aortic area, no rubs, or gallops appreciated. Abdomen: Soft, non-tender, non-distended with normoactive bowel sounds. No hepatomegaly. No rebound/guarding. No obvious abdominal masses. Msk:  Strength and tone appear frail for age. Extremities: No clubbing or cyanosis. No edema. Distal pedal pulses are 2+ and equal bilaterally. Neuro: Alert and oriented  X 3. No facial asymmetry. No focal deficit. Moves all extremities spontaneously. Psych:  Responds to questions appropriately with  a normal affect.    Assessment and Plan:  Principal Problem:   Severe sepsis ([place]) Active Problems:   Iron deficiency anemia   Aneurysm of thoracic aorta (HCC)   Elevated troponin   Protein-calorie malnutrition, severe (HCC)   UTI (urinary tract infection)   Severe aortic stenosis   New onset atrial fibrillation ([place])   AKI (acute kidney injury) ([place])    1. Severe aortic stenosis: -Recent TTE in 10/2016 with mean gradient of 39, peak gradient of 63, VTI 0.61. This is a progression of her known aortic disease when compared to echo in 2014. -She has been asymptomatic from her aortic stenosis, without chest pain, increased SOB, dizziness, presyncope, or syncope. -Unlikely a repeat TTE would change her course at this time (see below). Not a candidate for TEE or R/LHC at this time to further evaluate the degree of her stenosis given her acute medical illness requiring pressor support, severe protein calorie malnutrition, and deconditioning. -She Marcellas Marchant be very volume dependent, cautious use of IV fluids -There is some degree of aortic root dilatation as above, along with some reduction in her LVSF, though doubtful she would be a candidate for invasive procedure as below -Michaeal Davis need outpatient cardiology follow up  2. New onset Afib with RVR: -Currently in sinus tachycardia with frequent PACs with a heart rate in the 110s bpm -Asymptomatic -As her blood pressures allows, would restart her metoprolol vs carvedilol for rate control -Shamona Wirtz hold off on starting heparin gtt at this time in the setting of her acute infection and anemia, though given her elevated CHADS2VASc of at least 8 (CHF, HTN, age x 2, stroke x 2, vascular disease, female) she Candyce Gambino need long term, full-dose anticoagulation at discharge -Likely exacerbated by her acute illness with septic shock 2/2 UTI, anemia, and inotropic support  3. Elevated troponin: -Mildly elevated likely in the setting of supply demand ischemia 2/2 septic  shock, UTI, severe aortic stenosis, and anemia -Asymptomatic -Continue to cycle troponin until peak -Hold on starting heparin gtt at this time unless there is a dynamic elevation in her troponin -Could consider TTE to evaluate for reduction in LVSF -Continue ASA and Plavix -Start beta blocker when able  4. Chronic systolic CHF: -She does not appear to be volume overloaded at this time -When BP allows, would start metoprolol vs carvedilol  -If there is BP room at a later date, consider ACEi/ARB/spironolactone   5. Septic shock: -In the setting of UTI -Wean Levophed as able per primary service -ABX per primary  6. Malnutrition/deconditioning: -Albumin 2.3 at time of admission -The family reports prior to her current admission, she has only left the house one time since her January 2018 admission and that was to see her PCP for hospital follow up in late January 2018. Give her frail state (unable to sit up in the bed) she is unlikely to be a candidate for any invasive procedure -Consultation to Palliative Care could be considered  -She is a DNR/DNI  7. AKI: -Likely ATN 2/2 hypotension -Improving -Monitor   Signed, Marcille Blanco [facility] Pager: [phone] 04/23/2017, 3:02 PM  I have seen and examined this patient with Christell Faith.  Agree with above, note added to reflect my findings.  On exam, iRRR, 2/6 systolic murmur at the base, lungs clear.  Presented to the hospital with sepsis of urinary origin. Overnight she  had atrial fibrillation with rapid rates. Transthoracic echo from January 2018, during an admission for CVA, showed severe aortic stenosis with an ejection fraction of 40-45%. Currently she is admitted for septic shock. She received 3 units of IV fluid in the emergency room with improvement in her cognition. She remains on pressure support with Levothroid. At times, her heart rates and got into the 140s, but have improved with resolution of some of her  symptoms.  She does not appear to be volume overloaded at this time. Continue resuscitation as per critical care. She Alinah Sheard need optimal medical therapy for her heart failure at discharge. Would plan for ACE inhibitor versus ARB as blood pressure tolerates. Would also restart her beta blocker at discharge. She has had atrial fibrillation and has a history of stroke. When she is medically stable, would start her on Eliquis (2.5mg  BID).  Hold plavix per neurology.  Issiah Huffaker M. Khaled Herda MD 04/23/2017 3:05 PM

## 2017-04-23 NOTE — Progress Notes (Signed)
PULMONARY / CRITICAL CARE MEDICINE   Name: [name] [name] MRN: [id number] DOB: April 06, 1926    ADMISSION DATE:  04/22/2017 CONSULTATION DATE:  04/22/2017  REFERRING MD:  Tomi Bamberger  CHIEF COMPLAINT:  weakness  HISTORY OF PRESENT ILLNESS:   83 y/o female admitted on 7/7 with weakness in the setting of a UTI causing septic shock.    SUBJECTIVE:  Afib overnight Feels better today   VITAL SIGNS: BP (!) 84/55   Pulse (!) 104   Temp 98.5 F (36.9 C) (Oral)   Resp (!) 23   Ht 5' (1.524 m)   Wt 45.4 kg (100 lb)   SpO2 96%   BMI 19.53 kg/m   HEMODYNAMICS:    VENTILATOR SETTINGS:    INTAKE / OUTPUT: I/O last 3 completed shifts: In: 5805 [I.V.:2455; IV Piggyback:3350] Out: 1365 [Urine:1365]  PHYSICAL EXAMINATION:  General:  Resting comfortably in bed HENT: NCAT OP clear PULM: CTA B, normal effort CV: Irreg irreg systolic murmur noted GI: BS+, soft, nontender MSK: normal bulk and tone Neuro: awake, alert, no distress, MAEW   LABS:  BMET  Recent Labs Lab 04/22/17 0737 04/23/17 0154  NA 139 135  K 3.8 3.6  CL 110 111  CO2 17* 16*  BUN 31* 24*  CREATININE 1.37* 1.04*  GLUCOSE 81 77    Electrolytes  Recent Labs Lab 04/22/17 0737 04/23/17 0154  CALCIUM 7.8* 7.3*  MG  --  1.2*  PHOS  --  3.1    CBC  Recent Labs Lab 04/22/17 0737 04/23/17 0154  WBC 20.2* 17.3*  HGB 10.6* 9.0*  HCT 33.3* 27.6*  PLT 181 156    Coag's  Recent Labs Lab 04/22/17 0737 04/22/17 1411  APTT  --  39*  INR 1.24 1.31    Sepsis Markers  Recent Labs Lab 04/22/17 0950 04/22/17 1411 04/22/17 1625  LATICACIDVEN 2.2* 1.4 1.2  PROCALCITON 8.97 15.54  --     ABG No results for input(s): PHART, PCO2ART, PO2ART in the last 168 hours.  Liver Enzymes  Recent Labs Lab 04/22/17 0737  AST 22  ALT 9*  ALKPHOS 85  BILITOT 0.6  ALBUMIN 2.3*    Cardiac Enzymes  Recent Labs Lab 04/22/17 1411 04/23/17 0154  TROPONINI 0.25* 0.39*    Glucose  Recent  Labs Lab 04/22/17 1358 04/22/17 1600 04/22/17 1935 04/23/17 0018 04/23/17 0348  GLUCAP 84 81 85 69 74    Imaging Dg Chest Portable 1 View  Result Date: 04/22/2017 CLINICAL DATA:  Patient with fever and tachycardia. EXAM: PORTABLE CHEST 1 VIEW COMPARISON:  Chest radiograph 10/26/2016. FINDINGS: Patient is rotated to the left. Monitoring leads overlie the patient. Stable enlarged cardiac and mediastinal contours. Aortic vascular calcifications. Minimal heterogeneous opacities left lung base. No pleural effusion or pneumothorax. IMPRESSION: Minimal heterogeneous opacities left lung base favored to represent atelectasis. Electronically Signed   By: Lovey Newcomer M.D.   On: 04/22/2017 08:11     STUDIES:    CULTURES: 7/7 blood culture > pseudomonas aeruginosa 7/7 urine culture >   ANTIBIOTICS: 7/7 cefepime >  7/7 levaquin > 7/8  SIGNIFICANT EVENTS: 7/7 admission  LINES/TUBES:   DISCUSSION: 83 y/o female with septic shock from a UTI and pseudomonas bacteremia leading to AKI and demand ischemia.  ASSESSMENT / PLAN:  PULMONARY A: No acute issues P:   Monitor O2 saturation  CARDIOVASCULAR A:  Septic shock  Demand ischemia History of HTN Severe aortic stenosis and chronic systolic heart failure based on 10/2016 echocardiogram New  Afib P:  Wean off neo now to reduce afterload If still needs pressors then change to low dose levophed for more inotrope support Continue ASA, Plavix Tele Cardiology consult Consider echo  RENAL A:   AKI> improving Non-gap metabolic acidosis likely due to pre-admission bactrim and AKI P:   Monitor BMET and UOP Replace electrolytes as needed Continue LR for now > cautious   GASTROINTESTINAL A:   No acute issues P:   Advance diet Continue famotidine for now  HEMATOLOGIC A:   Anemia without bleeding P:  No acute issues  INFECTIOUS A:   Pseudomonas bacteremia UTI P:   Stop levaquin Start cefepime Follow up  sensitivities  ENDOCRINE A:   Hypothyroidism P:   Continue synthroid  NEUROLOGIC A:   No acute issues P:     FAMILY  - Updates: daughters updated bedside   - Inter-disciplinary family meet or Palliative Care meeting due by:  day 7  My cc time 33 minutes  Roselie Awkward, MD [place] [facility] Pager: [phone] Cell: [phone] After 3pm or if no response, call [phone]  04/23/2017, 7:38 AM

## 2017-04-23 NOTE — Progress Notes (Addendum)
Called elink Dr Elsworth Soho to report pt afib 109-140s, sbp 80. Will give ns fluid bolus. cbg 33, pt daughter at bedside states pt can not have dairy or juice products d/t hx of diverticular disease, will push po fluids tolerable to pt per md instructions.

## 2017-04-23 NOTE — Progress Notes (Signed)
PHARMACY - PHYSICIAN COMMUNICATION CRITICAL VALUE ALERT - BLOOD CULTURE IDENTIFICATION (BCID)  Results for orders placed or performed during the hospital encounter of 04/22/17  Blood Culture ID Panel (Reflexed) (Collected: 04/22/2017  7:56 AM)  Result Value Ref Range   Enterococcus species NOT DETECTED NOT DETECTED   Listeria monocytogenes NOT DETECTED NOT DETECTED   Staphylococcus species NOT DETECTED NOT DETECTED   Staphylococcus aureus NOT DETECTED NOT DETECTED   Streptococcus species NOT DETECTED NOT DETECTED   Streptococcus agalactiae NOT DETECTED NOT DETECTED   Streptococcus pneumoniae NOT DETECTED NOT DETECTED   Streptococcus pyogenes NOT DETECTED NOT DETECTED   Acinetobacter baumannii NOT DETECTED NOT DETECTED   Enterobacteriaceae species NOT DETECTED NOT DETECTED   Enterobacter cloacae complex NOT DETECTED NOT DETECTED   Escherichia coli NOT DETECTED NOT DETECTED   Klebsiella oxytoca NOT DETECTED NOT DETECTED   Klebsiella pneumoniae NOT DETECTED NOT DETECTED   Proteus species NOT DETECTED NOT DETECTED   Serratia marcescens NOT DETECTED NOT DETECTED   Carbapenem resistance NOT DETECTED NOT DETECTED   Haemophilus influenzae NOT DETECTED NOT DETECTED   Neisseria meningitidis NOT DETECTED NOT DETECTED   Pseudomonas aeruginosa DETECTED (A) NOT DETECTED   Candida albicans NOT DETECTED NOT DETECTED   Candida glabrata NOT DETECTED NOT DETECTED   Candida krusei NOT DETECTED NOT DETECTED   Candida parapsilosis NOT DETECTED NOT DETECTED   Candida tropicalis NOT DETECTED NOT DETECTED    Name of physician (or Provider) Contacted: Dr. Elsworth Soho   Changes to prescribed antibiotics required: D/C ceftriaxone and start double coverage with Cefepime and Levaquin per Dr. Elsworth Soho.   Argie Ramming, PharmD Clinical Pharmacist 04/23/17 6:24 AM

## 2017-04-23 NOTE — Progress Notes (Addendum)
eLink Physician-Brief Progress Note Patient Name: [name] [name] DOB: [birth year] MRN: [id number]   Date of Service  04/23/2017  HPI/Events of Note  Urine cx >> pseudomonas  eICU Interventions  Received zosyn x1 Use cefepime + levaquin until sens back Dc ceftx     Intervention Category Intermediate Interventions: Diagnostic test evaluation  Feliciano Wynter V. 04/23/2017, 6:27 AM

## 2017-04-24 DIAGNOSIS — R6520 Severe sepsis without septic shock: Secondary | ICD-10-CM

## 2017-04-24 LAB — BASIC METABOLIC PANEL
ANION GAP: 5 (ref 5–15)
BUN: 14 mg/dL (ref 6–20)
CHLORIDE: 113 mmol/L — AB (ref 101–111)
CO2: 19 mmol/L — ABNORMAL LOW (ref 22–32)
Calcium: 7.7 mg/dL — ABNORMAL LOW (ref 8.9–10.3)
Creatinine, Ser: 0.77 mg/dL (ref 0.44–1.00)
Glucose, Bld: 86 mg/dL (ref 65–99)
POTASSIUM: 3.6 mmol/L (ref 3.5–5.1)
SODIUM: 137 mmol/L (ref 135–145)

## 2017-04-24 LAB — GLUCOSE, CAPILLARY
GLUCOSE-CAPILLARY: 144 mg/dL — AB (ref 65–99)
GLUCOSE-CAPILLARY: 76 mg/dL (ref 65–99)
GLUCOSE-CAPILLARY: 85 mg/dL (ref 65–99)
Glucose-Capillary: 73 mg/dL (ref 65–99)
Glucose-Capillary: 99 mg/dL (ref 65–99)
Glucose-Capillary: 106 mg/dL — ABNORMAL HIGH (ref 65–99)

## 2017-04-24 MED ORDER — DEXTROSE 5 % IV SOLN
2.0000 g | INTRAVENOUS | Status: AC
  Administered 2017-04-24 – 2017-04-26 (×3): 2 g via INTRAVENOUS
  Filled 2017-04-24 (×3): qty 2

## 2017-04-24 MED ORDER — METOPROLOL TARTRATE 12.5 MG HALF TABLET
12.5000 mg | ORAL_TABLET | Freq: Two times a day (BID) | ORAL | Status: DC
  Administered 2017-04-24 – 2017-04-27 (×8): 12.5 mg via ORAL
  Filled 2017-04-24 (×9): qty 1

## 2017-04-24 MED ORDER — APIXABAN 2.5 MG PO TABS
2.5000 mg | ORAL_TABLET | Freq: Two times a day (BID) | ORAL | Status: DC
  Filled 2017-04-24: qty 1

## 2017-04-24 MED ORDER — ZOLPIDEM TARTRATE 5 MG PO TABS
5.0000 mg | ORAL_TABLET | Freq: Once | ORAL | Status: AC
  Administered 2017-04-24: 5 mg via ORAL
  Filled 2017-04-24: qty 1

## 2017-04-24 MED ORDER — INSULIN ASPART 100 UNIT/ML ~~LOC~~ SOLN: 1.0000 [IU] | Freq: Three times a day (TID) | SUBCUTANEOUS | Status: DC

## 2017-04-24 MED ORDER — APIXABAN 2.5 MG PO TABS
2.5000 mg | ORAL_TABLET | Freq: Two times a day (BID) | ORAL | Status: DC
  Administered 2017-04-24 – 2017-04-28 (×9): 2.5 mg via ORAL
  Filled 2017-04-24 (×9): qty 1

## 2017-04-24 NOTE — Progress Notes (Signed)
     Consult note reviewed. No new recommendations. Will sign off. Please let us know if we can be of further assistance.  Candee Furbish, MD

## 2017-04-24 NOTE — Care Management Note (Signed)
Case Management Note  Patient Details  Name: [name] [name] MRN: [id number] Date of Birth: 10/22/1925  Subjective/Objective:    Pt admitted with septic shock - initially requiring pressors  Action/Plan:  Pt admitted from home - has support of daughters.  PT ordered                   Expected Discharge Date:                  Expected Discharge Plan:   (from home)  In-House Referral:     Discharge planning Services  CM Consult  Post Acute Care Choice:    Choice offered to:     DME Arranged:    DME Agency:     HH Arranged:    HH Agency:     Status of Service:     If discussed at [employer], dates discussed:    Additional Comments:  Maryclare Labrador, RN 04/24/2017, 4:33 PM

## 2017-04-24 NOTE — Progress Notes (Signed)
PULMONARY / CRITICAL CARE MEDICINE   Name: [name] [name] MRN: [id number] DOB: [birth year]    ADMISSION DATE:  04/22/2017   REFERRING MD:   Tomi Bamberger  CHIEF COMPLAINT:  weakness  HISTORY OF PRESENT ILLNESS:   83 y/o female admitted on 7/7 with weakness in the setting of a UTI causing septic shock now off pressors. Also with new onset afib with RVR  SUBJECTIVE:  No acute overnight events.   VITAL SIGNS: BP 111/79   Pulse 89   Temp 98.1 F (36.7 C) (Oral)   Resp (!) 23   Ht 5' (1.524 m)   Wt 110 lb 3.7 oz (50 kg)   SpO2 97%   BMI 21.53 kg/m   HEMODYNAMICS:    VENTILATOR SETTINGS:    INTAKE / OUTPUT: I/O last 3 completed shifts: In: 7478.4 [P.O.:250; I.V.:3728.4; IV Piggyback:3500] Out: 2715 [Urine:2715]  PHYSICAL EXAMINATION: General:  Appearing comfortable andin NAD Neuro:  Awake, alert and in no distress HEENT:  AT/[place], MMM Cardiovascular:  Irregularly irregular with systolic murmur Lungs:  CTABL Abdomen:  Soft NTND, +BS Musculoskeletal:  No gross deformities Extremities: no edema  LABS:  BMET  Recent Labs Lab 04/22/17 0737 04/23/17 0154 04/24/17 0158  NA 139 135 137  K 3.8 3.6 3.6  CL 110 111 113*  CO2 17* 16* 19*  BUN 31* 24* 14  CREATININE 1.37* 1.04* 0.77  GLUCOSE 81 77 86    Electrolytes  Recent Labs Lab 04/22/17 0737 04/23/17 0154 04/24/17 0158  CALCIUM 7.8* 7.3* 7.7*  MG  --  1.2*  --   PHOS  --  3.1  --     CBC  Recent Labs Lab 04/22/17 0737 04/23/17 0154  WBC 20.2* 17.3*  HGB 10.6* 9.0*  HCT 33.3* 27.6*  PLT 181 156    Coag's  Recent Labs Lab 04/22/17 0737 04/22/17 1411  APTT  --  39*  INR 1.24 1.31    Sepsis Markers  Recent Labs Lab 04/22/17 0950 04/22/17 1411 04/22/17 1625  LATICACIDVEN 2.2* 1.4 1.2  PROCALCITON 8.97 15.54  --     ABG No results for input(s): PHART, PCO2ART, PO2ART in the last 168 hours.  Liver Enzymes  Recent Labs Lab 04/22/17 0737  AST 22  ALT 9*  ALKPHOS 85  BILITOT  0.6  ALBUMIN 2.3*    Cardiac Enzymes  Recent Labs Lab 04/22/17 1411 04/23/17 0154  TROPONINI 0.25* 0.39*    Glucose  Recent Labs Lab 04/23/17 0908 04/23/17 1211 04/23/17 1542 04/23/17 1919 04/24/17 0013 04/24/17 0408  GLUCAP 121* 116* 124* 98 76 85    Imaging No results found.  CULTURES: 7/7 blood culture > pseudomonas aeruginosa 7/7 urine culture >   ANTIBIOTICS: 7/7 cefepime 7/7 levaquin > 7/8  SIGNIFICANT EVENTS: 7/7 admission  LINES/TUBES: Urethral cath 7/8>> PIV x2 7/8>>  DISCUSSION: 83 y/o female with septic shock from a UTI and pseudomonas bacteremia leading to AKI and demand ischemia with new onset afib with RVR currently rate controlled.   ASSESSMENT / PLAN:  PULMONARY A: No acute issues P:   Monitor O2 saturation  CARDIOVASCULAR A:  Septic shock  Demand ischemia History of HTN Severe aortic stenosis and chronic systolic heart failure based on 10/2016 echocardiogram New Afib with RVR P:  Wean off neo now to reduce afterload If still needs pressors then change to low dose levophed for more inotrope support Continue ASA, Plavix Tele Cardiology consulted; recommending initiating ACEi as BP tolerates, also restart metoprolol and eliquis 2.5mg  BID  at discharge Consider echo  RENAL A:   AKI, resolved Non-gap metabolic acidosis likely due to pre-admission bactrim and AKI P:   Monitor BMET and UOP Replace electrolytes as needed NS KVO'd; LR @ 75cc/hr  GASTROINTESTINAL A:   No acute issues P:   Regular diet Transition from pepcid infusion to PO  HEMATOLOGIC A:   Anemia without bleeding P:  No acute issues  INFECTIOUS A:   Pseudomonas bacteremia UTI P:   Stop levaquin Continue cefepime  Follow up sensitivities  ENDOCRINE A:   Hypothyroidism CBGs WNL P:   Continue synthroid CBGs q4hrs  NEUROLOGIC A:   No acute issues P:   Continue to monitor neuro status   FAMILY  - Updates: daughters  updated bedside   - Inter-disciplinary family meet or Palliative Care meeting due by:  day 7   Pulmonary and [place] Pager: [phone]  04/24/2017, 6:57 AM

## 2017-04-24 NOTE — Progress Notes (Signed)
Pharmacy Antibiotic Note  [name] [name] is a 83 y.o. female admitted on 04/22/2017 with weakness, found to have pseudomonas bacteremia.   Plan: Adjust Cefepime to 2g IV q24hr  Monitor renal function, clinical picture, and culture results F/u length of therapy and de-escalation   Height: 5' (152.4 cm) Weight: 110 lb 3.7 oz (50 kg) IBW/kg (Calculated) : 45.5  Temp (24hrs), Avg:98.2 F (36.8 C), Min:97.8 F (36.6 C), Max:98.7 F (37.1 C)   Recent Labs Lab 04/22/17 0737 04/22/17 0804 04/22/17 0950 04/22/17 1411 04/22/17 1625 04/23/17 0154 04/24/17 0158  WBC 20.2*  --   --   --   --  17.3*  --   CREATININE 1.37*  --   --   --   --  1.04* 0.77  LATICACIDVEN  --  2.06* 2.2* 1.4 1.2  --   --     Estimated Creatinine Clearance: 34.9 mL/min (by C-G formula based on SCr of 0.77 mg/dL).    No Known Allergies  Levester Fresh, PharmD, BCPS, BCCCP Clinical Pharmacist Clinical phone for 04/24/2017 from 7a-3:30p: [phone] If after 3:30p, please call main pharmacy at: [phone] 04/24/2017 7:46 AM

## 2017-04-24 NOTE — Progress Notes (Signed)
Initial Nutrition Assessment  DOCUMENTATION CODES:   Severe malnutrition in context of chronic illness  INTERVENTION:    High Protein HS snack daily  NUTRITION DIAGNOSIS:   Malnutrition (severe) related to chronic illness (COPD, CHF) as evidenced by severe depletion of body fat, severe depletion of muscle mass.  GOAL:   Patient will meet greater than or equal to 90% of their needs  MONITOR:   PO intake, Skin, I & O's  REASON FOR ASSESSMENT:   Malnutrition Screening Tool    ASSESSMENT:   83 yo female with PMH of HTN, diverticular disease, COPD, B12 deficiency, PUD, skin CA, stroke, CHF, malnutrition who was admitted on 7/7 with weakness, found to have pseudomonas bacteremia.   Spoke with patient and her daughter. Patient usually eats very well. She avoids milk-based products and juices due to stomach surgery years ago. She does not drink any PO supplements (Ensure, Boost, Breeze, etc) because she does not tolerate them. She consumed ~50% of her breakfast today. She agreed to receive an HS snack, stating that the meal times are irregular.  Nutrition-Focused physical exam completed. Findings are mild-moderate and severe fat depletion, mild-moderate and severe muscle depletion, and no edema.   Labs and medications reviewed.   Diet Order:  Diet regular Room service appropriate? Yes; Fluid consistency: Thin  Skin:   (skin tear to right arm)  Last BM:  7/9  Height:   Ht Readings from Last 1 Encounters:  04/22/17 5' (1.524 m)    Weight:   Wt Readings from Last 1 Encounters:  04/24/17 108 lb 0.4 oz (49 kg)    Ideal Body Weight:  45.5 kg  BMI:  Body mass index is 21.1 kg/m.  Estimated Nutritional Needs:   Kcal:  1200-1400  Protein:  60-70 gm  Fluid:  1.4 L  EDUCATION NEEDS:   No education needs identified at this time  Molli Barrows, [place], [place], [place] Pager [phone] After Hours Pager [phone]

## 2017-04-24 NOTE — Progress Notes (Signed)
[place] Progress Note Patient Name: [name] [name] DOB: [birth year] MRN: [id number]   Date of Service  04/24/2017  HPI/Events of Note  Not sleeping well in the ICU. Requests sleep aide.   eICU Interventions  Will order Ambien 5 mg PO at 9 PM.     Intervention Category Major Interventions: Other:  Lysle Dingwall 04/24/2017, 6:48 PM

## 2017-04-25 ENCOUNTER — Encounter (HOSPITAL_COMMUNITY): Payer: Self-pay | Admitting: Internal Medicine

## 2017-04-25 DIAGNOSIS — R404 Transient alteration of awareness: Secondary | ICD-10-CM

## 2017-04-25 DIAGNOSIS — I359 Nonrheumatic aortic valve disorder, unspecified: Secondary | ICD-10-CM

## 2017-04-25 DIAGNOSIS — Z8673 Personal history of transient ischemic attack (TIA), and cerebral infarction without residual deficits: Secondary | ICD-10-CM

## 2017-04-25 DIAGNOSIS — R7989 Other specified abnormal findings of blood chemistry: Secondary | ICD-10-CM

## 2017-04-25 DIAGNOSIS — R410 Disorientation, unspecified: Secondary | ICD-10-CM | POA: Diagnosis present

## 2017-04-25 DIAGNOSIS — A4152 Sepsis due to Pseudomonas: Secondary | ICD-10-CM | POA: Diagnosis present

## 2017-04-25 LAB — BASIC METABOLIC PANEL
Anion gap: 6 (ref 5–15)
BUN: 12 mg/dL (ref 6–20)
CHLORIDE: 111 mmol/L (ref 101–111)
CO2: 22 mmol/L (ref 22–32)
CREATININE: 0.66 mg/dL (ref 0.44–1.00)
Calcium: 7.8 mg/dL — ABNORMAL LOW (ref 8.9–10.3)
GFR calc non Af Amer: >60 mL/min (ref >60)
Glucose, Bld: 83 mg/dL (ref 65–99)
POTASSIUM: 3.7 mmol/L (ref 3.5–5.1)
Sodium: 139 mmol/L (ref 135–145)

## 2017-04-25 LAB — CULTURE, BLOOD (ROUTINE X 2)
SPECIAL REQUESTS: ADEQUATE
SPECIAL REQUESTS: ADEQUATE

## 2017-04-25 LAB — GLUCOSE, CAPILLARY: Glucose-Capillary: 140 mg/dL — ABNORMAL HIGH (ref 65–99)

## 2017-04-25 LAB — URINE CULTURE: Culture: 20000 — AB

## 2017-04-25 LAB — CBC
HEMATOCRIT: 29.4 % — AB (ref 36.0–46.0)
HEMOGLOBIN: 9.7 g/dL — AB (ref 12.0–15.0)
MCH: 30.9 pg (ref 26.0–34.0)
MCHC: 33 g/dL (ref 30.0–36.0)
MCV: 93.6 fL (ref 78.0–100.0)
Platelets: 165 10*3/uL (ref 150–400)
RBC: 3.14 MIL/uL — AB (ref 3.87–5.11)
RDW: 15.3 % (ref 11.5–15.5)
WBC: 6.9 10*3/uL (ref 4.0–10.5)

## 2017-04-25 MED ORDER — QUETIAPINE FUMARATE 25 MG PO TABS
25.0000 mg | ORAL_TABLET | Freq: Every day | ORAL | Status: DC
  Administered 2017-04-25: 25 mg via ORAL
  Filled 2017-04-25: qty 1

## 2017-04-25 MED ORDER — MAGNESIUM SULFATE 4 GM/100ML IV SOLN
4.0000 g | Freq: Once | INTRAVENOUS | Status: AC
  Administered 2017-04-25: 4 g via INTRAVENOUS
  Filled 2017-04-25: qty 100

## 2017-04-25 NOTE — Progress Notes (Signed)
Progress Note    [name] [name]  WVP:[id number] DOB: [birth year]  DOA: 04/22/2017 PCP: Owens Loffler, MD    Brief Narrative:   Chief complaint: Follow-up sepsis  Medical records reviewed and are as summarized below:  [name] [name] is an 83 y.o. female with a PMH of AAA, COPD, hypertension and TIA who was admitted by the critical care team on 04/22/17 with low-grade fever, weakness and confusion. Upon initial presentation, she was hypotensive requiring 3 L of fluid volume resuscitation in the ED. Source of sepsis thought to be from UTI. Urine cultures grew Pseudomonas.   Assessment/Plan:   Principal Problem:   Severe sepsis (HCC)/Septic shock secondary to Pseudomonas UTI and bacteremia Currently being managed with cefepime and vancomycin. Required pressor support. Sepsis complicated by acute kidney injury and demand ischemia. Hemodynamically stable other than some mild tachypnea. WBC count has normalized. Lactate has cleared. ID recommends 10 days of antibiotics once improved, and can switch to oral route if organism is quinolone sensitive.Mobilize. Likely switch to oral antibiotics tomorrow.  Active Problems:   Delirium Patient continues to have delirium, hallucinations. We'll start low-dose Seroquel at night given insomnia.    Hypothyroidism Continue Synthroid. TSH last checked 10/27/16 and was 6.805.    Iron deficiency anemia Hemoglobin stable.    Aneurysm of thoracic aorta (HCC) Stable.    Severe aortic stenosis Stable.    New onset atrial fibrillation (HCC)/demand ischemia/elevated troponin Evaluated by cardiology and currently being managed with beta blocker, aspirin, Plavix and Eliquis. Cardiology also recommended initiating ACE inhibitor as blood pressure tolerates. Discussed current management with Dr. Marlou Porch who recommended discontinuing aspirin and Plavix.    AKI (acute kidney injury) ([facility]) Secondary to sepsis. Resolved. Creatinine reached a high of 1.37,  currently back to baseline values.    Protein-calorie malnutrition, severe (HCC) Body mass index is 20.49 kg/m.   Family Communication/Anticipated D/C date and plan/Code Status   DVT prophylaxis: Eliquis ordered. Code Status: Full Code.  Family Communication: Daughters updated at bedside. Disposition Plan: Home with home health services when stable, family declined SNF placement.   Medical Consultants:    PCCM  Cardiology   Procedures:    None  Anti-Infectives:    None  Subjective:   RN reports delirium symptoms (seeing clowns in the corner). No vomiting or diarrhea, moved her bowels yesterday. No chest pain. Not sleeping well.  Took Ambien last night but only slept 2 hours.  Objective:    Vitals:   04/25/17 0900 04/25/17 1000 04/25/17 1106 04/25/17 1200  BP:  111/71  110/71  Pulse: 93 76  73  Resp: 18 17  17   Temp:   97.6 F (36.4 C)   TempSrc:   Oral   SpO2: 95% 97%  95%  Weight:      Height:        Intake/Output Summary (Last 24 hours) at 04/25/17 1347 Last data filed at 04/25/17 1200  Gross per 24 hour  Intake              720 ml  Output              885 ml  Net             -165 ml   Filed Weights   04/23/17 0500 04/24/17 1122 04/25/17 0331  Weight: 50 kg (110 lb 3.7 oz) 49 kg (108 lb 0.4 oz) 47.6 kg (104 lb 15 oz)    Exam: General exam: Frail elderly female  in no acute distress. Respiratory system: Lungs are clear, normal respiratory effort.  Cardiovascular system: Heart sounds are regular with no murmurs, rubs, or gallops. Gastrointestinal system: Abdomen is soft, nontender, nondistended with normal active bowel sounds. Central nervous system: Alert and oriented 2. No focal neurological deficits.Delirium present with ongoing visual hallucinations. Extremities: No clubbing,  or cyanosis. No edema. Skin: No rashes, lesions or ulcers. Psychiatry: Judgement and insight appear impaired. Mood & affect depressed and anxious.   Data Reviewed:     I have personally reviewed following labs and imaging studies:  Labs: Basic Metabolic Panel: Labs are significant for a slightly low calcium at 7.8. Other electrolytes normal. WBC 6.9, hemoglobin 9.7.  Culture results:  Blood culture 2 from 04/22/17 positive for Pseudomonas, Cipro sensitive. Urine culture from 04/22/17 grew <10,000 colonies of Pseudomonas.  Radiology: Chest x-ray 04/22/17: Left lung atelectasis.   Medications:   . apixaban  2.5 mg Oral BID  . levothyroxine  25 mcg Oral QAC breakfast  . metoprolol tartrate  12.5 mg Oral BID  . multivitamin with minerals  1 tablet Oral Daily  . oxybutynin  5 mg Oral BID  . QUEtiapine  25 mg Oral QHS   Continuous Infusions: . sodium chloride 250 mL (04/25/17 0700)  . ceFEPime (MAXIPIME) IV Stopped (04/25/17 0901)    Medical decision making is of high complexity and this patient is at high risk of deterioration, therefore this is a level 3 visit.  (> 4 problem points, >4 data points, high risk: Need 2 out of 3)   Problems/DDx Points   Self limited or minor (max 2)       1   Established problem, stable       1    Established problem, worsening: delirium       2  2  New problem, no additional W/U planned (max 1) afib       3  3  New problem, additional W/U planned        4    Data Reviewed Points   Review/order clinical lab tests       1  1  Review/order x-rays       1   Review/order tests (Echo, EKG, PFTs, etc)       1   Discussion of test results w/ performing MD       1   Independent review of image, tracing or specimen       2  2  Decision to obtain old records       1   Review and summation of old records       2  2   Level of risk Presenting prob Diagnostics Management   Minimal 1 self limited/minor Labs CXR EKG/EEG U/A U/S Rest Gargles Bandages Dressings   Low 2 or more self limited/minor 1 stable chronic Acute uncomplicated illness Tests (PFTS) Non-CV imaging Arterial labs Biopsies of skin OTC  drugs Minor surgery-no risk PT OT IVF without additives    Moderate 1 or more chronic illnesses w/ mild exac, progression or S/E from tx 2 or more stable chronic illnesses Undiagnosed new problem w/ uncertain prognosis Acute complicated injury  Stress tests Endoscopies with no risk factors Deep needle or incisional bx CV imaging without risk LP Thoracentesis Paracentesis Minor surgery w/ risks Elective major surgery w/ no risk (open, percutaneous or endoscopic) Prescription drugs Therapeutic nucl med IVF with additives Closed tx of fracture/dislocation    High Severe exac of chronic illness  Acute or chronic illness/injury may pose a threat to life or bodily function (ARF) Change in neuro status    CV imaging w/ contrast and risk Cardio electophysiologic tests Endoscopies w/ risk Discography Elective major surgery Emergency major surgery Parenteral controlled substances Drug therapy req monitoring for toxicity DNR/de-escalation of care    MDM Prob points Data points Risk   Straightforward    <1    <1    Min   Low complexity    2    2    Low   Moderate    3    3    Mod   High Complexity    4 or more    4 or more    High      LOS: 3 days   [name] [name]  [facility] Pager [phone]. If unable to reach me by pager, please call my cell phone at [phone].  *Please refer to [email or web address], password [email or web address] to get updated schedule on who will round on this patient, as hospitalists switch teams weekly. If 7PM-7AM, please contact night-coverage at [email or web address], password [email or web address] for any overnight needs.  04/25/2017, 1:47 PM

## 2017-04-25 NOTE — Evaluation (Addendum)
Physical Therapy Evaluation Patient Details Name: [name] [name] MRN: [id number] DOB: 1926-04-03 Today's Date: 04/25/2017   History of Present Illness  Pt adm with sepsis due to pseudomonas UTI.  PMH - HTN, CVA, TIA, copd, AAA  Clinical Impression  Pt admitted with above diagnosis and presents to PT with functional limitations due to deficits listed below (See PT problem list). Pt needs skilled PT to maximize independence and safety to allow discharge to home with family providing 24 hour assistance. Pt's daughters very supportive and will provide whatever assistance pt needs. Expect pt will make steady progress.     Follow Up Recommendations Home health PT;Supervision/Assistance - 24 hour    Equipment Recommendations  None recommended by PT    Recommendations for Other Services       Precautions / Restrictions Precautions Precautions: Fall Restrictions Weight Bearing Restrictions: No      Mobility  Bed Mobility Overal bed mobility: Needs Assistance Bed Mobility: Supine to Sit     Supine to sit: Max assist;HOB elevated     General bed mobility comments: Assist to bring legs off bed, elevate trunk into sitting, and bring hips to EOB  Transfers Overall transfer level: Needs assistance Equipment used: 4-wheeled walker Transfers: Sit to/from [facility] Sit to Stand: Mod assist Stand pivot transfers: Mod assist       General transfer comment: Assist to bring hips up. Assist for balance and manuvering of rollator for pivot from bed to chair. Pt incontinent of stool during transfer. VSS  Ambulation/Gait             General Gait Details: Did not attempt due to incontinence.  Stairs            Wheelchair Mobility    Modified Rankin (Stroke Patients Only)       Balance Overall balance assessment: Needs assistance Sitting-balance support: Bilateral upper extremity supported;Feet supported Sitting balance-Leahy Scale: Poor Sitting  balance - Comments: Pt sat EOB x 6-8 minutes with min to mod assist due to lean to lt Postural control: Left lateral lean Standing balance support: Bilateral upper extremity supported Standing balance-Leahy Scale: Poor Standing balance comment: rollator and min A for static standing                             Pertinent Vitals/Pain Pain Assessment: No/denies pain    Home Living Family/patient expects to be discharged to:: Private residence Living Arrangements: Spouse/significant other (daughters live on each side of her) Available Help at Discharge: Family;Available 24 hours/day Type of Home: House Home Access: Stairs to enter Entrance Stairs-Rails: Left Entrance Stairs-Number of Steps: 4 Home Layout: One level;Laundry or work area in [place]: Environmental consultant - 4 wheels;Cane - single point;Bedside commode;Shower seat      Prior Function Level of Independence: Needs assistance   Gait / Transfers Assistance Needed: amb short distances modified independent with rollator     Comments: Pt sleeps in recliner at home not in the bed.     Hand Dominance   Dominant Hand: Right    Extremity/Trunk Assessment   Upper Extremity Assessment Upper Extremity Assessment: Defer to OT evaluation    Lower Extremity Assessment Lower Extremity Assessment: Generalized weakness    Cervical / Trunk Assessment Cervical / Trunk Assessment: Kyphotic;Other exceptions (scoliosis)  Communication   Communication: HOH  Cognition Arousal/Alertness: Awake/alert Behavior During Therapy: WFL for tasks assessed/performed Overall Cognitive Status: Within Functional Limits for tasks assessed  General Comments      Exercises     Assessment/Plan    PT Assessment Patient needs continued PT services  PT Problem List Decreased strength;Decreased activity tolerance;Decreased balance;Decreased mobility       PT Treatment  Interventions DME instruction;Gait training;Functional mobility training;Therapeutic activities;Therapeutic exercise;Stair training;Balance training;Patient/family education    PT Goals (Current goals can be found in the Care Plan section)  Acute Rehab PT Goals Patient Stated Goal: return home PT Goal Formulation: With patient Time For Goal Achievement: 05/02/17 Potential to Achieve Goals: Fair    Frequency Min 3X/week   Barriers to discharge        Co-evaluation               AM-PAC PT "6 Clicks" Daily Activity  Outcome Measure Difficulty turning over in bed (including adjusting bedclothes, sheets and blankets)?: Total Difficulty moving from lying on back to sitting on the side of the bed? : Total Difficulty sitting down on and standing up from a chair with arms (e.g., wheelchair, bedside commode, etc,.)?: Total Help needed moving to and from a bed to chair (including a wheelchair)?: A Lot Help needed walking in hospital room?: A Lot Help needed climbing 3-5 steps with a railing? : Total 6 Click Score: 8    End of Session   Activity Tolerance: Patient limited by fatigue Patient left: in chair;with call bell/phone within reach;with family/visitor present Nurse Communication: Mobility status PT Visit Diagnosis: Muscle weakness (generalized) (M62.81);Difficulty in walking, not elsewhere classified (R26.2)    Time: 0998-3382 PT Time Calculation (min) (ACUTE ONLY): 37 min   Charges:   PT Evaluation $PT Eval Moderate Complexity: 1 Procedure PT Treatments $Therapeutic Activity: 8-22 mins   PT G Codes:        [facility] [place] 04/25/2017, 4:41 PM

## 2017-04-25 NOTE — Progress Notes (Signed)
[place] Progress Note Patient Name: [name] [name] DOB: [birth year] MRN: [id number]   Date of Service  04/25/2017  HPI/Events of Note  Mg 1.2 on 7/8 -   eICU Interventions  4 gm Mag & recheck levels on 7/11     Intervention Category Intermediate Interventions: Electrolyte abnormality - evaluation and management  ALVA,RAKESH V. 04/25/2017, 2:34 AM

## 2017-04-26 LAB — GLUCOSE, CAPILLARY: GLUCOSE-CAPILLARY: 121 mg/dL — AB (ref 65–99)

## 2017-04-26 MED ORDER — SODIUM CHLORIDE 0.9 % IV SOLN: 250.0000 mL | INTRAVENOUS | Status: DC | PRN

## 2017-04-26 MED ORDER — HYDROCODONE-ACETAMINOPHEN 5-325 MG PO TABS
2.0000 | ORAL_TABLET | Freq: Once | ORAL | Status: DC
Start: 2017-04-26 — End: 2017-04-28

## 2017-04-26 MED ORDER — ACETAMINOPHEN 325 MG PO TABS
650.0000 mg | ORAL_TABLET | ORAL | Status: DC | PRN
  Administered 2017-04-26: 650 mg via ORAL
  Filled 2017-04-26: qty 2

## 2017-04-26 MED ORDER — KETOROLAC TROMETHAMINE 15 MG/ML IJ SOLN
15.0000 mg | Freq: Once | INTRAMUSCULAR | Status: AC
  Administered 2017-04-26: 15 mg via INTRAVENOUS
  Filled 2017-04-26: qty 1

## 2017-04-26 MED ORDER — HYDROCODONE-ACETAMINOPHEN 5-325 MG PO TABS
1.0000 | ORAL_TABLET | ORAL | Status: DC | PRN
  Administered 2017-04-27 (×2): 1 via ORAL
  Filled 2017-04-26 (×2): qty 1

## 2017-04-26 MED ORDER — QUETIAPINE FUMARATE 25 MG PO TABS
12.5000 mg | ORAL_TABLET | Freq: Every day | ORAL | Status: DC
  Administered 2017-04-26 – 2017-04-27 (×2): 12.5 mg via ORAL
  Filled 2017-04-26 (×3): qty 1

## 2017-04-26 MED ORDER — DEXTROSE 5 % IV SOLN
2.0000 g | INTRAVENOUS | Status: DC
  Administered 2017-04-27: 2 g via INTRAVENOUS
  Filled 2017-04-26: qty 2

## 2017-04-26 NOTE — Progress Notes (Signed)
[facility] TEAM 1 - Stepdown/ICU TEAM  James Lafalce Rames  RFF:[id number] DOB: 03-17-1926 DOA: 04/22/2017 PCP: Owens Loffler, MD    Brief Narrative:  83 y.o. F with a Hx of AAA, COPD, HTN, and TIA who was admitted by the critical care team on 04/22/17 with low-grade fever, weakness and confusion. Upon initial presentation, she was hypotensive requiring 3 L of volume resuscitation in the ED. Source of sepsis thought to be from UTI. Urine cultures grew Pseudomonas.   Significant Events: 7/7 admit by PCCM 7/10 [facility] assumed care  Subjective: The pt is resting comfortably in bed.  She denies chest pain shortness of breath fevers or chills.  She states she has a crick in her neck from the way she slept last night.  She is anxious to get home as soon as it is felt to be reasonable to do so.  The patient and her daughter report to me that she is experiencing significant loss of fine motor movement primarily in her right arm and hand but somewhat in her left arm and hand as well.  They both agree this was clearly not present prior to her admission.  She has not experienced similar symptoms previously.  Assessment & Plan:  Severe Sepsis due to Pseudomonas UTI and bacteremia  Clinically much improved - continue antibiotics with plan to transition to oral in a.m.  Acute delirium Resolved - likely due to critical illness  New onset paroxysmal Afib  CHA2DS2 VASc score is 8 - on eliquis - has been in and out of A. fib even just this morning - cont to follow on tele to assure rate controlled - will need to very carefully assess stability on her feet prior to discharging home on anticoagulant - counsel patient regarding increased risk of stroke related to this diagnosis  Neurologic changes bilateral upper extremities right greater than left During the course of the patient's hospitalization she has appreciated loss primarily of fine motor movement in the right arm and hand but to a lesser extent in the left  arm and hand - she denies any other focal neurologic symptoms - differential includes critical care neuropathy/myopathy, cervical spine source, and stroke (due to bilateral extremity distribution unusual for this) - I have offered the patient an evaluation to include MRI of the brain and the cervical spine - she favors no further evaluation and instead to focus on therapy to attempt to rehabilitate   Demand ischemia No plans for further inpt evaluation   Acute kidney injury Due to sepsis - resolved with normalization of renal function  Hypothyroidism  Continue home Synthroid dose  Fe deficiency anemia Hemoglobin stable  Thoracic aorta aneurysm  Severe AoS Progressed since TTE 2014 - to be followed in the outpt setting   Severe protein calorie malnutrition   DVT prophylaxis: Eliquis Code Status: DNR - NO CODE Family Communication: no family present at time of exam  Disposition Plan: transfer to tele bed - PT/OT to cont to follow - transition to oral abx - possible d/c home in ~48hrs (has lots of family help at home)  Consultants:  PCCM Cardiology   Antimicrobials:  cefepime 7/8 > ceftriaxone 7/7 Zosyn 7/7  Objective: Blood pressure 105/63, pulse 82, temperature (!) 97.4 F (36.3 C), temperature source Oral, resp. rate 17, height 5' (1.524 m), weight 47.3 kg (104 lb 4.4 oz), SpO2 93 %.  Intake/Output Summary (Last 24 hours) at 04/26/17 0833 Last data filed at 04/26/17 0803  Gross per 24 hour  Intake              690 ml  Output             1240 ml  Net             -550 ml   Filed Weights   04/24/17 1122 04/25/17 0331 04/26/17 0300  Weight: 49 kg (108 lb 0.4 oz) 47.6 kg (104 lb 15 oz) 47.3 kg (104 lb 4.4 oz)    Examination: General: No acute respiratory distress Lungs: Clear to auscultation bilaterally without wheezes or crackles Cardiovascular: Irregularly irregular with rate control without appreciable murmur gallop or rub Abdomen: Nontender, nondistended, soft,  bowel sounds positive, no rebound, no ascites, no appreciable mass Extremities: No significant cyanosis, clubbing, or edema bilateral lower extremities  CBC:  Recent Labs Lab 04/22/17 0737 04/23/17 0154 04/25/17 0150  WBC 20.2* 17.3* 6.9  NEUTROABS 18.5*  --   --   HGB 10.6* 9.0* 9.7*  HCT 33.3* 27.6* 29.4*  MCV 96.2 94.8 93.6  PLT 181 156 026   Basic Metabolic Panel:  Recent Labs Lab 04/22/17 0737 04/23/17 0154 04/24/17 0158 04/25/17 0150  NA 139 135 137 139  K 3.8 3.6 3.6 3.7  CL 110 111 113* 111  CO2 17* 16* 19* 22  GLUCOSE 81 77 86 83  BUN 31* 24* 14 12  CREATININE 1.37* 1.04* 0.77 0.66  CALCIUM 7.8* 7.3* 7.7* 7.8*  MG  --  1.2*  --   --   PHOS  --  3.1  --   --    GFR: Estimated Creatinine Clearance: 34.9 mL/min (by C-G formula based on SCr of 0.66 mg/dL).  Liver Function Tests:  Recent Labs Lab 04/22/17 0737  AST 22  ALT 9*  ALKPHOS 85  BILITOT 0.6  PROT 5.2*  ALBUMIN 2.3*    Coagulation Profile:  Recent Labs Lab 04/22/17 0737 04/22/17 1411  INR 1.24 1.31    Cardiac Enzymes:  Recent Labs Lab 04/22/17 1411 04/23/17 0154  TROPONINI 0.25* 0.39*    HbA1C: Hgb A1c MFr Bld  Date/Time Value Ref Range Status  05/27/2007 05:10 AM   Final   5.3 (NOTE)   The ADA recommends the following therapeutic goals for glycemic   control related to Hgb A1C measurement:   Goal of Therapy:   < 7.0% Hgb A1C   Action Suggested:  > 8.0% Hgb A1C   Ref:  Diabetes Care, 22, Suppl. 1, 1999    CBG:  Recent Labs Lab 04/24/17 0726 04/24/17 1120 04/24/17 1515 04/24/17 2012 04/25/17 1506  GLUCAP 73 99 144* 106* 140*    Recent Results (from the past 240 hour(s))  Culture, blood (Routine x 2)     Status: Abnormal   Collection Time: 04/22/17  7:50 AM  Result Value Ref Range Status   Specimen Description BLOOD RIGHT FOREARM  Final   Special Requests IN PEDIATRIC BOTTLE Blood Culture adequate volume  Final   Culture  Setup Time   Final    IN PEDIATRIC  BOTTLE GRAM NEGATIVE RODS CRITICAL VALUE NOTED.  VALUE IS CONSISTENT WITH PREVIOUSLY REPORTED AND CALLED VALUE.    Culture PSEUDOMONAS AERUGINOSA (A)  Final   Report Status 04/25/2017 FINAL  Final   Organism ID, Bacteria PSEUDOMONAS AERUGINOSA  Final      Susceptibility   Pseudomonas aeruginosa - MIC*    CEFTAZIDIME 4 SENSITIVE Sensitive     CIPROFLOXACIN <=0.25 SENSITIVE Sensitive     GENTAMICIN <=1 SENSITIVE Sensitive  IMIPENEM 2 SENSITIVE Sensitive     PIP/TAZO 16 SENSITIVE Sensitive     CEFEPIME 4 SENSITIVE Sensitive     * PSEUDOMONAS AERUGINOSA  Culture, blood (Routine x 2)     Status: Abnormal   Collection Time: 04/22/17  7:56 AM  Result Value Ref Range Status   Specimen Description BLOOD LEFT ANTECUBITAL  Final   Special Requests   Final    BOTTLES DRAWN AEROBIC AND ANAEROBIC Blood Culture adequate volume   Culture  Setup Time   Final    AEROBIC BOTTLE ONLY GRAM NEGATIVE RODS CRITICAL RESULT CALLED TO, READ BACK BY AND VERIFIED WITH: TO KCOOK(PHARMd) BY TCLEVELAND 04/23/2017 AT 6:21AM    Culture (A)  Final    PSEUDOMONAS AERUGINOSA SUSCEPTIBILITIES PERFORMED ON PREVIOUS CULTURE WITHIN THE LAST 5 DAYS.    Report Status 04/25/2017 FINAL  Final  Blood Culture ID Panel (Reflexed)     Status: Abnormal   Collection Time: 04/22/17  7:56 AM  Result Value Ref Range Status   Enterococcus species NOT DETECTED NOT DETECTED Final   Listeria monocytogenes NOT DETECTED NOT DETECTED Final   Staphylococcus species NOT DETECTED NOT DETECTED Final   Staphylococcus aureus NOT DETECTED NOT DETECTED Final   Streptococcus species NOT DETECTED NOT DETECTED Final   Streptococcus agalactiae NOT DETECTED NOT DETECTED Final   Streptococcus pneumoniae NOT DETECTED NOT DETECTED Final   Streptococcus pyogenes NOT DETECTED NOT DETECTED Final   Acinetobacter baumannii NOT DETECTED NOT DETECTED Final   Enterobacteriaceae species NOT DETECTED NOT DETECTED Final   Enterobacter cloacae complex NOT  DETECTED NOT DETECTED Final   Escherichia coli NOT DETECTED NOT DETECTED Final   Klebsiella oxytoca NOT DETECTED NOT DETECTED Final   Klebsiella pneumoniae NOT DETECTED NOT DETECTED Final   Proteus species NOT DETECTED NOT DETECTED Final   Serratia marcescens NOT DETECTED NOT DETECTED Final   Carbapenem resistance NOT DETECTED NOT DETECTED Final   Haemophilus influenzae NOT DETECTED NOT DETECTED Final   Neisseria meningitidis NOT DETECTED NOT DETECTED Final   Pseudomonas aeruginosa DETECTED (A) NOT DETECTED Final    Comment: CRITICAL RESULT CALLED TO, READ BACK BY AND VERIFIED WITH: TO KCOOK(PHARMd) BY TCLEVELAND 04/23/2017 AT 6:21AM    Candida albicans NOT DETECTED NOT DETECTED Final   Candida glabrata NOT DETECTED NOT DETECTED Final   Candida krusei NOT DETECTED NOT DETECTED Final   Candida parapsilosis NOT DETECTED NOT DETECTED Final   Candida tropicalis NOT DETECTED NOT DETECTED Final  Urine culture     Status: Abnormal   Collection Time: 04/22/17  8:08 AM  Result Value Ref Range Status   Specimen Description URINE, CATHETERIZED  Final   Special Requests NONE  Final   Culture <10,000 COLONIES/mL INSIGNIFICANT GROWTH (A)  Final   Report Status 04/23/2017 FINAL  Final  MRSA PCR Screening     Status: None   Collection Time: 04/22/17  1:54 PM  Result Value Ref Range Status   MRSA by PCR NEGATIVE NEGATIVE Final    Comment:        The GeneXpert MRSA Assay (FDA approved for NASAL specimens only), is one component of a comprehensive MRSA colonization surveillance program. It is not intended to diagnose MRSA infection nor to guide or monitor treatment for MRSA infections.   Urine culture     Status: Abnormal   Collection Time: 04/22/17  6:20 PM  Result Value Ref Range Status   Specimen Description URINE, CATHETERIZED  Final   Special Requests NONE  Final   Culture  20,000 COLONIES/mL PSEUDOMONAS AERUGINOSA (A)  Final   Report Status 04/25/2017 FINAL  Final   Organism ID,  Bacteria PSEUDOMONAS AERUGINOSA (A)  Final      Susceptibility   Pseudomonas aeruginosa - MIC*    CEFTAZIDIME 4 SENSITIVE Sensitive     CIPROFLOXACIN <=0.25 SENSITIVE Sensitive     GENTAMICIN 2 SENSITIVE Sensitive     IMIPENEM 2 SENSITIVE Sensitive     PIP/TAZO 16 SENSITIVE Sensitive     CEFEPIME 4 SENSITIVE Sensitive     * 20,000 COLONIES/mL PSEUDOMONAS AERUGINOSA     Scheduled Meds: . apixaban  2.5 mg Oral BID  . levothyroxine  25 mcg Oral QAC breakfast  . metoprolol tartrate  12.5 mg Oral BID  . multivitamin with minerals  1 tablet Oral Daily  . oxybutynin  5 mg Oral BID  . QUEtiapine  25 mg Oral QHS     LOS: 4 days   Cherene Altes, MD [facility] Office  [phone] Pager - Text Page per Amion as per below:  On-Call/Text Page:      [email or web address]      password [email or web address]  If 7PM-7AM, please contact night-coverage [email or web address] Password [facility] 04/26/2017, 8:33 AM

## 2017-04-26 NOTE — Progress Notes (Signed)
Physical Therapy Treatment Patient Details Name: [name] [name] MRN: [id number] DOB: [birth year] Today's Date: 04/26/2017    History of Present Illness Pt adm with sepsis due to pseudomonas UTI.  PMH - HTN, CVA, TIA, copd, AAA    PT Comments    Patient agreeable and pleasant during session. Patient tolerated functional task performance and ambulation with assist but limited in distance due to fatigue. Will continue to work on progression of transfers and gait as tolerated. Current POC remains appropriate. Daughter present at bedside and extremely helpful throughout session. Goal is to get patient home so that family can have patient and spouse in same location to provide care.    Follow Up Recommendations  Home health PT;Supervision/Assistance - 24 hour     Equipment Recommendations  None recommended by PT    Recommendations for Other Services       Precautions / Restrictions Precautions Precautions: Fall Restrictions Weight Bearing Restrictions: No    Mobility  Bed Mobility Overal bed mobility: Needs Assistance Bed Mobility: Supine to Sit     Supine to sit: Max assist;HOB elevated     General bed mobility comments: assist for LEs over EOB, to raise trunk and advance hips to EOB with pad  Transfers Overall transfer level: Needs assistance Equipment used: 4-wheeled walker Transfers: Sit to/from Stand Sit to Stand: Mod assist         General transfer comment: assist to rise and gain balance, assist to place R hand on walker handle  Ambulation/Gait Ambulation/Gait assistance: Mod assist;+2 safety/equipment Ambulation Distance (Feet): 50 Feet Assistive device: 4-wheeled walker Gait Pattern/deviations: Trunk flexed;Shuffle;Step-to pattern Gait velocity: decreased Gait velocity interpretation: <1.8 ft/sec, indicative of risk for recurrent falls General Gait Details: patient with extremely flexed posture, widened left LE during gait, heavy reliance on rolator. Poor  clearance of foot bilaterally   Stairs            Wheelchair Mobility    Modified Rankin (Stroke Patients Only)       Balance Overall balance assessment: Needs assistance Sitting-balance support: Bilateral upper extremity supported;Feet supported Sitting balance-Leahy Scale: Poor   Postural control: Left lateral lean Standing balance support: Bilateral upper extremity supported Standing balance-Leahy Scale: Poor Standing balance comment: rollator and min A for static standing                            Cognition Arousal/Alertness: Awake/alert Behavior During Therapy: WFL for tasks assessed/performed Overall Cognitive Status: Within Functional Limits for tasks assessed                                        Exercises      General Comments        Pertinent Vitals/Pain Pain Assessment: No/denies pain    Home Living Family/patient expects to be discharged to:: Private residence Living Arrangements: Spouse/significant other (family provides 24 hour care) Available Help at Discharge: Family;Available 24 hours/day Type of Home: House Home Access: Stairs to enter Entrance Stairs-Rails: Left Home Layout: One level;Laundry or work area in [place]: Environmental consultant - 4 wheels;Cane - single point;Bedside commode;Shower seat      Prior Function Level of Independence: Needs assistance  Gait / Transfers Assistance Needed: amb short distances modified independent with rollator ADL's / Homemaking Assistance Needed: assist for LB dressing and all IADL, pt self feeds, toilets, and bathes  herself Comments: Pt sleeps in recliner at home not in the bed.   PT Goals (current goals can now be found in the care plan section) Acute Rehab PT Goals Patient Stated Goal: return home PT Goal Formulation: With patient Time For Goal Achievement: 05/02/17 Potential to Achieve Goals: Fair Progress towards PT goals: Progressing toward goals     Frequency    Min 3X/week      PT Plan Current plan remains appropriate    Co-evaluation PT/OT/SLP Co-Evaluation/Treatment: Yes Reason for Co-Treatment: For patient/therapist safety;Complexity of the patient's impairments (multi-system involvement) PT goals addressed during session: Mobility/safety with mobility OT goals addressed during session: ADL's and self-care      AM-PAC PT "6 Clicks" Daily Activity  Outcome Measure  Difficulty turning over in bed (including adjusting bedclothes, sheets and blankets)?: Total Difficulty moving from lying on back to sitting on the side of the bed? : Total Difficulty sitting down on and standing up from a chair with arms (e.g., wheelchair, bedside commode, etc,.)?: Total Help needed moving to and from a bed to chair (including a wheelchair)?: A Lot Help needed walking in hospital room?: A Lot Help needed climbing 3-5 steps with a railing? : Total 6 Click Score: 8    End of Session   Activity Tolerance: Patient limited by fatigue Patient left: in chair;with call bell/phone within reach;with family/visitor present Nurse Communication: Mobility status PT Visit Diagnosis: Muscle weakness (generalized) (M62.81);Difficulty in walking, not elsewhere classified (R26.2)     Time: 3888-7579 PT Time Calculation (min) (ACUTE ONLY): 27 min  Charges:  $Gait Training: 8-22 mins                    G Codes:       Alben Deeds, PT DPT NCS [phone]    Duncan Dull 04/26/2017, 5:13 PM

## 2017-04-26 NOTE — Evaluation (Signed)
Occupational Therapy Evaluation Patient Details Name: [name] [name] MRN: [id number] DOB: 04/29/1926 Today's Date: 04/26/2017    History of Present Illness Pt adm with sepsis due to pseudomonas UTI.  PMH - HTN, CVA, TIA, copd, AAA   Clinical Impression   Pt was independent in self care with the exception of LB dressing and walked with a rollator short distances prior to admission. She sleeps in a recliner. Pt presents with generalized weakness, severe scoliosis and kyphosis and impaired sitting and standing balance. She demonstrates poor UE coordination, greater on the R. She requires max to total assist for ADL. Pt has excellent care at home of her children and plans to return there at d/c. Will follow acutely.    Follow Up Recommendations  Home health OT;Supervision/Assistance - 24 hour    Equipment Recommendations  None recommended by OT    Recommendations for Other Services       Precautions / Restrictions Precautions Precautions: Fall Restrictions Weight Bearing Restrictions: No      Mobility Bed Mobility Overal bed mobility: Needs Assistance Bed Mobility: Supine to Sit     Supine to sit: Max assist;HOB elevated     General bed mobility comments: assist for LEs over EOB, to raise trunk and advance hips to EOB with pad  Transfers Overall transfer level: Needs assistance Equipment used: 4-wheeled walker Transfers: Sit to/from Stand Sit to Stand: Mod assist         General transfer comment: assist to rise and gain balance, assist to place R hand on walker handle    Balance Overall balance assessment: Needs assistance Sitting-balance support: Bilateral upper extremity supported;Feet supported Sitting balance-Leahy Scale: Poor   Postural control: Left lateral lean Standing balance support: Bilateral upper extremity supported Standing balance-Leahy Scale: Poor Standing balance comment: rollator and min A for static standing                          ADL either performed or assessed with clinical judgement   ADL                                       Functional mobility during ADLs: Moderate assistance;Rolling walker General ADL Comments: Pt currently requiring max to total assist for all ADL.     Vision Baseline Vision/History: Macular Degeneration;Wears glasses Wears Glasses: Reading only Patient Visual Report: No change from baseline       Perception     Praxis      Pertinent Vitals/Pain Pain Assessment: No/denies pain     Hand Dominance Right   Extremity/Trunk Assessment Upper Extremity Assessment Upper Extremity Assessment: RUE deficits/detail;LUE deficits/detail RUE Deficits / Details: full AROM 2+/5 shoulder and triceps, 4/5 biceps and gross grasp, pt with scoliosis and kyphosis limiting reach RUE Sensation: decreased proprioception (needs further assessment) RUE Coordination: decreased fine motor;decreased gross motor LUE Deficits / Details: full AROM 3+/5 shoulder and triceps, 4/5 biceps and gross grasp LUE Coordination: decreased fine motor;decreased gross motor   Lower Extremity Assessment Lower Extremity Assessment: Defer to PT evaluation   Cervical / Trunk Assessment Cervical / Trunk Assessment: Kyphotic;Other exceptions (scoliosis)   Communication Communication Communication: HOH   Cognition Arousal/Alertness: Awake/alert Behavior During Therapy: WFL for tasks assessed/performed Overall Cognitive Status: Within Functional Limits for tasks assessed  General Comments       Exercises     Shoulder Instructions      Home Living Family/patient expects to be discharged to:: Private residence Living Arrangements: Spouse/significant other (family provides 24 hour care) Available Help at Discharge: Family;Available 24 hours/day Type of Home: House Home Access: Stairs to enter [employer] of Steps: 4 Entrance  Stairs-Rails: Left Home Layout: One level;Laundry or work area in basement     [employer] Shower/Tub: Teacher, early years/pre: Standard (uses BSC over toilet and beside bed)     Home Equipment: Environmental consultant - 4 wheels;Cane - single point;Bedside commode;Shower seat          Prior Functioning/Environment Level of Independence: Needs assistance  Gait / Transfers Assistance Needed: amb short distances modified independent with rollator ADL's / Homemaking Assistance Needed: assist for LB dressing and all IADL, pt self feeds, toilets, and bathes herself   Comments: Pt sleeps in recliner at home not in the bed.        OT Problem List: Decreased strength;Decreased activity tolerance;Impaired balance (sitting and/or standing);Decreased coordination;Decreased knowledge of use of DME or AE;Impaired UE functional use;Impaired sensation      OT Treatment/Interventions: Self-care/ADL training;DME and/or AE instruction;Therapeutic activities;Patient/family education;Balance training;Therapeutic exercise    OT Goals(Current goals can be found in the care plan section) Acute Rehab OT Goals Patient Stated Goal: return home OT Goal Formulation: With patient Time For Goal Achievement: 05/10/17 Potential to Achieve Goals: Good ADL Goals Pt Will Perform Eating: with min assist;sitting;with adaptive utensils Pt Will Perform Grooming: with min assist;sitting;with adaptive equipment Pt Will Perform Upper Body Bathing: with min assist;sitting Pt Will Perform Upper Body Dressing: with min assist;sitting Pt Will Transfer to Toilet: with min assist;ambulating;bedside commode Pt Will Perform Toileting - Clothing Manipulation and hygiene: with min assist;sit to/from stand Pt/caregiver will Perform Home Exercise Program: Increased strength;Both right and left upper extremity;With Supervision  OT Frequency: Min 2X/week   Barriers to D/C:            Co-evaluation PT/OT/SLP Co-Evaluation/Treatment:  Yes     OT goals addressed during session: ADL's and self-care      AM-PAC PT "6 Clicks" Daily Activity     Outcome Measure Help from another person eating meals?: A Lot Help from another person taking care of personal grooming?: Total Help from another person toileting, which includes using toliet, bedpan, or urinal?: Total Help from another person bathing (including washing, rinsing, drying)?: Total Help from another person to put on and taking off regular upper body clothing?: A Lot Help from another person to put on and taking off regular lower body clothing?: Total 6 Click Score: 8   End of Session Equipment Utilized During Treatment: Gait belt;Rolling walker Nurse Communication: Mobility status  Activity Tolerance: Patient limited by fatigue (HR to 123) Patient left: in chair;with call bell/phone within reach;with family/visitor present  OT Visit Diagnosis: Unsteadiness on feet (R26.81);Muscle weakness (generalized) (M62.81);Other abnormalities of gait and mobility (R26.89)                Time: 3474-2595 OT Time Calculation (min): 27 min Charges:  OT General Charges $OT Visit: 1 Procedure OT Evaluation $OT Eval Moderate Complexity: 1 Procedure G-Codes:     Malka So 04/26/2017, 4:36 PM  [phone]

## 2017-04-26 NOTE — Care Management Note (Addendum)
Case Management Note  Patient Details  Name: CATHLINE DOWEN MRN: [id number] Date of Birth: [birth year]  Subjective/Objective:    Pt admitted with septic shock - initially requiring pressors  Action/Plan:  Pt admitted from home - has support of daughters.  PT ordered                   Expected Discharge Date:                  Expected Discharge Plan:   (from home)  In-House Referral:     Discharge planning Services  CM Consult  Post Acute Care Choice:    Choice offered to:  Patient, Adult Children  DME Arranged:    DME Agency:     HH Arranged:  PT, Nurse's Aide [place] Agency:  [facility]  Status of Service:  In process, will continue to follow  If discussed at Long Length of Stay Meetings, dates discussed:    Additional Comments: CM requested [place] orders from attending  Pt oriented with daughter at bedside.  Pt stays in the home with her husband however children rotate to provide 24 care in the home.  PTA pt able to perform most of ADL'S independently - daughters and sons help in whatever ways deemed neccessary.  PTA pt walked with a walker and has bedside commode in the home, sleeps in power recliner.  During this admit pt has had reduced motor functionality in her hands - pt still wants to return home and family will continue to provide all assistance needed.  Daughter is interested in [place] and HHPT for pt - choice given - [facility] chosen - requested orders  , Maryclare Labrador, RN 04/26/2017, 11:26 AM

## 2017-04-27 DIAGNOSIS — I712 Thoracic aortic aneurysm, without rupture: Secondary | ICD-10-CM

## 2017-04-27 DIAGNOSIS — I4891 Unspecified atrial fibrillation: Secondary | ICD-10-CM

## 2017-04-27 DIAGNOSIS — R748 Abnormal levels of other serum enzymes: Secondary | ICD-10-CM

## 2017-04-27 DIAGNOSIS — N179 Acute kidney failure, unspecified: Secondary | ICD-10-CM

## 2017-04-27 DIAGNOSIS — A4152 Sepsis due to Pseudomonas: Principal | ICD-10-CM

## 2017-04-27 DIAGNOSIS — E43 Unspecified severe protein-calorie malnutrition: Secondary | ICD-10-CM

## 2017-04-27 LAB — CBC
HCT: 31.6 % — ABNORMAL LOW (ref 36.0–46.0)
HEMOGLOBIN: 10.1 g/dL — AB (ref 12.0–15.0)
MCH: 29.8 pg (ref 26.0–34.0)
MCHC: 32 g/dL (ref 30.0–36.0)
MCV: 93.2 fL (ref 78.0–100.0)
PLATELETS: 195 10*3/uL (ref 150–400)
RBC: 3.39 MIL/uL — AB (ref 3.87–5.11)
RDW: 14.9 % (ref 11.5–15.5)
WBC: 6.4 10*3/uL (ref 4.0–10.5)

## 2017-04-27 LAB — BASIC METABOLIC PANEL
Anion gap: 6 (ref 5–15)
BUN: 13 mg/dL (ref 6–20)
CALCIUM: 7.7 mg/dL — AB (ref 8.9–10.3)
CO2: 23 mmol/L (ref 22–32)
CREATININE: 0.6 mg/dL (ref 0.44–1.00)
Chloride: 108 mmol/L (ref 101–111)
GFR calc Af Amer: >60 mL/min (ref >60)
GLUCOSE: 77 mg/dL (ref 65–99)
Potassium: 3.7 mmol/L (ref 3.5–5.1)
Sodium: 137 mmol/L (ref 135–145)

## 2017-04-27 LAB — RETICULOCYTES
RBC.: 3.39 MIL/uL — AB (ref 3.87–5.11)
RETIC COUNT ABSOLUTE: 44.1 10*3/uL (ref 19.0–186.0)
RETIC CT PCT: 1.3 % (ref 0.4–3.1)

## 2017-04-27 LAB — IRON AND TIBC
Iron: 44 ug/dL (ref 28–170)
Saturation Ratios: 32 % — ABNORMAL HIGH (ref 10.4–31.8)
TIBC: 139 ug/dL — ABNORMAL LOW (ref 250–450)
UIBC: 95 ug/dL

## 2017-04-27 LAB — FERRITIN: FERRITIN: 296 ng/mL (ref 11–307)

## 2017-04-27 LAB — FOLATE: FOLATE: 27.6 ng/mL (ref >5.9)

## 2017-04-27 LAB — VITAMIN B12: Vitamin B-12: 1038 pg/mL — ABNORMAL HIGH (ref 180–914)

## 2017-04-27 MED ORDER — SODIUM CHLORIDE 0.9 % IV BOLUS (SEPSIS)
500.0000 mL | Freq: Once | INTRAVENOUS | Status: AC
  Administered 2017-04-28: 500 mL via INTRAVENOUS

## 2017-04-27 MED ORDER — CIPROFLOXACIN HCL 500 MG PO TABS
500.0000 mg | ORAL_TABLET | Freq: Two times a day (BID) | ORAL | Status: DC
  Administered 2017-04-27 – 2017-04-28 (×3): 500 mg via ORAL
  Filled 2017-04-27 (×4): qty 1

## 2017-04-27 MED ORDER — MUPIROCIN 2 % EX OINT
TOPICAL_OINTMENT | Freq: Every day | CUTANEOUS | Status: DC
  Administered 2017-04-27: 13:00:00 via TOPICAL
  Administered 2017-04-28: 1 via TOPICAL
  Filled 2017-04-27 (×2): qty 22

## 2017-04-27 NOTE — Progress Notes (Signed)
PROGRESS NOTE  [name] [name]  ZOX:[id number] DOB: May 29, 1926 DOA: 04/22/2017 PCP: Owens Loffler, MD   Brief Narrative: [name] [name] is a 83 y.o. female with a history of AAA having declined surgery, COPD, HTN, and CVA Jan 2018 who presented with fever, weakness, and delirium with urinary frequency. She was hypotensive with evidence of UTI, admitted to ICU 7/7. Cultures of urine and blood grew pseudomonas and the patient has improved with tailored antibiotics. During admission she has had new-onset atrial fibrillation.   Assessment & Plan: Principal Problem:   Pseudomonas sepsis ([place]) Active Problems:   Iron deficiency anemia   Aneurysm of thoracic aorta (HCC)   Elevated troponin   Protein-calorie malnutrition, severe (HCC)   Severe sepsis (HCC)   UTI (urinary tract infection)   Severe aortic stenosis   New onset atrial fibrillation (HCC)   AKI (acute kidney injury) ([place])   Delirium   H/O: CVA (cerebrovascular accident)  Severe sepsis due to Pseudomonas UTI and bacteremia: Improved on tailored antibiotics.  - Transition cefepime IV > ciprofloxacin po 7/12. Effective antibiotics (initially zosyn, then cefepime) started 7/7.  New onset paroxysmal Afib: Precipitated by sepsis. Initial troponin elevation (0.25 > 0.39) thought to be due to demand ischemia. She denies current chest pain. Echo from Jan 2018 (for CVA work up) showed severe aortic stenosis though atria appeared normal size, and new WMA's in RCA territory with diminished LVEF (40-45%).  - Heart rate improved, continue metoprolol 12.5mg  BID (further titration limited by hypotension) - CHA2DS2-VASc Scoreis 8 and though the patient is certainly high fall risk she has not had falls at home. Low-dose eliquis has been started after repeated discussions with pt and family. Pharmacy to provide further education.  - Will arrange outpatient cardiology follow up.  - Check TSH (mildly elevated at last check in Jan 2018)  Neurologic  changes bilateral upper extremities right greater than left: During the course of the patient's hospitalization she has appreciated loss primarily of fine motor movement in the right arm and hand but to a lesser extent in the left arm and hand - she denies any other focal neurologic symptoms. - Suspect an element of critical care neuropathy/myopathy with improving course, though cervical radiculopathy and/or intracranial event are possible.  - Patient declines any imaging as these are very uncomfortable for her - Continue with PT and OT  Troponin elevation: Cardiology evaluated patient 7/8, believes this to be demand ischemia 2/2 septic shock, UTI, severe aortic stenosis, and anemia - No plans for further inpt evaluation   Acute kidney injury: Due to sepsis, resolved.  Hypothyroidism:  - Continue low-dose synthroid, TSH repeat pending.   Acute delirium: Resolved, likely due to critical illness  Fe deficiency anemia: Hemoglobin stable  Thoracic aorta aneurysm:  - Pt previously declined intervention, aortic root stable on last echo.   Severe Aortic stenosis: Progressed since TTE 2014 - Follow up with cardiology as outpatient, doubt is surgical candidate nor that she would desire surgery.   Severe protein calorie malnutrition  - Nutrition consulted, advised high protein snack daily.   Right arm/hand swelling: Had IV fluids running in that arm, nontender.  - Plan to elevate and stop IVF's. If continues or become painful, will get U/S.   DVT prophylaxis: Eliquis Code Status: DNR Family Communication: Daughter at bedside Disposition Plan: If remains stable, DC home with HH-PT/OT/aide 7/13.   Consultants:   PCCM  Cardiology, Dr. Curt Bears  Procedures:   ECHOCARDIOGRAM 10/27/2016 (previous admission) - Left ventricle: There  is akinesis of the basal and mid   inferolateral and inferior walls. The cavity size was normal.   Wall thickness was increased in a pattern of moderate  LVH. There   was moderate concentric hypertrophy. Systolic function was mildly   reduced. The estimated ejection fraction was in the range of 40%   to 45%. Wall motion was normal; there were no regional wall   motion abnormalities. Doppler parameters are consistent with   abnormal left ventricular relaxation (grade 1 diastolic   dysfunction). Doppler parameters are consistent with elevated   ventricular end-diastolic filling pressure. - Aortic valve: Trileaflet; severely thickened, severely calcified   leaflets. Valve mobility was restricted. There was severe   stenosis. There was mild regurgitation. Mean gradient (S): 39 mm   Hg. Valve area (VTI): 0.86 cm^2. Valve area (Vmax): 0.76 cm^2.   Valve area (Vmean): 0.74 cm^2. - Aortic root: The aortic root was dilated measuring 45 mm. - Ascending aorta: The ascending aorta was dilated measuring 50 mm. - Mitral valve: There was trivial regurgitation. - Right ventricle: Systolic function was normal. - Tricuspid valve: There was mild regurgitation. - Pulmonic valve: There was mild regurgitation. - Pulmonary arteries: Systolic pressure was within the normal   range. - Pericardium, extracardiac: There was no pericardial effusion.  Impressions: - Compared to the prior study from 02/13/2013 there are significant   changes:   1. Aortic stenosis is now severe   2. There are new wall motion abnormalities in the RCA territory   3. LVEF has decreased from 55-60% to 40-45%   4. Aortic root remains dilated at 45 mm, ascending aorta is   dilated at 50 mm, previously reported as 73mm.  Subjective: Denies chest pain, dyspnea, palpitations. Eating ok, no fevers.   Objective: Vitals:   04/27/17 0600 04/27/17 0720 04/27/17 0800 04/27/17 1108  BP: (!) 81/68  104/68   Pulse: 81  91   Resp: 18  17   Temp:  97.6 F (36.4 C)  97.9 F (36.6 C)  TempSrc:  Oral  Oral  SpO2: 97%  93%   Weight:      Height:        Intake/Output Summary (Last 24 hours)  at 04/27/17 1327 Last data filed at 04/27/17 0800  Gross per 24 hour  Intake              290 ml  Output             1600 ml  Net            -1310 ml   Filed Weights   04/25/17 0331 04/26/17 0300 04/27/17 0500  Weight: 47.6 kg (104 lb 15 oz) 47.3 kg (104 lb 4.4 oz) 46.8 kg (103 lb 2.8 oz)    Examination: General exam: Thin elderly female in no distress sitting in bedside chair.  Respiratory system: Non-labored breathing room air. Clear to auscultation bilaterally.  Cardiovascular system: Irreg. II/VI early systolic murmur at RUSB radiating to carotids. No JVD, and no pedal edema. Gastrointestinal system: Abdomen soft, non-tender, non-distended, with normoactive bowel sounds. No organomegaly or masses felt. Neuro: Alert and oriented. Right hand with abnormal fine motor skills, grossly intact strength and no numbness on exam.  Extremities: Warm, no deformities. Right hand edematous nontender.  Skin: Senile purpura. Right elbow with skin avulsion hemostatic without purulence.  Psychiatry: Judgement and insight appear normal. Mood & affect appropriate.   Data Reviewed: I have personally reviewed following labs and imaging studies  CBC:  Recent Labs Lab 04/22/17 0737 04/23/17 0154 04/25/17 0150 04/27/17 0209  WBC 20.2* 17.3* 6.9 6.4  NEUTROABS 18.5*  --   --   --   HGB 10.6* 9.0* 9.7* 10.1*  HCT 33.3* 27.6* 29.4* 31.6*  MCV 96.2 94.8 93.6 93.2  PLT 181 156 165 009   Basic Metabolic Panel:  Recent Labs Lab 04/22/17 0737 04/23/17 0154 04/24/17 0158 04/25/17 0150 04/27/17 0209  NA 139 135 137 139 137  K 3.8 3.6 3.6 3.7 3.7  CL 110 111 113* 111 108  CO2 17* 16* 19* 22 23  GLUCOSE 81 77 86 83 77  BUN 31* 24* 14 12 13   CREATININE 1.37* 1.04* 0.77 0.66 0.60  CALCIUM 7.8* 7.3* 7.7* 7.8* 7.7*  MG  --  1.2*  --   --   --   PHOS  --  3.1  --   --   --    GFR: Estimated Creatinine Clearance: 34.9 mL/min (by C-G formula based on SCr of 0.6 mg/dL). Liver Function  Tests:  Recent Labs Lab 04/22/17 0737  AST 22  ALT 9*  ALKPHOS 85  BILITOT 0.6  PROT 5.2*  ALBUMIN 2.3*   No results for input(s): LIPASE, AMYLASE in the last 168 hours. No results for input(s): AMMONIA in the last 168 hours. Coagulation Profile:  Recent Labs Lab 04/22/17 0737 04/22/17 1411  INR 1.24 1.31   Cardiac Enzymes:  Recent Labs Lab 04/22/17 1411 04/23/17 0154  TROPONINI 0.25* 0.39*   BNP (last 3 results) No results for input(s): PROBNP in the last 8760 hours. HbA1C: No results for input(s): HGBA1C in the last 72 hours. CBG:  Recent Labs Lab 04/24/17 1120 04/24/17 1515 04/24/17 2012 04/25/17 1506 04/26/17 1123  GLUCAP 99 144* 106* 140* 121*   Lipid Profile: No results for input(s): CHOL, HDL, LDLCALC, TRIG, CHOLHDL, LDLDIRECT in the last 72 hours. Thyroid Function Tests: No results for input(s): TSH, T4TOTAL, FREET4, T3FREE, THYROIDAB in the last 72 hours. Anemia Panel:  Recent Labs  04/27/17 0209  VITAMINB12 1,038*  FOLATE 27.6  FERRITIN 296  TIBC 139*  IRON 44  RETICCTPCT 1.3   Urine analysis:    Component Value Date/Time   COLORURINE AMBER (A) 04/22/2017 1850   APPEARANCEUR HAZY (A) 04/22/2017 1850   LABSPEC 1.014 04/22/2017 1850   PHURINE 5.0 04/22/2017 1850   GLUCOSEU NEGATIVE 04/22/2017 1850   HGBUR SMALL (A) 04/22/2017 1850   BILIRUBINUR NEGATIVE 04/22/2017 1850   BILIRUBINUR negative 11/22/2016 1435   KETONESUR NEGATIVE 04/22/2017 1850   PROTEINUR NEGATIVE 04/22/2017 1850   UROBILINOGEN 0.2 11/22/2016 1435   NITRITE NEGATIVE 04/22/2017 1850   LEUKOCYTESUR LARGE (A) 04/22/2017 1850   Recent Results (from the past 240 hour(s))  Culture, blood (Routine x 2)     Status: Abnormal   Collection Time: 04/22/17  7:50 AM  Result Value Ref Range Status   Specimen Description BLOOD RIGHT FOREARM  Final   Special Requests IN PEDIATRIC BOTTLE Blood Culture adequate volume  Final   Culture  Setup Time   Final    IN PEDIATRIC BOTTLE  GRAM NEGATIVE RODS CRITICAL VALUE NOTED.  VALUE IS CONSISTENT WITH PREVIOUSLY REPORTED AND CALLED VALUE.    Culture PSEUDOMONAS AERUGINOSA (A)  Final   Report Status 04/25/2017 FINAL  Final   Organism ID, Bacteria PSEUDOMONAS AERUGINOSA  Final      Susceptibility   Pseudomonas aeruginosa - MIC*    CEFTAZIDIME 4 SENSITIVE Sensitive     CIPROFLOXACIN <=  0.25 SENSITIVE Sensitive     GENTAMICIN <=1 SENSITIVE Sensitive     IMIPENEM 2 SENSITIVE Sensitive     PIP/TAZO 16 SENSITIVE Sensitive     CEFEPIME 4 SENSITIVE Sensitive     * PSEUDOMONAS AERUGINOSA  Culture, blood (Routine x 2)     Status: Abnormal   Collection Time: 04/22/17  7:56 AM  Result Value Ref Range Status   Specimen Description BLOOD LEFT ANTECUBITAL  Final   Special Requests   Final    BOTTLES DRAWN AEROBIC AND ANAEROBIC Blood Culture adequate volume   Culture  Setup Time   Final    AEROBIC BOTTLE ONLY GRAM NEGATIVE RODS CRITICAL RESULT CALLED TO, READ BACK BY AND VERIFIED WITH: TO KCOOK(PHARMd) BY TCLEVELAND 04/23/2017 AT 6:21AM    Culture (A)  Final    PSEUDOMONAS AERUGINOSA SUSCEPTIBILITIES PERFORMED ON PREVIOUS CULTURE WITHIN THE LAST 5 DAYS.    Report Status 04/25/2017 FINAL  Final  Blood Culture ID Panel (Reflexed)     Status: Abnormal   Collection Time: 04/22/17  7:56 AM  Result Value Ref Range Status   Enterococcus species NOT DETECTED NOT DETECTED Final   Listeria monocytogenes NOT DETECTED NOT DETECTED Final   Staphylococcus species NOT DETECTED NOT DETECTED Final   Staphylococcus aureus NOT DETECTED NOT DETECTED Final   Streptococcus species NOT DETECTED NOT DETECTED Final   Streptococcus agalactiae NOT DETECTED NOT DETECTED Final   Streptococcus pneumoniae NOT DETECTED NOT DETECTED Final   Streptococcus pyogenes NOT DETECTED NOT DETECTED Final   Acinetobacter baumannii NOT DETECTED NOT DETECTED Final   Enterobacteriaceae species NOT DETECTED NOT DETECTED Final   Enterobacter cloacae complex NOT DETECTED  NOT DETECTED Final   Escherichia coli NOT DETECTED NOT DETECTED Final   Klebsiella oxytoca NOT DETECTED NOT DETECTED Final   Klebsiella pneumoniae NOT DETECTED NOT DETECTED Final   Proteus species NOT DETECTED NOT DETECTED Final   Serratia marcescens NOT DETECTED NOT DETECTED Final   Carbapenem resistance NOT DETECTED NOT DETECTED Final   Haemophilus influenzae NOT DETECTED NOT DETECTED Final   Neisseria meningitidis NOT DETECTED NOT DETECTED Final   Pseudomonas aeruginosa DETECTED (A) NOT DETECTED Final    Comment: CRITICAL RESULT CALLED TO, READ BACK BY AND VERIFIED WITH: TO KCOOK(PHARMd) BY TCLEVELAND 04/23/2017 AT 6:21AM    Candida albicans NOT DETECTED NOT DETECTED Final   Candida glabrata NOT DETECTED NOT DETECTED Final   Candida krusei NOT DETECTED NOT DETECTED Final   Candida parapsilosis NOT DETECTED NOT DETECTED Final   Candida tropicalis NOT DETECTED NOT DETECTED Final  Urine culture     Status: Abnormal   Collection Time: 04/22/17  8:08 AM  Result Value Ref Range Status   Specimen Description URINE, CATHETERIZED  Final   Special Requests NONE  Final   Culture <10,000 COLONIES/mL INSIGNIFICANT GROWTH (A)  Final   Report Status 04/23/2017 FINAL  Final  MRSA PCR Screening     Status: None   Collection Time: 04/22/17  1:54 PM  Result Value Ref Range Status   MRSA by PCR NEGATIVE NEGATIVE Final    Comment:        The GeneXpert MRSA Assay (FDA approved for NASAL specimens only), is one component of a comprehensive MRSA colonization surveillance program. It is not intended to diagnose MRSA infection nor to guide or monitor treatment for MRSA infections.   Urine culture     Status: Abnormal   Collection Time: 04/22/17  6:20 PM  Result Value Ref Range Status   Specimen  Description URINE, CATHETERIZED  Final   Special Requests NONE  Final   Culture 20,000 COLONIES/mL PSEUDOMONAS AERUGINOSA (A)  Final   Report Status 04/25/2017 FINAL  Final   Organism ID, Bacteria  PSEUDOMONAS AERUGINOSA (A)  Final      Susceptibility   Pseudomonas aeruginosa - MIC*    CEFTAZIDIME 4 SENSITIVE Sensitive     CIPROFLOXACIN <=0.25 SENSITIVE Sensitive     GENTAMICIN 2 SENSITIVE Sensitive     IMIPENEM 2 SENSITIVE Sensitive     PIP/TAZO 16 SENSITIVE Sensitive     CEFEPIME 4 SENSITIVE Sensitive     * 20,000 COLONIES/mL PSEUDOMONAS AERUGINOSA      Time spent: 35 minutes.  Vance Gather, MD [facility] Pager [phone]  If 7PM-7AM, please contact night-coverage [email or web address] Password [email or web address] 04/27/2017, 1:27 PM

## 2017-04-27 NOTE — Care Management Note (Signed)
Case Management Note Original note from:  Maryclare Labrador, RN 04/26/2017, 11:26 AM  Patient Details  Name: [name] [name] MRN: [id number] Date of Birth: Aug 10, 1926  Subjective/Objective:    Pt admitted with septic shock - initially requiring pressors  Action/Plan:  Pt admitted from home - has support of daughters.  PT ordered                   Expected Discharge Date:                  Expected Discharge Plan:  [place] (from home)  In-House Referral:     Discharge planning Services  CM Consult  Post Acute Care Choice:    Choice offered to:  Patient, Adult Children  DME Arranged:    DME Agency:     HH Arranged:  PT, Nurse's Aide, OT [place] Agency:  [facility]  Status of Service:  In process, will continue to follow  If discussed at Long Length of Stay Meetings, dates discussed:    Additional Comments: CM requested [place] orders from attending  Pt oriented with daughter at bedside.  Pt stays in the home with her husband however children rotate to provide 24 care in the home.  PTA pt able to perform most of ADL'S independently - daughters and sons help in whatever ways deemed neccessary.  PTA pt walked with a walker and has bedside commode in the home, sleeps in power recliner.  During this admit pt has had reduced motor functionality in her hands - pt still wants to return home and family will continue to provide all assistance needed.  Daughter is interested in [place] and HHPT for pt - choice given - [facility] chosen - requested orders.   04/27/17 J. Jeriel Vivanco, RN, BSN [place] orders received from MD; referral to [facility] for [facility] follow up.  Start of care 24-48h post dc date.     Reinaldo Raddle, RN, BSN  Trauma/Neuro ICU Case Manager [phone]

## 2017-04-27 NOTE — Progress Notes (Signed)
Physical Therapy Treatment Patient Details Name: [name] [name] MRN: [id number] DOB: 1925-11-24 Today's Date: 04/27/2017    History of Present Illness Pt adm with sepsis due to pseudomonas UTI.  PMH - HTN, CVA, TIA, copd, AAA, severe scoliosis    PT Comments    Pt making slow, steady progress. Very supportive family.   Follow Up Recommendations  Home health PT;Supervision/Assistance - 24 hour;Other (comment) ([place] aide)     Equipment Recommendations  None recommended by PT    Recommendations for Other Services       Precautions / Restrictions Precautions Precautions: Fall Restrictions Weight Bearing Restrictions: No    Mobility  Bed Mobility Overal bed mobility: Needs Assistance Bed Mobility: Supine to Sit     Supine to sit: Max assist;HOB elevated     General bed mobility comments: Assist to bring legs off of bed, elevate trunk into sitting, and bring hips to EOB  Transfers Overall transfer level: Needs assistance Equipment used: 4-wheeled walker Transfers: Sit to/from Stand Sit to Stand: Mod assist         General transfer comment: Assist to bring hips up and for balance. Assist with good placement of hands on rollator.  Ambulation/Gait Ambulation/Gait assistance: +2 safety/equipment;Min assist Ambulation Distance (Feet): 40 Feet (40' x 1, 20' x 1) Assistive device: 4-wheeled walker Gait Pattern/deviations: Trunk flexed;Shuffle;Step-to pattern Gait velocity: decreased Gait velocity interpretation: <1.8 ft/sec, indicative of risk for recurrent falls General Gait Details: Pt with extreme trunk flexion resulting in trunk being almost parallel to floor. Assist for balance and support. Pt with left foot almost hitting wheel of rollator on left. Assist to keep rollator slightly more left to prevent this. Pt with HR to 160's with amb. Returned to 110's with sitting rest. Nursing aware.   Stairs            Wheelchair Mobility    Modified Rankin (Stroke  Patients Only)       Balance Overall balance assessment: Needs assistance Sitting-balance support: Bilateral upper extremity supported;Feet supported Sitting balance-Leahy Scale: Poor Sitting balance - Comments: Pt sat EOB x 3-4 minutes with UE support with supervision Postural control: Left lateral lean Standing balance support: Bilateral upper extremity supported Standing balance-Leahy Scale: Poor Standing balance comment: rollator and min A for static standing                            Cognition Arousal/Alertness: Awake/alert Behavior During Therapy: WFL for tasks assessed/performed Overall Cognitive Status: Within Functional Limits for tasks assessed                                        Exercises      General Comments        Pertinent Vitals/Pain Pain Assessment: Faces Faces Pain Scale: Hurts a little bit Pain Location: back and neck Pain Descriptors / Indicators: Guarding Pain Intervention(s): Limited activity within patient's tolerance;Monitored during session;Repositioned    Home Living                      Prior Function            PT Goals (current goals can now be found in the care plan section) Progress towards PT goals: Progressing toward goals    Frequency    Min 3X/week      PT Plan Current plan  remains appropriate    Co-evaluation              AM-PAC PT "6 Clicks" Daily Activity  Outcome Measure  Difficulty turning over in bed (including adjusting bedclothes, sheets and blankets)?: Total Difficulty moving from lying on back to sitting on the side of the bed? : Total Difficulty sitting down on and standing up from a chair with arms (e.g., wheelchair, bedside commode, etc,.)?: Total Help needed moving to and from a bed to chair (including a wheelchair)?: A Lot Help needed walking in hospital room?: A Lot Help needed climbing 3-5 steps with a railing? : Total 6 Click Score: 8    End of  Session   Activity Tolerance: Patient limited by fatigue Patient left: in chair;with call bell/phone within reach;with chair alarm set;with family/visitor present Nurse Communication: Mobility status;Other (comment) (high HR) PT Visit Diagnosis: Muscle weakness (generalized) (M62.81);Difficulty in walking, not elsewhere classified (R26.2)     Time: 6237-6283 PT Time Calculation (min) (ACUTE ONLY): 12 min  Charges:  $Gait Training: 8-22 mins                    G Codes:       [facility] PT [place] 04/27/2017, 11:19 AM

## 2017-04-28 LAB — BASIC METABOLIC PANEL
ANION GAP: 5 (ref 5–15)
BUN: 12 mg/dL (ref 6–20)
CO2: 23 mmol/L (ref 22–32)
Calcium: 7.9 mg/dL — ABNORMAL LOW (ref 8.9–10.3)
Chloride: 108 mmol/L (ref 101–111)
Creatinine, Ser: 0.61 mg/dL (ref 0.44–1.00)
GFR calc Af Amer: >60 mL/min (ref >60)
Glucose, Bld: 89 mg/dL (ref 65–99)
POTASSIUM: 3.9 mmol/L (ref 3.5–5.1)
SODIUM: 136 mmol/L (ref 135–145)

## 2017-04-28 LAB — TSH: TSH: 7.91 u[IU]/mL — AB (ref 0.350–4.500)

## 2017-04-28 LAB — T4, FREE: FREE T4: 1.03 ng/dL (ref 0.61–1.12)

## 2017-04-28 MED ORDER — LEVOTHYROXINE SODIUM 50 MCG PO TABS
50.0000 ug | ORAL_TABLET | Freq: Every day | ORAL | Status: DC
  Administered 2017-04-28: 50 ug via ORAL

## 2017-04-28 MED ORDER — APIXABAN 2.5 MG PO TABS
2.5000 mg | ORAL_TABLET | Freq: Two times a day (BID) | ORAL | 0 refills | Status: DC
Start: 2017-04-28 — End: 2017-05-15

## 2017-04-28 MED ORDER — CIPROFLOXACIN HCL 500 MG PO TABS: 500.0000 mg | ORAL_TABLET | ORAL | 0 refills | Status: DC

## 2017-04-28 MED ORDER — SODIUM CHLORIDE 0.9 % IV BOLUS (SEPSIS)
500.0000 mL | Freq: Once | INTRAVENOUS | Status: AC
  Administered 2017-04-28: 500 mL via INTRAVENOUS

## 2017-04-28 MED ORDER — CIPROFLOXACIN HCL 500 MG PO TABS: 500.0000 mg | ORAL_TABLET | ORAL | Status: DC

## 2017-04-28 NOTE — Progress Notes (Signed)
Occupational Therapy Treatment Patient Details Name: [name] [name] MRN: [id number] DOB: May 15, 1926 Today's Date: 04/28/2017    History of present illness Pt adm with sepsis due to pseudomonas UTI.  PMH - HTN, CVA, TIA, copd, AAA, severe scoliosis   OT comments  Pt continues to demonstrate significant UE weakness and incoordination, R worse than L. Educated pt and daughter in HEP and use of adaptive cup and foam build ups for utensils. Pt continues to demonstrate decreased activity tolerance with HR to 130 with ambulation. Requires mod assist to stand depending on height of the surface. Daughter plans to take pt home and provide 24 hour care as prior to admission. Will continue to follow.  Follow Up Recommendations  Home health OT;Supervision/Assistance - 24 hour    Equipment Recommendations  None recommended by OT    Recommendations for Other Services      Precautions / Restrictions Precautions Precautions: Fall Restrictions Weight Bearing Restrictions: No       Mobility Bed Mobility Overal bed mobility: Needs Assistance Bed Mobility: Supine to Sit     Supine to sit: Mod assist     General bed mobility comments: cues for technique, assist to raise trunk and advance hips to EOB with bed pad  Transfers Overall transfer level: Needs assistance Equipment used: 4-wheeled walker Transfers: Sit to/from [facility] Sit to Stand: Mod assist;+2 safety/equipment;+2 physical assistance Stand pivot transfers: Mod assist       General transfer comment: 2 person assist to rise from low BSC, one person from bed, pulls up on rollator, able to place hands on walker handles without assist today    Balance   Sitting-balance support: Bilateral upper extremity supported;Feet supported Sitting balance-Leahy Scale: Poor Sitting balance - Comments: L lean     Standing balance-Leahy Scale: Poor                             ADL either performed or assessed  with clinical judgement   ADL Overall ADL's : Needs assistance/impaired Eating/Feeding: Maximal assistance;Sitting Eating/Feeding Details (indicate cue type and reason): issued 2 handled cup with spouse and educated in use. Pt not able to tip up, but useful with straw although pt does not prefer a straw. Educated and issued foam build ups for utensils. Pt can finger feed some. Grooming: Brushing hair;Sitting;Total assistance           Upper Body Dressing : Maximal assistance;Sitting       Toilet Transfer: Moderate assistance;Stand-pivot;BSC Toilet Transfer Details (indicate cue type and reason): pt with urinary incontinence Toileting- Clothing Manipulation and Hygiene: Total assistance;Sit to/from stand       Functional mobility during ADLs: Moderate assistance;Rolling walker General ADL Comments: Pt and daughter educated in [place] shoulders and theraputty activities.     Vision       Perception     Praxis      Cognition Arousal/Alertness: Awake/alert Behavior During Therapy: WFL for tasks assessed/performed Overall Cognitive Status: Within Functional Limits for tasks assessed                                          Exercises     Shoulder Instructions       General Comments      Pertinent Vitals/ Pain       Pain Assessment: Faces Faces Pain Scale:  Hurts a little bit Pain Location: back Pain Descriptors / Indicators: Discomfort Pain Intervention(s): Monitored during session;Repositioned  Home Living                                          Prior Functioning/Environment              Frequency  Min 2X/week        Progress Toward Goals  OT Goals(current goals can now be found in the care plan section)  Progress towards OT goals: Progressing toward goals  Acute Rehab OT Goals Patient Stated Goal: return home OT Goal Formulation: With patient Time For Goal Achievement: 05/10/17 Potential to Achieve Goals:  Good  Plan Discharge plan remains appropriate    Co-evaluation    PT/OT/SLP Co-Evaluation/Treatment: Yes Reason for Co-Treatment: For patient/therapist safety   OT goals addressed during session: Strengthening/ROM      AM-PAC PT "6 Clicks" Daily Activity     Outcome Measure   Help from another person eating meals?: A Lot Help from another person taking care of personal grooming?: Total Help from another person toileting, which includes using toliet, bedpan, or urinal?: Total Help from another person bathing (including washing, rinsing, drying)?: Total Help from another person to put on and taking off regular upper body clothing?: A Lot Help from another person to put on and taking off regular lower body clothing?: Total 6 Click Score: 8    End of Session Equipment Utilized During Treatment: Gait belt;Rolling walker  OT Visit Diagnosis: Unsteadiness on feet (R26.81);Muscle weakness (generalized) (M62.81);Other abnormalities of gait and mobility (R26.89)   Activity Tolerance Treatment limited secondary to medical complications (Comment) (HR to 130s with ambulation)   Patient Left in chair;with call bell/phone within reach;with family/visitor present   Nurse Communication Other (comment) (HR with ambulation)        Time: 4098-1191 OT Time Calculation (min): 51 min  Charges: OT General Charges $OT Visit: 1 Procedure OT Treatments $Self Care/Home Management : 8-22 mins $Therapeutic Activity: 8-22 mins   Malka So 04/28/2017, 1:20 PM [phone]

## 2017-04-28 NOTE — Progress Notes (Signed)
Patient received from 2 AM. Patient is alert and oriented x4. patient has low blood pressure 74/38, pulse 69.  Notified the on call DR then given normal saline 500 ml bolus as sn order. Patient has increased BP 98/55 after bolus.

## 2017-04-28 NOTE — Progress Notes (Signed)
PHARMACY NOTE:  ANTIMICROBIAL RENAL DOSAGE ADJUSTMENT  Current antimicrobial regimen includes a mismatch between antimicrobial dosage and estimated renal function.  As per policy approved by the Pharmacy & Therapeutics and Medical Executive Committees, the antimicrobial dosage will be adjusted accordingly.  Current antimicrobial dosage:  Cipro 500mg  po BID  Indication: UTI  Renal Function:    Estimated Creatinine Clearance: 34.9 mL/min (by C-G formula based on SCr of 0.61 mg/dL). []      On intermittent HD, scheduled: []      On CRRT    Antimicrobial dosage has been changed to:  Cipro 500mg  po q24  Additional comments:  Calculated CrCl 25-32ml/min   Thank you for allowing pharmacy to be a part of this patient's care.    Lewie Chamber., PharmD Clinical Pharmacist [facility]

## 2017-04-28 NOTE — Progress Notes (Signed)
[name] [name] to be D/C'd Home per MD order.  Discussed with the patient and all questions fully answered.  VSS, Skin clean, dry and intact without evidence of skin break down, no evidence of skin tears noted. IV catheter discontinued intact. Site without signs and symptoms of complications. Dressing and pressure applied.  An After Visit Summary was printed and given to the patient. Patient received prescription.  D/c education completed with patient/family including follow up instructions, medication list, d/c activities limitations if indicated, with other d/c instructions as indicated by MD - patient able to verbalize understanding, all questions fully answered.   Patient instructed to return to ED, call 911, or call MD for any changes in condition.   Patient escorted via [place], and D/C home via private auto. Allergies as of 04/28/2017   No Known Allergies     Medication List    STOP taking these medications   clopidogrel 75 MG tablet Commonly known as:  PLAVIX     TAKE these medications   ANACIN 400-32 MG Tabs Generic drug:  Aspirin-Caffeine Take 2 tablets by mouth daily as needed (eadache).   apixaban 2.5 MG Tabs tablet Commonly known as:  ELIQUIS Take 1 tablet (2.5 mg total) by mouth 2 (two) times daily.   atorvastatin 10 MG tablet Commonly known as:  LIPITOR Take 1 tablet (10 mg total) by mouth daily.   ciprofloxacin 500 MG tablet Commonly known as:  CIPRO Take 1 tablet (500 mg total) by mouth daily.   cyanocobalamin 1000 MCG/ML injection Commonly known as:  (VITAMIN B-12) INJECT 1 ML INTERMUSCLUARLLY ONCE A MONTH. What changed:  how much to take  how to take this  when to take this  additional instructions   levothyroxine 25 MCG tablet Commonly known as:  SYNTHROID, LEVOTHROID Take 1 tablet (25 mcg total) by mouth daily before breakfast.   metoprolol tartrate 25 MG tablet Commonly known as:  LOPRESSOR Take 0.5 tablets (12.5 mg total) by mouth 2 (two)  times daily.   multivitamin with minerals Tabs tablet Take 1 tablet by mouth daily.   nystatin cream Commonly known as:  MYCOSTATIN APPLY 1 APPLICATION TOPICALLY 2 (TWO) TIMES DAILY What changed:  when to take this  reasons to take this   oxybutynin 5 MG tablet Commonly known as:  DITROPAN TAKE 1 TABLET TWICE DAILY   PRESERVISION AREDS 2 Caps Take 1 capsule by mouth 2 (two) times daily.      [name] [name] 04/28/2017 5:41 PM

## 2017-04-28 NOTE — Progress Notes (Signed)
Nutrition Follow-up  DOCUMENTATION CODES:   Severe malnutrition in context of chronic illness  INTERVENTION:   -Continue HS snack daily -Continue MVI daily  NUTRITION DIAGNOSIS:   Malnutrition (severe) related to chronic illness (COPD, CHF) as evidenced by severe depletion of body fat, severe depletion of muscle mass.  Ongoing  GOAL:   Patient will meet greater than or equal to 90% of their needs  Progressing  MONITOR:   PO intake, Skin, I & O's  REASON FOR ASSESSMENT:   Malnutrition Screening Tool    ASSESSMENT:   83 yo female with PMH of HTN, diverticular disease, COPD, B12 deficiency, PUD, skin CA, stroke, CHF, malnutrition who was admitted on 7/7 with weakness, found to have pseudomonas bacteremia.  Pt working with therapies at time of visit.   Pt's intake has improved since last visit. Noted meal completion 35-100%. Pt avoids dairy products at baseline and has refused supplements in the past. HS snack is being provided daily.   Labs reviewed: CBGS: 121-140.   Diet Order:  Diet regular Room service appropriate? Yes; Fluid consistency: Thin  Skin:   (skin tear to right arm)  Last BM:  04/27/17  Height:   Ht Readings from Last 1 Encounters:  04/22/17 5' (1.524 m)    Weight:   Wt Readings from Last 1 Encounters:  04/28/17 111 lb 12.4 oz (50.7 kg)    Ideal Body Weight:  45.5 kg  BMI:  Body mass index is 21.83 kg/m.  Estimated Nutritional Needs:   Kcal:  1200-1400  Protein:  60-70 gm  Fluid:  1.4 L  EDUCATION NEEDS:   No education needs identified at this time  Makenize Messman A. Jimmye Norman, RD, LDN, CDE Pager: [phone] After hours Pager: [phone]

## 2017-04-28 NOTE — Care Management Important Message (Signed)
Important Message  Patient Details  Name: [name] [name] MRN: [id number] Date of Birth: 1925-12-29   Medicare Important Message Given:  Yes    Ianmichael Amescua 04/28/2017, 2:05 PM

## 2017-04-28 NOTE — Discharge Summary (Signed)
Physician Discharge Summary  [name] [name] WUJ:[id number] DOB: 1926-01-25 DOA: 04/22/2017  PCP: Owens Loffler, MD  Admit date: 04/22/2017 Discharge date: 04/28/2017  Admitted From: Home Disposition: Home   Recommendations for Outpatient Follow-up:  1. Follow up with PCP in 1-2 weeks 2. Monitor heart rate/rhythm. Continue low dose beta blocker (limited by hypotension) and started eliquis for new onset AFib.  3. Please obtain BMP/CBC in one week 4. Please follow up on the following pending results: Thyroid studies, free T3 and free T4. TSH was mildly elevated, consider increasing synthroid dosage.  5. Monitor right hand/arm fine motor function. This was impaired and improving at time of discharge. Patient declined further work up and/or imaging.   Home Health: OT, PT, aide Equipment/Devices: None Discharge Condition: Stable CODE STATUS: DNR Diet recommendation: Heart healthy  Brief/Interim Summary: [name] [name] is a 83 y.o. female with a history of AAA having declined surgery, COPD, HTN, and CVA Jan 2018 who presented with fever, weakness, and delirium with urinary frequency. She was hypotensive with evidence of UTI, admitted to ICU 7/7. Cultures of urine and blood grew pseudomonas and the patient has improved with tailored antibiotics. During admission she has had new-onset atrial fibrillation. With tailored antibiotics she improved.   Discharge Diagnoses:  Principal Problem:   Pseudomonas sepsis ([place]) Active Problems:   Iron deficiency anemia   Aneurysm of thoracic aorta (HCC)   Elevated troponin   Protein-calorie malnutrition, severe (HCC)   Severe sepsis (HCC)   UTI (urinary tract infection)   Severe aortic stenosis   New onset atrial fibrillation (HCC)   AKI (acute kidney injury) ([place])   Delirium   H/O: CVA (cerebrovascular accident)  Severe sepsis due to Pseudomonas UTI and bacteremia: Improved on tailored antibiotics.  - Transitioned cefepime IV > ciprofloxacin po  7/12. Effective antibiotics (initially zosyn, then cefepime) started 7/7. After discussion with Dr. Megan Salon, ID, will finish 10-day course of effective antibiotics.   New onset paroxysmal Afib: Precipitated by sepsis. Initial troponin elevation (0.25 > 0.39) thought to be due to demand ischemia. She denies current chest pain. Echo from Jan 2018 (for CVA work up) showed severe aortic stenosis though atria appeared normal size, and new WMA's in RCA territory with diminished LVEF (40-45%).  - Continue metoprolol 12.5mg  BID (further titration limited by hypotension) - CHA2DS2-VASc Scoreis 8 and though the patient is certainly high fall risk she has not had falls at home. Low-dose eliquis has been started after repeated discussions with pt and family. Pharmacy provided education around this medication. - Will arrange outpatient cardiology follow up.   Neurologic changes bilateral upper extremities right greater than left: During the course of the patient's hospitalization she has appreciated loss primarily of fine motor movement in the right arm and hand but to a lesser extent in the left arm and hand. No other focal neurologic symptoms. Has history of CVA in Jan, plavix changed to eliquis as above. Suspect an element of critical care neuropathy/myopathy with improving course, though cervical radiculopathy and/or intracranial event are possible.  - Patient declines any imaging as these are very uncomfortable for her - Continue with PT and OT  Troponin elevation: Cardiology evaluated patient 7/8, believes this to be demand ischemia 2/2 septic shock, UTI, severe aortic stenosis, and anemia - No plans for further inpt evaluation   Acute kidney injury: Due to sepsis, resolved.  Hypothyroidism: TSH mildly elevated at 7.910.  - Check free T3 and free T4.  - Plan to continue synthroid 1mcg  and have her follow up with PCP for recheck.  Acute delirium: Resolved, likely due to critical illness  Fe  deficiency anemia: Hemoglobin stable  Thoracic aorta aneurysm:  - Pt previously declined intervention, aortic root stable on last echo.   Severe aortic stenosis: Progressed since TTE 2014 - Follow up with cardiology as outpatient, doubt is surgical candidate nor that she would desire surgery.   Severe protein calorie malnutrition  - Nutrition consulted, advised high protein snack daily.   Right arm/hand swelling: Had IV fluids running in that arm, nontender. Improved with elevation. - If continues or become painful, will get U/S.   Discharge Instructions Discharge Instructions    Call MD for:  difficulty breathing, headache or visual disturbances    Complete by:  As directed    Call MD for:  persistant dizziness or light-headedness    Complete by:  As directed    Call MD for:  persistant nausea and vomiting    Complete by:  As directed    Call MD for:  temperature >100.4    Complete by:  As directed    Discharge instructions    Complete by:  As directed    - Stop plavix - Start eliquis to reduce risk of stroke due to AFib  - Continue taking cipro once daily, starting tomorrow, for 3 more days to complete treatment for UTI and blood stream infection.  - Your thyroid function appears mildly abnormal. This could be from your acute illness, so do not change the dose of synthroid. - Your blood pressure has been low, but improved with extra fluids, so it is very important that you take enough fluids every day. - Follow up with your PCP in the next 1 - 2 weeks for hospital follow up appointment.  - Follow up with cardiology as scheduled     Allergies as of 04/28/2017   No Known Allergies     Medication List    STOP taking these medications   clopidogrel 75 MG tablet Commonly known as:  PLAVIX     TAKE these medications   ANACIN 400-32 MG Tabs Generic drug:  Aspirin-Caffeine Take 2 tablets by mouth daily as needed (eadache).   apixaban 2.5 MG Tabs tablet Commonly known  as:  ELIQUIS Take 1 tablet (2.5 mg total) by mouth 2 (two) times daily.   atorvastatin 10 MG tablet Commonly known as:  LIPITOR Take 1 tablet (10 mg total) by mouth daily.   ciprofloxacin 500 MG tablet Commonly known as:  CIPRO Take 1 tablet (500 mg total) by mouth daily.   cyanocobalamin 1000 MCG/ML injection Commonly known as:  (VITAMIN B-12) INJECT 1 ML INTERMUSCLUARLLY ONCE A MONTH. What changed:  how much to take  how to take this  when to take this  additional instructions   levothyroxine 25 MCG tablet Commonly known as:  SYNTHROID, LEVOTHROID Take 1 tablet (25 mcg total) by mouth daily before breakfast.   metoprolol tartrate 25 MG tablet Commonly known as:  LOPRESSOR Take 0.5 tablets (12.5 mg total) by mouth 2 (two) times daily.   multivitamin with minerals Tabs tablet Take 1 tablet by mouth daily.   nystatin cream Commonly known as:  MYCOSTATIN APPLY 1 APPLICATION TOPICALLY 2 (TWO) TIMES DAILY What changed:  when to take this  reasons to take this   oxybutynin 5 MG tablet Commonly known as:  DITROPAN TAKE 1 TABLET TWICE DAILY   PRESERVISION AREDS 2 Caps Take 1 capsule by mouth 2 (two) times  daily.       No Known Allergies  Consultations:  PCCM  Cardiology, Dr. Curt Bears  Procedures/Studies: Dg Chest Portable 1 View  Result Date: 04/22/2017 CLINICAL DATA:  Patient with fever and tachycardia. EXAM: PORTABLE CHEST 1 VIEW COMPARISON:  Chest radiograph 10/26/2016. FINDINGS: Patient is rotated to the left. Monitoring leads overlie the patient. Stable enlarged cardiac and mediastinal contours. Aortic vascular calcifications. Minimal heterogeneous opacities left lung base. No pleural effusion or pneumothorax. IMPRESSION: Minimal heterogeneous opacities left lung base favored to represent atelectasis. Electronically Signed   By: Lovey Newcomer M.D.   On: 04/22/2017 08:11    ECHOCARDIOGRAM 10/27/2016 (previous admission) - Left ventricle: There is akinesis  of the basal and mid inferolateral and inferior walls. The cavity size was normal. Wall thickness was increased in a pattern of moderate LVH. There was moderate concentric hypertrophy. Systolic function was mildly reduced. The estimated ejection fraction was in the range of 40% to 45%. Wall motion was normal; there were no regional wall motion abnormalities. Doppler parameters are consistent with abnormal left ventricular relaxation (grade 1 diastolic dysfunction). Doppler parameters are consistent with elevated ventricular end-diastolic filling pressure. - Aortic valve: Trileaflet; severely thickened, severely calcified leaflets. Valve mobility was restricted. There was severe stenosis. There was mild regurgitation. Mean gradient (S): 39 mm Hg. Valve area (VTI): 0.86 cm^2. Valve area (Vmax): 0.76 cm^2. Valve area (Vmean): 0.74 cm^2. - Aortic root: The aortic root was dilated measuring 45 mm. - Ascending aorta: The ascending aorta was dilated measuring 50 mm. - Mitral valve: There was trivial regurgitation. - Right ventricle: Systolic function was normal. - Tricuspid valve: There was mild regurgitation. - Pulmonic valve: There was mild regurgitation. - Pulmonary arteries: Systolic pressure was within the normal range. - Pericardium, extracardiac: There was no pericardial effusion.  Impressions: - Compared to the prior study from 02/13/2013 there are significant changes: 1. Aortic stenosis is now severe 2. There are new wall motion abnormalities in the RCA territory 3. LVEF has decreased from 55-60% to 40-45% 4. Aortic root remains dilated at 45 mm, ascending aorta is dilated at 50 mm, previously reported as 46mm.  Subjective: Pt eating better than before, ambulates with assistance. Right arm/hand still clumsy but improving, swelling improving with elevation and nontender. No fevers, chills, dizziness, fatigue or dysuria.   Discharge  Exam: BP 106/74 (BP Location: Left Arm)   Pulse (!) 108   Temp 97.9 F (36.6 C) (Oral)   Resp 17   Ht 5' (1.524 m)   Wt 50.7 kg (111 lb 12.4 oz)   SpO2 93%   BMI 21.83 kg/m   General: Pt is alert, awake, not in acute distress Cardiovascular: Irreg. II/VI early systolic murmur at RUSB radiating to carotids. No JVD, and no pedal edema. Respiratory: CTA bilaterally, no wheezing, no rhonchi Abdominal: Soft, NT, ND, bowel sounds + Neuro: Alert and oriented. Right hand with abnormal fine motor skills, grossly intact strength and no numbness on exam.  Ext: Right hand edematous nontender. Senile purpura. Right elbow with skin avulsion hemostatic without purulence.   Labs: Basic Metabolic Panel:  Recent Labs Lab 04/23/17 0154 04/24/17 0158 04/25/17 0150 04/27/17 0209 04/28/17 0411  NA 135 137 139 137 136  K 3.6 3.6 3.7 3.7 3.9  CL 111 113* 111 108 108  CO2 16* 19* 22 23 23   GLUCOSE 77 86 83 77 89  BUN 24* 14 12 13 12   CREATININE 1.04* 0.77 0.66 0.60 0.61  CALCIUM 7.3* 7.7* 7.8* 7.7* 7.9*  MG 1.2*  --   --   --   --   PHOS 3.1  --   --   --   --    Liver Function Tests:  Recent Labs Lab 04/22/17 0737  AST 22  ALT 9*  ALKPHOS 85  BILITOT 0.6  PROT 5.2*  ALBUMIN 2.3*   CBC:  Recent Labs Lab 04/22/17 0737 04/23/17 0154 04/25/17 0150 04/27/17 0209  WBC 20.2* 17.3* 6.9 6.4  NEUTROABS 18.5*  --   --   --   HGB 10.6* 9.0* 9.7* 10.1*  HCT 33.3* 27.6* 29.4* 31.6*  MCV 96.2 94.8 93.6 93.2  PLT 181 156 165 195   Cardiac Enzymes:  Recent Labs Lab 04/22/17 1411 04/23/17 0154  TROPONINI 0.25* 0.39*   CBG:  Recent Labs Lab 04/24/17 1120 04/24/17 1515 04/24/17 2012 04/25/17 1506 04/26/17 1123  GLUCAP 99 144* 106* 140* 121*   Thyroid function studies  Recent Labs  04/28/17 0411  TSH 7.910*   Anemia work up  Recent Labs  04/27/17 0209  VITAMINB12 1,038*  FOLATE 27.6  FERRITIN 296  TIBC 139*  IRON 44  RETICCTPCT 1.3   Urinalysis     Component Value Date/Time   COLORURINE AMBER (A) 04/22/2017 1850   APPEARANCEUR HAZY (A) 04/22/2017 1850   LABSPEC 1.014 04/22/2017 1850   PHURINE 5.0 04/22/2017 1850   GLUCOSEU NEGATIVE 04/22/2017 1850   HGBUR SMALL (A) 04/22/2017 1850   BILIRUBINUR NEGATIVE 04/22/2017 1850   BILIRUBINUR negative 11/22/2016 1435   KETONESUR NEGATIVE 04/22/2017 1850   PROTEINUR NEGATIVE 04/22/2017 1850   UROBILINOGEN 0.2 11/22/2016 1435   NITRITE NEGATIVE 04/22/2017 1850   LEUKOCYTESUR LARGE (A) 04/22/2017 1850    Microbiology Recent Results (from the past 240 hour(s))  Culture, blood (Routine x 2)     Status: Abnormal   Collection Time: 04/22/17  7:50 AM  Result Value Ref Range Status   Specimen Description BLOOD RIGHT FOREARM  Final   Special Requests IN PEDIATRIC BOTTLE Blood Culture adequate volume  Final   Culture  Setup Time   Final    IN PEDIATRIC BOTTLE GRAM NEGATIVE RODS CRITICAL VALUE NOTED.  VALUE IS CONSISTENT WITH PREVIOUSLY REPORTED AND CALLED VALUE.    Culture PSEUDOMONAS AERUGINOSA (A)  Final   Report Status 04/25/2017 FINAL  Final   Organism ID, Bacteria PSEUDOMONAS AERUGINOSA  Final      Susceptibility   Pseudomonas aeruginosa - MIC*    CEFTAZIDIME 4 SENSITIVE Sensitive     CIPROFLOXACIN <=0.25 SENSITIVE Sensitive     GENTAMICIN <=1 SENSITIVE Sensitive     IMIPENEM 2 SENSITIVE Sensitive     PIP/TAZO 16 SENSITIVE Sensitive     CEFEPIME 4 SENSITIVE Sensitive     * PSEUDOMONAS AERUGINOSA  Culture, blood (Routine x 2)     Status: Abnormal   Collection Time: 04/22/17  7:56 AM  Result Value Ref Range Status   Specimen Description BLOOD LEFT ANTECUBITAL  Final   Special Requests   Final    BOTTLES DRAWN AEROBIC AND ANAEROBIC Blood Culture adequate volume   Culture  Setup Time   Final    AEROBIC BOTTLE ONLY GRAM NEGATIVE RODS CRITICAL RESULT CALLED TO, READ BACK BY AND VERIFIED WITH: TO KCOOK(PHARMd) BY TCLEVELAND 04/23/2017 AT 6:21AM    Culture (A)  Final    PSEUDOMONAS  AERUGINOSA SUSCEPTIBILITIES PERFORMED ON PREVIOUS CULTURE WITHIN THE LAST 5 DAYS.    Report Status 04/25/2017 FINAL  Final  Blood Culture ID Panel (Reflexed)  Status: Abnormal   Collection Time: 04/22/17  7:56 AM  Result Value Ref Range Status   Enterococcus species NOT DETECTED NOT DETECTED Final   Listeria monocytogenes NOT DETECTED NOT DETECTED Final   Staphylococcus species NOT DETECTED NOT DETECTED Final   Staphylococcus aureus NOT DETECTED NOT DETECTED Final   Streptococcus species NOT DETECTED NOT DETECTED Final   Streptococcus agalactiae NOT DETECTED NOT DETECTED Final   Streptococcus pneumoniae NOT DETECTED NOT DETECTED Final   Streptococcus pyogenes NOT DETECTED NOT DETECTED Final   Acinetobacter baumannii NOT DETECTED NOT DETECTED Final   Enterobacteriaceae species NOT DETECTED NOT DETECTED Final   Enterobacter cloacae complex NOT DETECTED NOT DETECTED Final   Escherichia coli NOT DETECTED NOT DETECTED Final   Klebsiella oxytoca NOT DETECTED NOT DETECTED Final   Klebsiella pneumoniae NOT DETECTED NOT DETECTED Final   Proteus species NOT DETECTED NOT DETECTED Final   Serratia marcescens NOT DETECTED NOT DETECTED Final   Carbapenem resistance NOT DETECTED NOT DETECTED Final   Haemophilus influenzae NOT DETECTED NOT DETECTED Final   Neisseria meningitidis NOT DETECTED NOT DETECTED Final   Pseudomonas aeruginosa DETECTED (A) NOT DETECTED Final    Comment: CRITICAL RESULT CALLED TO, READ BACK BY AND VERIFIED WITH: TO KCOOK(PHARMd) BY TCLEVELAND 04/23/2017 AT 6:21AM    Candida albicans NOT DETECTED NOT DETECTED Final   Candida glabrata NOT DETECTED NOT DETECTED Final   Candida krusei NOT DETECTED NOT DETECTED Final   Candida parapsilosis NOT DETECTED NOT DETECTED Final   Candida tropicalis NOT DETECTED NOT DETECTED Final  Urine culture     Status: Abnormal   Collection Time: 04/22/17  8:08 AM  Result Value Ref Range Status   Specimen Description URINE, CATHETERIZED  Final    Special Requests NONE  Final   Culture <10,000 COLONIES/mL INSIGNIFICANT GROWTH (A)  Final   Report Status 04/23/2017 FINAL  Final  MRSA PCR Screening     Status: None   Collection Time: 04/22/17  1:54 PM  Result Value Ref Range Status   MRSA by PCR NEGATIVE NEGATIVE Final    Comment:        The GeneXpert MRSA Assay (FDA approved for NASAL specimens only), is one component of a comprehensive MRSA colonization surveillance program. It is not intended to diagnose MRSA infection nor to guide or monitor treatment for MRSA infections.   Urine culture     Status: Abnormal   Collection Time: 04/22/17  6:20 PM  Result Value Ref Range Status   Specimen Description URINE, CATHETERIZED  Final   Special Requests NONE  Final   Culture 20,000 COLONIES/mL PSEUDOMONAS AERUGINOSA (A)  Final   Report Status 04/25/2017 FINAL  Final   Organism ID, Bacteria PSEUDOMONAS AERUGINOSA (A)  Final      Susceptibility   Pseudomonas aeruginosa - MIC*    CEFTAZIDIME 4 SENSITIVE Sensitive     CIPROFLOXACIN <=0.25 SENSITIVE Sensitive     GENTAMICIN 2 SENSITIVE Sensitive     IMIPENEM 2 SENSITIVE Sensitive     PIP/TAZO 16 SENSITIVE Sensitive     CEFEPIME 4 SENSITIVE Sensitive     * 20,000 COLONIES/mL PSEUDOMONAS AERUGINOSA    Time coordinating discharge: Approximately 40 minutes  Vance Gather, MD  [facility] 04/28/2017, 3:23 PM Pager [phone]

## 2017-04-28 NOTE — Progress Notes (Signed)
Physical Therapy Treatment Patient Details Name: [name] [name] MRN: [id number] DOB: 1926/01/30 Today's Date: 04/28/2017    History of Present Illness Pt adm with sepsis due to pseudomonas UTI.  PMH - HTN, CVA, TIA, copd, AAA, severe scoliosis    PT Comments    Patient making progress with mobility and gait.  Daughter present for session today.  No questions.   Follow Up Recommendations  Home health PT;Supervision/Assistance - 24 hour;Other (comment) [facility] Aide)     Equipment Recommendations  None recommended by PT    Recommendations for Other Services       Precautions / Restrictions Precautions Precautions: Fall Restrictions Weight Bearing Restrictions: No    Mobility  Bed Mobility Overal bed mobility: Needs Assistance Bed Mobility: Supine to Sit     Supine to sit: Mod assist     General bed mobility comments: cues for technique, assist to raise trunk and advance hips to EOB with bed pad  Transfers Overall transfer level: Needs assistance Equipment used: 4-wheeled walker Transfers: Sit to/from [facility] Sit to Stand: Mod assist;+2 safety/equipment Stand pivot transfers: Mod assist;+2 safety/equipment       General transfer comment: 2 person assist to rise from low BSC, one person from bed, pulls up on rollator, able to place hands on walker handles without assist today  Ambulation/Gait Ambulation/Gait assistance: +2 safety/equipment;Min assist Ambulation Distance (Feet): 36 Feet Assistive device: 4-wheeled walker Gait Pattern/deviations: Trunk flexed;Shuffle;Step-to pattern Gait velocity: decreased Gait velocity interpretation: Below normal speed for age/gender General Gait Details: Pt with extreme trunk flexion resulting in trunk being almost parallel to floor. Assist for balance and support. Pt with left foot almost hitting wheel of rollator on left. Assist to keep rollator slightly more left to prevent this. Pt with HR to 140's with amb.  Nursing aware.   Stairs            Wheelchair Mobility    Modified Rankin (Stroke Patients Only)       Balance           Standing balance support: Bilateral upper extremity supported Standing balance-Leahy Scale: Poor Standing balance comment: rollator and min A for static standing                            Cognition Arousal/Alertness: Awake/alert Behavior During Therapy: WFL for tasks assessed/performed Overall Cognitive Status: Within Functional Limits for tasks assessed                                        Exercises      General Comments        Pertinent Vitals/Pain Pain Assessment: Faces Faces Pain Scale: Hurts a little bit Pain Location: back Pain Descriptors / Indicators: Discomfort Pain Intervention(s): Monitored during session;Repositioned    Home Living                      Prior Function            PT Goals (current goals can now be found in the care plan section) Acute Rehab PT Goals Patient Stated Goal: return home Progress towards PT goals: Progressing toward goals    Frequency    Min 3X/week      PT Plan Current plan remains appropriate    Co-evaluation PT/OT/SLP Co-Evaluation/Treatment: Yes Reason for Co-Treatment: For patient/therapist safety  PT goals addressed during session: Mobility/safety with mobility        AM-PAC PT "6 Clicks" Daily Activity  Outcome Measure  Difficulty turning over in bed (including adjusting bedclothes, sheets and blankets)?: Total Difficulty moving from lying on back to sitting on the side of the bed? : Total Difficulty sitting down on and standing up from a chair with arms (e.g., wheelchair, bedside commode, etc,.)?: Total Help needed moving to and from a bed to chair (including a wheelchair)?: A Lot Help needed walking in hospital room?: A Little Help needed climbing 3-5 steps with a railing? : A Lot 6 Click Score: 10    End of Session Equipment  Utilized During Treatment: Gait belt Activity Tolerance: Patient limited by fatigue Patient left: in chair;with call bell/phone within reach;with family/visitor present Nurse Communication: Mobility status (Increased HR) PT Visit Diagnosis: Muscle weakness (generalized) (M62.81);Difficulty in walking, not elsewhere classified (R26.2)     Time: 5643-3295 PT Time Calculation (min) (ACUTE ONLY): 32 min  Charges:  $Gait Training: 8-22 mins                    G Codes:       Carita Pian. Sanjuana Kava, [facility] Acute Rehab Services Pager [place] 04/28/2017, 9:11 PM

## 2017-04-29 LAB — T3, FREE: T3 FREE: 1.9 pg/mL — AB (ref 2.0–4.4)

## 2017-05-02 ENCOUNTER — Telehealth: Payer: Self-pay

## 2017-05-02 NOTE — Telephone Encounter (Signed)
Spoke with patients daughter, [name].    Transition Care Management Follow-up Telephone Call   Date discharged? 04/28/17   How have you been since you were released from the hospital? "She's getting better, still weak"   Do you understand why you were in the hospital? yes   Do you understand the discharge instructions? yes   Where were you discharged to? Home. Daughter lives with patient. Siblings alternate care.    Items Reviewed:  Medications reviewed: yes. Daughter requesting PCP handle Eliquis prescription rather than Cardiology.   Allergies reviewed: yes  Dietary changes reviewed: yes  Referrals reviewed: yes. OT/PT and nurse aide to start this week.     Functional Questionnaire:   Activities of Daily Living (ADLs):   She states they are independent in the following: None States they require assistance with the following: ambulation, bathing and hygiene, feeding, continence, grooming, toileting and dressing. Daughter states pts children are assisting with ADL's. Has neuropathy in hands, coordination poor, therefore difficult to eat on her own, get out of bed, etc.    Any transportation issues/concerns?: no   Any patient concerns? yes, daughter not sure she needs cardiology f/u, would like Dr. Lorelei Pont to handle Eliquis prescription if possible. Patient has temporary neuropathy in hands, daughter states this was addressed in hospital.    Confirmed importance and date/time of follow-up visits scheduled yes  Provider Appointment booked with (message sent to Leticia Penna, RN for assistance in scheduling F/U)  Confirmed with patient if condition begins to worsen call PCP or go to the ER.  Patient was given the office number and encouraged to call back with question or concerns.  : yes

## 2017-05-02 NOTE — Telephone Encounter (Signed)
I think this is okay.  [name] [name] is taken very very good care of her mother for a long time, and she is probably a better judge in this than anyone else.

## 2017-05-02 NOTE — Telephone Encounter (Signed)
Spoke with [name].  She does not feel like her mom will be able to come to the office this week. She states her mom is still very weak.  They are okay waiting until Dr. Lorelei Pont returns from vacation.  Appointment scheduled for 05/17/2017 at 11:00 am for hospital follow up.

## 2017-05-02 NOTE — Telephone Encounter (Signed)
Follow-up will be somewhat challenging given that I am going to be out of town for about 10 days.  It appears that I would be able to see her on Thursday. 05/04/2017  I will send to [name] to see if she can help

## 2017-05-03 DIAGNOSIS — E43 Unspecified severe protein-calorie malnutrition: Secondary | ICD-10-CM | POA: Diagnosis not present

## 2017-05-03 DIAGNOSIS — D509 Iron deficiency anemia, unspecified: Secondary | ICD-10-CM | POA: Diagnosis not present

## 2017-05-03 DIAGNOSIS — I350 Nonrheumatic aortic (valve) stenosis: Secondary | ICD-10-CM | POA: Diagnosis not present

## 2017-05-03 DIAGNOSIS — I712 Thoracic aortic aneurysm, without rupture: Secondary | ICD-10-CM | POA: Diagnosis not present

## 2017-05-03 DIAGNOSIS — Z8673 Personal history of transient ischemic attack (TIA), and cerebral infarction without residual deficits: Secondary | ICD-10-CM | POA: Diagnosis not present

## 2017-05-03 DIAGNOSIS — I480 Paroxysmal atrial fibrillation: Secondary | ICD-10-CM | POA: Diagnosis not present

## 2017-05-03 DIAGNOSIS — N390 Urinary tract infection, site not specified: Secondary | ICD-10-CM | POA: Diagnosis not present

## 2017-05-03 DIAGNOSIS — R262 Difficulty in walking, not elsewhere classified: Secondary | ICD-10-CM | POA: Diagnosis not present

## 2017-05-03 DIAGNOSIS — M6281 Muscle weakness (generalized): Secondary | ICD-10-CM | POA: Diagnosis not present

## 2017-05-05 DIAGNOSIS — D509 Iron deficiency anemia, unspecified: Secondary | ICD-10-CM | POA: Diagnosis not present

## 2017-05-05 DIAGNOSIS — E43 Unspecified severe protein-calorie malnutrition: Secondary | ICD-10-CM | POA: Diagnosis not present

## 2017-05-05 DIAGNOSIS — M6281 Muscle weakness (generalized): Secondary | ICD-10-CM | POA: Diagnosis not present

## 2017-05-05 DIAGNOSIS — I480 Paroxysmal atrial fibrillation: Secondary | ICD-10-CM | POA: Diagnosis not present

## 2017-05-05 DIAGNOSIS — I712 Thoracic aortic aneurysm, without rupture: Secondary | ICD-10-CM | POA: Diagnosis not present

## 2017-05-05 DIAGNOSIS — N390 Urinary tract infection, site not specified: Secondary | ICD-10-CM | POA: Diagnosis not present

## 2017-05-05 DIAGNOSIS — I350 Nonrheumatic aortic (valve) stenosis: Secondary | ICD-10-CM | POA: Diagnosis not present

## 2017-05-05 DIAGNOSIS — R262 Difficulty in walking, not elsewhere classified: Secondary | ICD-10-CM | POA: Diagnosis not present

## 2017-05-05 DIAGNOSIS — Z8673 Personal history of transient ischemic attack (TIA), and cerebral infarction without residual deficits: Secondary | ICD-10-CM | POA: Diagnosis not present

## 2017-05-08 ENCOUNTER — Other Ambulatory Visit (INDEPENDENT_AMBULATORY_CARE_PROVIDER_SITE_OTHER): Payer: Medicare HMO | Admitting: Family Medicine

## 2017-05-08 ENCOUNTER — Telehealth: Payer: Self-pay | Admitting: Family Medicine

## 2017-05-08 DIAGNOSIS — R300 Dysuria: Secondary | ICD-10-CM

## 2017-05-08 DIAGNOSIS — D509 Iron deficiency anemia, unspecified: Secondary | ICD-10-CM | POA: Diagnosis not present

## 2017-05-08 DIAGNOSIS — R262 Difficulty in walking, not elsewhere classified: Secondary | ICD-10-CM | POA: Diagnosis not present

## 2017-05-08 DIAGNOSIS — M6281 Muscle weakness (generalized): Secondary | ICD-10-CM | POA: Diagnosis not present

## 2017-05-08 DIAGNOSIS — I712 Thoracic aortic aneurysm, without rupture: Secondary | ICD-10-CM | POA: Diagnosis not present

## 2017-05-08 DIAGNOSIS — I480 Paroxysmal atrial fibrillation: Secondary | ICD-10-CM | POA: Diagnosis not present

## 2017-05-08 DIAGNOSIS — N390 Urinary tract infection, site not specified: Secondary | ICD-10-CM | POA: Diagnosis not present

## 2017-05-08 DIAGNOSIS — Z8673 Personal history of transient ischemic attack (TIA), and cerebral infarction without residual deficits: Secondary | ICD-10-CM | POA: Diagnosis not present

## 2017-05-08 DIAGNOSIS — I350 Nonrheumatic aortic (valve) stenosis: Secondary | ICD-10-CM | POA: Diagnosis not present

## 2017-05-08 DIAGNOSIS — E43 Unspecified severe protein-calorie malnutrition: Secondary | ICD-10-CM | POA: Diagnosis not present

## 2017-05-08 LAB — URINALYSIS, ROUTINE W REFLEX MICROSCOPIC
BILIRUBIN URINE: NEGATIVE
Hgb urine dipstick: NEGATIVE
Ketones, ur: NEGATIVE
LEUKOCYTES UA: NEGATIVE
NITRITE: NEGATIVE
PH: 6 (ref 5.0–8.0)
Specific Gravity, Urine: 1.025 (ref 1.000–1.030)
TOTAL PROTEIN, URINE-UPE24: 30 — AB
URINE GLUCOSE: NEGATIVE
UROBILINOGEN UA: 0.2 (ref 0.0–1.0)

## 2017-05-08 NOTE — Addendum Note (Signed)
Addended by: Ellamae Sia on: 05/08/2017 11:47 AM   Modules accepted: Orders

## 2017-05-08 NOTE — Telephone Encounter (Signed)
Patient Name: [name] [name]  DOB: 07/26/1926    Initial Comment Dorian Pod states her mom was in the hospital for a UTI that turned to sepsis. She came home on 7/13. This past weekend, she has started having burning again and having weakness.   Nurse Assessment  Nurse: Raphael Gibney, RN, Vanita Ingles Date/Time (Eastern Time): 05/08/2017 8:23:17 AM  Confirm and document reason for call. If symptomatic, describe symptoms. ---Caller states mother was in hospital for UTI which turned to sepsis. She came home on 7/13. She did well until Friday. She started getting tired on Friday. Over the weekend, she started having weakness and joint pain She started burning with urination yesterday. Has urine specimen ready to bring. She has a good appetite.  Does the patient have any new or worsening symptoms? ---Yes  Will a triage be completed? ---Yes  Related visit to physician within the last 2 weeks? ---No  Does the PT have any chronic conditions? (i.e. diabetes, asthma, etc.) ---Yes  List chronic conditions. ---heart problems; A fib  Is this a behavioral health or substance abuse call? ---No     Guidelines    Guideline Title Affirmed Question Affirmed Notes  Urination Pain - Female Age > 50 years   Weakness (Generalized) and Fatigue [1] MODERATE weakness (i.e., interferes with work, school, normal activities) AND [2] persists > 3 days    Final Disposition User   See Physician within 73 Hours [place], RN, Vanita Ingles    Comments  daughter states that mother is too weak to bring to the office. She has a clean catch urine specimen ready to bring. Please call daughter back regarding urine specimen.   Referrals  GO TO FACILITY REFUSED   Disagree/Comply: Disagree  Disagree/Comply Reason: Disagree with instructions  Disagree/Comply: Disagree  Disagree/Comply Reason: Disagree with instructions

## 2017-05-08 NOTE — Addendum Note (Signed)
Addended by: Carter Kitten on: 05/08/2017 11:15 AM   Modules accepted: Orders

## 2017-05-08 NOTE — Telephone Encounter (Signed)
In this case, I think this is reasonable. [place] - I will have to involve Dr. Jacinto Reap.   I would get UA and culture  Cc: Dr. Jacinto Reap and [name]

## 2017-05-08 NOTE — Telephone Encounter (Signed)
Okay, I will follow up on this. Given I am not at the office today.. Please have the UA and micro as well as the culture sent to the lab so we can have the answer as soon as possible.

## 2017-05-08 NOTE — Telephone Encounter (Addendum)
Spoke with [name] [name] and advised her to bring urine sample by office.   Will send out of UA, Micro and Culture.  Future orders entered in Epic.  Advised we would call them once we get the results back to let them know if she needs to be treated with antibiotics.  [name] [name] states understanding.

## 2017-05-09 ENCOUNTER — Telehealth: Payer: Self-pay | Admitting: Family Medicine

## 2017-05-09 NOTE — Telephone Encounter (Signed)
Pt's daughter called to get results of labs.

## 2017-05-09 NOTE — Telephone Encounter (Signed)
Notify daughter..urine dip a nd micro looks clear, urine culture will take a few days to come back.  But likely no UTI.  If pt still having symptoms.. She may need to be seen.

## 2017-05-09 NOTE — Telephone Encounter (Signed)
Patient's daughter,[name],called to get the results of lab work.

## 2017-05-09 NOTE — Telephone Encounter (Signed)
[name] notified as instructed by telephone.  Advised I will call her once we have the culture results back.  She is so concerned that her mom is so weak that they would not be able to bring her in to the office and she is not sure they will be able to make her hospital follow up with Dr. Lorelei Pont next week.  They are looking into Hospice to maybe get some help inside the home.  [name] will call us if they want [place] to to refer Ms. Corkum for hospice care.

## 2017-05-10 LAB — URINE CULTURE: ORGANISM ID, BACTERIA: NO GROWTH

## 2017-05-12 ENCOUNTER — Ambulatory Visit: Payer: Medicare HMO | Admitting: Physician Assistant

## 2017-05-12 DIAGNOSIS — D509 Iron deficiency anemia, unspecified: Secondary | ICD-10-CM | POA: Diagnosis not present

## 2017-05-12 DIAGNOSIS — R262 Difficulty in walking, not elsewhere classified: Secondary | ICD-10-CM | POA: Diagnosis not present

## 2017-05-12 DIAGNOSIS — E43 Unspecified severe protein-calorie malnutrition: Secondary | ICD-10-CM | POA: Diagnosis not present

## 2017-05-12 DIAGNOSIS — I350 Nonrheumatic aortic (valve) stenosis: Secondary | ICD-10-CM | POA: Diagnosis not present

## 2017-05-12 DIAGNOSIS — I480 Paroxysmal atrial fibrillation: Secondary | ICD-10-CM | POA: Diagnosis not present

## 2017-05-12 DIAGNOSIS — I712 Thoracic aortic aneurysm, without rupture: Secondary | ICD-10-CM | POA: Diagnosis not present

## 2017-05-12 DIAGNOSIS — Z8673 Personal history of transient ischemic attack (TIA), and cerebral infarction without residual deficits: Secondary | ICD-10-CM | POA: Diagnosis not present

## 2017-05-12 DIAGNOSIS — N390 Urinary tract infection, site not specified: Secondary | ICD-10-CM | POA: Diagnosis not present

## 2017-05-12 DIAGNOSIS — M6281 Muscle weakness (generalized): Secondary | ICD-10-CM | POA: Diagnosis not present

## 2017-05-15 ENCOUNTER — Telehealth: Payer: Self-pay | Admitting: *Deleted

## 2017-05-15 ENCOUNTER — Other Ambulatory Visit: Payer: Self-pay | Admitting: *Deleted

## 2017-05-15 DIAGNOSIS — Z8673 Personal history of transient ischemic attack (TIA), and cerebral infarction without residual deficits: Secondary | ICD-10-CM | POA: Diagnosis not present

## 2017-05-15 DIAGNOSIS — I480 Paroxysmal atrial fibrillation: Secondary | ICD-10-CM | POA: Diagnosis not present

## 2017-05-15 DIAGNOSIS — I712 Thoracic aortic aneurysm, without rupture: Secondary | ICD-10-CM | POA: Diagnosis not present

## 2017-05-15 DIAGNOSIS — D509 Iron deficiency anemia, unspecified: Secondary | ICD-10-CM | POA: Diagnosis not present

## 2017-05-15 DIAGNOSIS — E43 Unspecified severe protein-calorie malnutrition: Secondary | ICD-10-CM | POA: Diagnosis not present

## 2017-05-15 DIAGNOSIS — N390 Urinary tract infection, site not specified: Secondary | ICD-10-CM | POA: Diagnosis not present

## 2017-05-15 DIAGNOSIS — R262 Difficulty in walking, not elsewhere classified: Secondary | ICD-10-CM | POA: Diagnosis not present

## 2017-05-15 DIAGNOSIS — I350 Nonrheumatic aortic (valve) stenosis: Secondary | ICD-10-CM | POA: Diagnosis not present

## 2017-05-15 DIAGNOSIS — M6281 Muscle weakness (generalized): Secondary | ICD-10-CM | POA: Diagnosis not present

## 2017-05-15 NOTE — Telephone Encounter (Signed)
[name] [name] physical therapist with [facility] left a voicemail stating that patient missed 2 visits last week because she was not feeling well. [name] [name] wants to know if she can make the visits up this week?

## 2017-05-15 NOTE — Telephone Encounter (Signed)
Patient's daughter [name] [name] on [place]) left a voicemail stating that patient's Plavix was changed to Eliquis when her mom was in the hospital.  [name] [name] stated that they know that patient needs to come in for a hospital follow-up, but she is still real weak and hoping that she can come in soon for that. [name] [name] stated that she needs Dr. Lorelei Pont to send a new script to [facility] for this new medication.

## 2017-05-16 MED ORDER — APIXABAN 2.5 MG PO TABS: 2.5000 mg | ORAL_TABLET | Freq: Two times a day (BID) | ORAL | 0 refills | Status: DC

## 2017-05-16 NOTE — Telephone Encounter (Signed)
yes

## 2017-05-16 NOTE — Telephone Encounter (Signed)
Spoke to [employer], [place]. She will add the missed visits to the end of her time.

## 2017-05-17 ENCOUNTER — Ambulatory Visit: Payer: Medicare HMO | Admitting: Family Medicine

## 2017-05-17 ENCOUNTER — Telehealth: Payer: Self-pay

## 2017-05-17 DIAGNOSIS — I480 Paroxysmal atrial fibrillation: Secondary | ICD-10-CM | POA: Diagnosis not present

## 2017-05-17 DIAGNOSIS — I350 Nonrheumatic aortic (valve) stenosis: Secondary | ICD-10-CM | POA: Diagnosis not present

## 2017-05-17 DIAGNOSIS — M6281 Muscle weakness (generalized): Secondary | ICD-10-CM | POA: Diagnosis not present

## 2017-05-17 DIAGNOSIS — R262 Difficulty in walking, not elsewhere classified: Secondary | ICD-10-CM | POA: Diagnosis not present

## 2017-05-17 DIAGNOSIS — D509 Iron deficiency anemia, unspecified: Secondary | ICD-10-CM | POA: Diagnosis not present

## 2017-05-17 DIAGNOSIS — E43 Unspecified severe protein-calorie malnutrition: Secondary | ICD-10-CM | POA: Diagnosis not present

## 2017-05-17 DIAGNOSIS — Z8673 Personal history of transient ischemic attack (TIA), and cerebral infarction without residual deficits: Secondary | ICD-10-CM | POA: Diagnosis not present

## 2017-05-17 DIAGNOSIS — I712 Thoracic aortic aneurysm, without rupture: Secondary | ICD-10-CM | POA: Diagnosis not present

## 2017-05-17 DIAGNOSIS — N390 Urinary tract infection, site not specified: Secondary | ICD-10-CM | POA: Diagnosis not present

## 2017-05-17 NOTE — Telephone Encounter (Signed)
Amber notified by telephone that Dr Lorelei Pont will be the attending of records for hospice.

## 2017-05-17 NOTE — Telephone Encounter (Signed)
Amber with [facility] left v/m that pts family has requested a hospice assessment and request that Dr Lorelei Pont be attending of record for hospice care. Amber request cb.

## 2017-05-17 NOTE — Telephone Encounter (Signed)
Yes, I am happy to do so.

## 2017-05-18 DIAGNOSIS — M6281 Muscle weakness (generalized): Secondary | ICD-10-CM | POA: Diagnosis not present

## 2017-05-18 DIAGNOSIS — E43 Unspecified severe protein-calorie malnutrition: Secondary | ICD-10-CM | POA: Diagnosis not present

## 2017-05-18 DIAGNOSIS — I350 Nonrheumatic aortic (valve) stenosis: Secondary | ICD-10-CM | POA: Diagnosis not present

## 2017-05-18 DIAGNOSIS — Z8673 Personal history of transient ischemic attack (TIA), and cerebral infarction without residual deficits: Secondary | ICD-10-CM | POA: Diagnosis not present

## 2017-05-18 DIAGNOSIS — D509 Iron deficiency anemia, unspecified: Secondary | ICD-10-CM | POA: Diagnosis not present

## 2017-05-18 DIAGNOSIS — I480 Paroxysmal atrial fibrillation: Secondary | ICD-10-CM | POA: Diagnosis not present

## 2017-05-18 DIAGNOSIS — I712 Thoracic aortic aneurysm, without rupture: Secondary | ICD-10-CM | POA: Diagnosis not present

## 2017-05-18 DIAGNOSIS — R262 Difficulty in walking, not elsewhere classified: Secondary | ICD-10-CM | POA: Diagnosis not present

## 2017-05-18 DIAGNOSIS — N390 Urinary tract infection, site not specified: Secondary | ICD-10-CM | POA: Diagnosis not present

## 2017-05-19 ENCOUNTER — Telehealth: Payer: Self-pay | Admitting: *Deleted

## 2017-05-19 DIAGNOSIS — I350 Nonrheumatic aortic (valve) stenosis: Secondary | ICD-10-CM | POA: Diagnosis not present

## 2017-05-19 DIAGNOSIS — D509 Iron deficiency anemia, unspecified: Secondary | ICD-10-CM | POA: Diagnosis not present

## 2017-05-19 DIAGNOSIS — M6281 Muscle weakness (generalized): Secondary | ICD-10-CM | POA: Diagnosis not present

## 2017-05-19 DIAGNOSIS — Z8673 Personal history of transient ischemic attack (TIA), and cerebral infarction without residual deficits: Secondary | ICD-10-CM | POA: Diagnosis not present

## 2017-05-19 DIAGNOSIS — R262 Difficulty in walking, not elsewhere classified: Secondary | ICD-10-CM | POA: Diagnosis not present

## 2017-05-19 DIAGNOSIS — N390 Urinary tract infection, site not specified: Secondary | ICD-10-CM | POA: Diagnosis not present

## 2017-05-19 DIAGNOSIS — E43 Unspecified severe protein-calorie malnutrition: Secondary | ICD-10-CM | POA: Diagnosis not present

## 2017-05-19 DIAGNOSIS — I712 Thoracic aortic aneurysm, without rupture: Secondary | ICD-10-CM | POA: Diagnosis not present

## 2017-05-19 DIAGNOSIS — I480 Paroxysmal atrial fibrillation: Secondary | ICD-10-CM | POA: Diagnosis not present

## 2017-05-19 NOTE — Telephone Encounter (Addendum)
Dr. Minna Merritts with Hospice called to let Dr. Lorelei Pont know that they went out to evaluate [name] [name] and she did not meet the criteria and was not admitted to [facility].  He did feel she would be a perfect candidate for a home palliative care referral and ask if it would be okay to change the referral from [place] to Palliative Care.  Verbal okay given to change referral.

## 2017-05-19 NOTE — Telephone Encounter (Signed)
Please do so to convert to a palliative care referal

## 2017-05-19 NOTE — Telephone Encounter (Signed)
Verbal okay was given at the time I spoke with Dr. Minna Merritts to covert referral over to Palliative Care.  I don't think anything needs to be done in EPIC.

## 2017-05-22 DIAGNOSIS — D509 Iron deficiency anemia, unspecified: Secondary | ICD-10-CM | POA: Diagnosis not present

## 2017-05-22 DIAGNOSIS — R262 Difficulty in walking, not elsewhere classified: Secondary | ICD-10-CM | POA: Diagnosis not present

## 2017-05-22 DIAGNOSIS — N390 Urinary tract infection, site not specified: Secondary | ICD-10-CM | POA: Diagnosis not present

## 2017-05-22 DIAGNOSIS — I712 Thoracic aortic aneurysm, without rupture: Secondary | ICD-10-CM | POA: Diagnosis not present

## 2017-05-22 DIAGNOSIS — I480 Paroxysmal atrial fibrillation: Secondary | ICD-10-CM | POA: Diagnosis not present

## 2017-05-22 DIAGNOSIS — Z8673 Personal history of transient ischemic attack (TIA), and cerebral infarction without residual deficits: Secondary | ICD-10-CM | POA: Diagnosis not present

## 2017-05-22 DIAGNOSIS — I350 Nonrheumatic aortic (valve) stenosis: Secondary | ICD-10-CM | POA: Diagnosis not present

## 2017-05-22 DIAGNOSIS — E43 Unspecified severe protein-calorie malnutrition: Secondary | ICD-10-CM | POA: Diagnosis not present

## 2017-05-22 DIAGNOSIS — M6281 Muscle weakness (generalized): Secondary | ICD-10-CM | POA: Diagnosis not present

## 2017-05-23 DIAGNOSIS — E43 Unspecified severe protein-calorie malnutrition: Secondary | ICD-10-CM | POA: Diagnosis not present

## 2017-05-23 DIAGNOSIS — I712 Thoracic aortic aneurysm, without rupture: Secondary | ICD-10-CM | POA: Diagnosis not present

## 2017-05-23 DIAGNOSIS — I480 Paroxysmal atrial fibrillation: Secondary | ICD-10-CM | POA: Diagnosis not present

## 2017-05-23 DIAGNOSIS — Z8673 Personal history of transient ischemic attack (TIA), and cerebral infarction without residual deficits: Secondary | ICD-10-CM | POA: Diagnosis not present

## 2017-05-23 DIAGNOSIS — D509 Iron deficiency anemia, unspecified: Secondary | ICD-10-CM | POA: Diagnosis not present

## 2017-05-23 DIAGNOSIS — M6281 Muscle weakness (generalized): Secondary | ICD-10-CM | POA: Diagnosis not present

## 2017-05-23 DIAGNOSIS — R262 Difficulty in walking, not elsewhere classified: Secondary | ICD-10-CM | POA: Diagnosis not present

## 2017-05-23 DIAGNOSIS — N390 Urinary tract infection, site not specified: Secondary | ICD-10-CM | POA: Diagnosis not present

## 2017-05-23 DIAGNOSIS — I350 Nonrheumatic aortic (valve) stenosis: Secondary | ICD-10-CM | POA: Diagnosis not present

## 2017-05-24 DIAGNOSIS — R531 Weakness: Secondary | ICD-10-CM | POA: Diagnosis not present

## 2017-05-25 DIAGNOSIS — E43 Unspecified severe protein-calorie malnutrition: Secondary | ICD-10-CM | POA: Diagnosis not present

## 2017-05-25 DIAGNOSIS — Z8673 Personal history of transient ischemic attack (TIA), and cerebral infarction without residual deficits: Secondary | ICD-10-CM | POA: Diagnosis not present

## 2017-05-25 DIAGNOSIS — D509 Iron deficiency anemia, unspecified: Secondary | ICD-10-CM | POA: Diagnosis not present

## 2017-05-25 DIAGNOSIS — M6281 Muscle weakness (generalized): Secondary | ICD-10-CM | POA: Diagnosis not present

## 2017-05-25 DIAGNOSIS — I712 Thoracic aortic aneurysm, without rupture: Secondary | ICD-10-CM | POA: Diagnosis not present

## 2017-05-25 DIAGNOSIS — R262 Difficulty in walking, not elsewhere classified: Secondary | ICD-10-CM | POA: Diagnosis not present

## 2017-05-25 DIAGNOSIS — N390 Urinary tract infection, site not specified: Secondary | ICD-10-CM | POA: Diagnosis not present

## 2017-05-25 DIAGNOSIS — I480 Paroxysmal atrial fibrillation: Secondary | ICD-10-CM | POA: Diagnosis not present

## 2017-05-25 DIAGNOSIS — I350 Nonrheumatic aortic (valve) stenosis: Secondary | ICD-10-CM | POA: Diagnosis not present

## 2017-05-26 DIAGNOSIS — I480 Paroxysmal atrial fibrillation: Secondary | ICD-10-CM | POA: Diagnosis not present

## 2017-05-26 DIAGNOSIS — R262 Difficulty in walking, not elsewhere classified: Secondary | ICD-10-CM | POA: Diagnosis not present

## 2017-05-26 DIAGNOSIS — M6281 Muscle weakness (generalized): Secondary | ICD-10-CM | POA: Diagnosis not present

## 2017-05-26 DIAGNOSIS — N390 Urinary tract infection, site not specified: Secondary | ICD-10-CM | POA: Diagnosis not present

## 2017-05-26 DIAGNOSIS — Z8673 Personal history of transient ischemic attack (TIA), and cerebral infarction without residual deficits: Secondary | ICD-10-CM | POA: Diagnosis not present

## 2017-05-26 DIAGNOSIS — I712 Thoracic aortic aneurysm, without rupture: Secondary | ICD-10-CM | POA: Diagnosis not present

## 2017-05-26 DIAGNOSIS — D509 Iron deficiency anemia, unspecified: Secondary | ICD-10-CM | POA: Diagnosis not present

## 2017-05-26 DIAGNOSIS — I350 Nonrheumatic aortic (valve) stenosis: Secondary | ICD-10-CM | POA: Diagnosis not present

## 2017-05-26 DIAGNOSIS — E43 Unspecified severe protein-calorie malnutrition: Secondary | ICD-10-CM | POA: Diagnosis not present

## 2017-05-30 DIAGNOSIS — R262 Difficulty in walking, not elsewhere classified: Secondary | ICD-10-CM | POA: Diagnosis not present

## 2017-05-30 DIAGNOSIS — I350 Nonrheumatic aortic (valve) stenosis: Secondary | ICD-10-CM | POA: Diagnosis not present

## 2017-05-30 DIAGNOSIS — Z8673 Personal history of transient ischemic attack (TIA), and cerebral infarction without residual deficits: Secondary | ICD-10-CM | POA: Diagnosis not present

## 2017-05-30 DIAGNOSIS — D509 Iron deficiency anemia, unspecified: Secondary | ICD-10-CM | POA: Diagnosis not present

## 2017-05-30 DIAGNOSIS — E43 Unspecified severe protein-calorie malnutrition: Secondary | ICD-10-CM | POA: Diagnosis not present

## 2017-05-30 DIAGNOSIS — N390 Urinary tract infection, site not specified: Secondary | ICD-10-CM | POA: Diagnosis not present

## 2017-05-30 DIAGNOSIS — M6281 Muscle weakness (generalized): Secondary | ICD-10-CM | POA: Diagnosis not present

## 2017-05-30 DIAGNOSIS — I712 Thoracic aortic aneurysm, without rupture: Secondary | ICD-10-CM | POA: Diagnosis not present

## 2017-05-30 DIAGNOSIS — I480 Paroxysmal atrial fibrillation: Secondary | ICD-10-CM | POA: Diagnosis not present

## 2017-06-01 ENCOUNTER — Telehealth: Payer: Self-pay

## 2017-06-01 NOTE — Telephone Encounter (Signed)
Stacy PT with [facility] left v/m requesting cb; pt has vestibular vertigo this week and cannot do PT this week. Marzetta Board will be on vacation next wk and pt will not let substitute PT come out; Stacy request to make up 3 visits of [place] PT on her return from vacation; pt has to call when there is a plan of care frequency change.

## 2017-06-01 NOTE — Telephone Encounter (Signed)
Ok, agree with plan of care.

## 2017-06-02 NOTE — Telephone Encounter (Signed)
Left detailed message for [name] [name] on VM

## 2017-06-13 DIAGNOSIS — Z8673 Personal history of transient ischemic attack (TIA), and cerebral infarction without residual deficits: Secondary | ICD-10-CM | POA: Diagnosis not present

## 2017-06-13 DIAGNOSIS — D509 Iron deficiency anemia, unspecified: Secondary | ICD-10-CM | POA: Diagnosis not present

## 2017-06-13 DIAGNOSIS — M6281 Muscle weakness (generalized): Secondary | ICD-10-CM | POA: Diagnosis not present

## 2017-06-13 DIAGNOSIS — I350 Nonrheumatic aortic (valve) stenosis: Secondary | ICD-10-CM | POA: Diagnosis not present

## 2017-06-13 DIAGNOSIS — I712 Thoracic aortic aneurysm, without rupture: Secondary | ICD-10-CM | POA: Diagnosis not present

## 2017-06-13 DIAGNOSIS — I480 Paroxysmal atrial fibrillation: Secondary | ICD-10-CM | POA: Diagnosis not present

## 2017-06-13 DIAGNOSIS — E43 Unspecified severe protein-calorie malnutrition: Secondary | ICD-10-CM | POA: Diagnosis not present

## 2017-06-13 DIAGNOSIS — N390 Urinary tract infection, site not specified: Secondary | ICD-10-CM | POA: Diagnosis not present

## 2017-06-13 DIAGNOSIS — R262 Difficulty in walking, not elsewhere classified: Secondary | ICD-10-CM | POA: Diagnosis not present

## 2017-06-20 DIAGNOSIS — I480 Paroxysmal atrial fibrillation: Secondary | ICD-10-CM | POA: Diagnosis not present

## 2017-06-20 DIAGNOSIS — N390 Urinary tract infection, site not specified: Secondary | ICD-10-CM | POA: Diagnosis not present

## 2017-06-20 DIAGNOSIS — I712 Thoracic aortic aneurysm, without rupture: Secondary | ICD-10-CM | POA: Diagnosis not present

## 2017-06-20 DIAGNOSIS — M6281 Muscle weakness (generalized): Secondary | ICD-10-CM | POA: Diagnosis not present

## 2017-06-20 DIAGNOSIS — I350 Nonrheumatic aortic (valve) stenosis: Secondary | ICD-10-CM | POA: Diagnosis not present

## 2017-06-20 DIAGNOSIS — R262 Difficulty in walking, not elsewhere classified: Secondary | ICD-10-CM | POA: Diagnosis not present

## 2017-06-20 DIAGNOSIS — E43 Unspecified severe protein-calorie malnutrition: Secondary | ICD-10-CM | POA: Diagnosis not present

## 2017-06-20 DIAGNOSIS — D509 Iron deficiency anemia, unspecified: Secondary | ICD-10-CM | POA: Diagnosis not present

## 2017-06-20 DIAGNOSIS — Z8673 Personal history of transient ischemic attack (TIA), and cerebral infarction without residual deficits: Secondary | ICD-10-CM | POA: Diagnosis not present

## 2017-06-22 DIAGNOSIS — Z8673 Personal history of transient ischemic attack (TIA), and cerebral infarction without residual deficits: Secondary | ICD-10-CM | POA: Diagnosis not present

## 2017-06-22 DIAGNOSIS — M6281 Muscle weakness (generalized): Secondary | ICD-10-CM | POA: Diagnosis not present

## 2017-06-22 DIAGNOSIS — I350 Nonrheumatic aortic (valve) stenosis: Secondary | ICD-10-CM | POA: Diagnosis not present

## 2017-06-22 DIAGNOSIS — I712 Thoracic aortic aneurysm, without rupture: Secondary | ICD-10-CM | POA: Diagnosis not present

## 2017-06-22 DIAGNOSIS — N390 Urinary tract infection, site not specified: Secondary | ICD-10-CM | POA: Diagnosis not present

## 2017-06-22 DIAGNOSIS — R262 Difficulty in walking, not elsewhere classified: Secondary | ICD-10-CM | POA: Diagnosis not present

## 2017-06-22 DIAGNOSIS — I480 Paroxysmal atrial fibrillation: Secondary | ICD-10-CM | POA: Diagnosis not present

## 2017-06-22 DIAGNOSIS — E43 Unspecified severe protein-calorie malnutrition: Secondary | ICD-10-CM | POA: Diagnosis not present

## 2017-06-22 DIAGNOSIS — D509 Iron deficiency anemia, unspecified: Secondary | ICD-10-CM | POA: Diagnosis not present

## 2017-07-04 ENCOUNTER — Other Ambulatory Visit: Payer: Self-pay | Admitting: Family Medicine

## 2017-07-29 ENCOUNTER — Encounter (HOSPITAL_COMMUNITY): Payer: Self-pay | Admitting: Emergency Medicine

## 2017-07-29 ENCOUNTER — Inpatient Hospital Stay (HOSPITAL_COMMUNITY)
Admission: EM | Admit: 2017-07-29 | Discharge: 2017-08-03 | DRG: 871 | Disposition: A | Discharge status: Home Health | Payer: Medicare HMO | Attending: Internal Medicine | Admitting: Internal Medicine

## 2017-07-29 ENCOUNTER — Emergency Department (HOSPITAL_COMMUNITY): Payer: Medicare HMO

## 2017-07-29 DIAGNOSIS — I4891 Unspecified atrial fibrillation: Secondary | ICD-10-CM | POA: Diagnosis present

## 2017-07-29 DIAGNOSIS — D638 Anemia in other chronic diseases classified elsewhere: Secondary | ICD-10-CM | POA: Diagnosis present

## 2017-07-29 DIAGNOSIS — E43 Unspecified severe protein-calorie malnutrition: Secondary | ICD-10-CM | POA: Diagnosis present

## 2017-07-29 DIAGNOSIS — Z66 Do not resuscitate: Secondary | ICD-10-CM | POA: Diagnosis not present

## 2017-07-29 DIAGNOSIS — Z681 Body mass index (BMI) 19 or less, adult: Secondary | ICD-10-CM

## 2017-07-29 DIAGNOSIS — Z7901 Long term (current) use of anticoagulants: Secondary | ICD-10-CM

## 2017-07-29 DIAGNOSIS — I5022 Chronic systolic (congestive) heart failure: Secondary | ICD-10-CM | POA: Diagnosis present

## 2017-07-29 DIAGNOSIS — Z23 Encounter for immunization: Secondary | ICD-10-CM

## 2017-07-29 DIAGNOSIS — N390 Urinary tract infection, site not specified: Secondary | ICD-10-CM | POA: Diagnosis not present

## 2017-07-29 DIAGNOSIS — Z79899 Other long term (current) drug therapy: Secondary | ICD-10-CM | POA: Diagnosis not present

## 2017-07-29 DIAGNOSIS — R6521 Severe sepsis with septic shock: Secondary | ICD-10-CM | POA: Diagnosis not present

## 2017-07-29 DIAGNOSIS — R6520 Severe sepsis without septic shock: Secondary | ICD-10-CM | POA: Diagnosis present

## 2017-07-29 DIAGNOSIS — I350 Nonrheumatic aortic (valve) stenosis: Secondary | ICD-10-CM | POA: Diagnosis present

## 2017-07-29 DIAGNOSIS — I110 Hypertensive heart disease with heart failure: Secondary | ICD-10-CM | POA: Diagnosis present

## 2017-07-29 DIAGNOSIS — L89152 Pressure ulcer of sacral region, stage 2: Secondary | ICD-10-CM | POA: Diagnosis present

## 2017-07-29 DIAGNOSIS — E039 Hypothyroidism, unspecified: Secondary | ICD-10-CM | POA: Diagnosis present

## 2017-07-29 DIAGNOSIS — Z8711 Personal history of peptic ulcer disease: Secondary | ICD-10-CM | POA: Diagnosis not present

## 2017-07-29 DIAGNOSIS — Z8673 Personal history of transient ischemic attack (TIA), and cerebral infarction without residual deficits: Secondary | ICD-10-CM

## 2017-07-29 DIAGNOSIS — E860 Dehydration: Secondary | ICD-10-CM | POA: Diagnosis present

## 2017-07-29 DIAGNOSIS — D649 Anemia, unspecified: Secondary | ICD-10-CM | POA: Diagnosis not present

## 2017-07-29 DIAGNOSIS — G9341 Metabolic encephalopathy: Secondary | ICD-10-CM | POA: Diagnosis present

## 2017-07-29 DIAGNOSIS — A419 Sepsis, unspecified organism: Secondary | ICD-10-CM | POA: Diagnosis not present

## 2017-07-29 DIAGNOSIS — I714 Abdominal aortic aneurysm, without rupture: Secondary | ICD-10-CM | POA: Diagnosis present

## 2017-07-29 DIAGNOSIS — Z85828 Personal history of other malignant neoplasm of skin: Secondary | ICD-10-CM | POA: Diagnosis not present

## 2017-07-29 DIAGNOSIS — I10 Essential (primary) hypertension: Secondary | ICD-10-CM | POA: Diagnosis present

## 2017-07-29 DIAGNOSIS — R531 Weakness: Secondary | ICD-10-CM | POA: Diagnosis not present

## 2017-07-29 DIAGNOSIS — I482 Chronic atrial fibrillation: Secondary | ICD-10-CM | POA: Diagnosis not present

## 2017-07-29 DIAGNOSIS — R404 Transient alteration of awareness: Secondary | ICD-10-CM | POA: Diagnosis not present

## 2017-07-29 DIAGNOSIS — E876 Hypokalemia: Secondary | ICD-10-CM | POA: Diagnosis not present

## 2017-07-29 DIAGNOSIS — J189 Pneumonia, unspecified organism: Secondary | ICD-10-CM | POA: Diagnosis not present

## 2017-07-29 DIAGNOSIS — L8990 Pressure ulcer of unspecified site, unspecified stage: Secondary | ICD-10-CM | POA: Insufficient documentation

## 2017-07-29 DIAGNOSIS — J449 Chronic obstructive pulmonary disease, unspecified: Secondary | ICD-10-CM | POA: Diagnosis present

## 2017-07-29 LAB — COMPREHENSIVE METABOLIC PANEL
ALBUMIN: 2.4 g/dL — AB (ref 3.5–5.0)
ALT: 99 U/L — AB (ref 14–54)
AST: 226 U/L — AB (ref 15–41)
Alkaline Phosphatase: 140 U/L — ABNORMAL HIGH (ref 38–126)
Anion gap: 7 (ref 5–15)
BUN: 19 mg/dL (ref 6–20)
CHLORIDE: 113 mmol/L — AB (ref 101–111)
CO2: 21 mmol/L — AB (ref 22–32)
CREATININE: 0.83 mg/dL (ref 0.44–1.00)
Calcium: 7.7 mg/dL — ABNORMAL LOW (ref 8.9–10.3)
GFR calc Af Amer: >60 mL/min (ref >60)
GFR calc non Af Amer: >60 mL/min (ref >60)
Glucose, Bld: 82 mg/dL (ref 65–99)
POTASSIUM: 3.8 mmol/L (ref 3.5–5.1)
SODIUM: 141 mmol/L (ref 135–145)
Total Bilirubin: 0.8 mg/dL (ref 0.3–1.2)
Total Protein: 5.1 g/dL — ABNORMAL LOW (ref 6.5–8.1)

## 2017-07-29 LAB — URINALYSIS, ROUTINE W REFLEX MICROSCOPIC
BILIRUBIN URINE: NEGATIVE
GLUCOSE, UA: NEGATIVE mg/dL
KETONES UR: NEGATIVE mg/dL
Leukocytes, UA: NEGATIVE
Nitrite: NEGATIVE
PH: 5 (ref 5.0–8.0)
PROTEIN: 30 mg/dL — AB
Specific Gravity, Urine: 1.016 (ref 1.005–1.030)

## 2017-07-29 LAB — I-STAT CG4 LACTIC ACID, ED
LACTIC ACID, VENOUS: 2.65 mmol/L — AB (ref 0.5–1.9)
Lactic Acid, Venous: 2.13 mmol/L (ref 0.5–1.9)

## 2017-07-29 LAB — CBC
HCT: 34 % — ABNORMAL LOW (ref 36.0–46.0)
Hemoglobin: 10.4 g/dL — ABNORMAL LOW (ref 12.0–15.0)
MCH: 29.8 pg (ref 26.0–34.0)
MCHC: 30.6 g/dL (ref 30.0–36.0)
MCV: 97.4 fL (ref 78.0–100.0)
PLATELETS: 154 10*3/uL (ref 150–400)
RBC: 3.49 MIL/uL — ABNORMAL LOW (ref 3.87–5.11)
RDW: 16.7 % — AB (ref 11.5–15.5)
WBC: 19.4 10*3/uL — AB (ref 4.0–10.5)

## 2017-07-29 LAB — I-STAT TROPONIN, ED: TROPONIN I, POC: 0 ng/mL (ref 0.00–0.08)

## 2017-07-29 LAB — BRAIN NATRIURETIC PEPTIDE: B Natriuretic Peptide: 1156.6 pg/mL — ABNORMAL HIGH (ref 0.0–100.0)

## 2017-07-29 MED ORDER — SODIUM CHLORIDE 0.9 % IV BOLUS (SEPSIS)
500.0000 mL | Freq: Once | INTRAVENOUS | Status: AC
  Administered 2017-07-29: 500 mL via INTRAVENOUS

## 2017-07-29 MED ORDER — LEVOFLOXACIN IN D5W 500 MG/100ML IV SOLN
500.0000 mg | Freq: Once | INTRAVENOUS | Status: AC
  Administered 2017-07-29: 500 mg via INTRAVENOUS
  Filled 2017-07-29: qty 100

## 2017-07-29 MED ORDER — VANCOMYCIN HCL IN DEXTROSE 1-5 GM/200ML-% IV SOLN
1000.0000 mg | Freq: Once | INTRAVENOUS | Status: AC
  Administered 2017-07-29: 1000 mg via INTRAVENOUS
  Filled 2017-07-29: qty 200

## 2017-07-29 MED ORDER — SODIUM CHLORIDE 0.9 % IV SOLN
INTRAVENOUS | Status: DC
  Administered 2017-07-29: 17:00:00 via INTRAVENOUS

## 2017-07-29 MED ORDER — HYDROCORTISONE NA SUCCINATE PF 100 MG IJ SOLR
100.0000 mg | Freq: Once | INTRAMUSCULAR | Status: AC
  Administered 2017-07-29: 100 mg via INTRAVENOUS
  Filled 2017-07-29: qty 2

## 2017-07-29 MED ORDER — ACETAMINOPHEN 325 MG PO TABS
650.0000 mg | ORAL_TABLET | Freq: Once | ORAL | Status: AC
  Administered 2017-07-29: 650 mg via ORAL
  Filled 2017-07-29: qty 2

## 2017-07-29 MED ORDER — STERILE WATER FOR INJECTION IJ SOLN
INTRAMUSCULAR | Status: AC
  Administered 2017-07-29: 10 mL
  Filled 2017-07-29: qty 10

## 2017-07-29 MED ORDER — DEXTROSE 5 % IV SOLN
1.0000 g | Freq: Once | INTRAVENOUS | Status: AC
  Administered 2017-07-29: 1 g via INTRAVENOUS
  Filled 2017-07-29: qty 10

## 2017-07-29 NOTE — ED Triage Notes (Signed)
Pt BIB EMS from home for AMS. Per family, pt had gotten up with walker to use the restroom several times and around 0930, she got "winded and had chills" and wasn't at her baseline self answering questions. Pt A&Ox4 on ED arrival; no facial droop or arm weakness. Pt denies pain; per family, pt was c/o rt sided lower abdominal pain. Resp e/u; NAD at this time.

## 2017-07-29 NOTE — ED Notes (Signed)
Attempted PIV x 2. Not successful. Phleb at bedside to collect blood cultures.

## 2017-07-29 NOTE — ED Provider Notes (Signed)
[facility] DEPT Provider Note   CSN: [id number] Arrival date & time: 07/29/17  1340    History   Chief Complaint Chief Complaint  Patient presents with  . Altered Mental Status    HPI [name] [name] is a 83 y.o. female with history of recent urosepsis 2/2 Pseudomonas, new onset afib on Xarelto, AAA declined surgery, aortic stenosis, COPD,  HTN, HFrEF EF 40%, and CVA who presents to the ED with altered mental status. Patient is accompanied by 2 daughters and 1 son. They state that patient has not been herself since this morning at around 9am and she has not been interacting as usual with other family members. Family states her urine is darker than usual and she has been having increased urinary frequency. Patient denies this and reports no urinary symptoms. Per family, she is short of breath at baseline but SOB seemed to worsen today on exertion. Of note, patient was recently admitted in 04/2017 for urosepsis secondary to Pseudomonas and developed new onset atrial fibrillation during admission. Denies fever, but family reports she has been she read all morning and complaining of being cold. She also endorses generalized weakness that is chronic in nature, and nausea and decreased appetite that started today, but good PO intake prior to this. No changes in bowel movement.  Of note, she is a hospice patient and would like limited interventions. She is agreeable to blood workup, IVF, and IV antibiotics. She is also agreeable to admission if needed. Would not like any kind of imaging.  HPI  Past Medical History:  Diagnosis Date  . Aortic stenosis    a. EF of 4045%, normal wall motion, moderate concentric LHV, GR1DD, aortic valve was severely thickened and severely calcified with severe aortic stenosis with a mean gradient of 39 mmHg, VTI 0.86 cm^2, Vmax 0.76 cm^2. There was mild AI. The aortic root was mildly dilated at 45 mm and the ascending aorta was dilated at 50 mm. There was trivial MR.  RV systolic fxn normal. Mild TR/PR. PASP nl  . B12 deficiency anemia    s/p Billroth 2  . Carcinoma, basal cell, skin   . Chronic systolic CHF (congestive heart failure) (HCC)    a. see echo in pmh  . COPD (chronic obstructive pulmonary disease) ([place])   . CVA (cerebral vascular accident) ([place]) 10/26/2016  . Diplopia   . Diverticular disease   . Hypertension    Unspecified  . Malnutrition ([place])   . Physical deconditioning   . PUD (peptic ulcer disease)   . Renal calculus   . Stroke ([place]) 10/2016  . Thoracic aortic aneurysm (HCC)    7 cm?  Marland Kitchen TIA 06/01/2007   Qualifier: Diagnosis of  By: Maxie Better FNP, Rosalita Levan   . TIA (transient ischemic attack) 2010    Patient Active Problem List   Diagnosis Date Noted  . Delirium 04/25/2017  . Pseudomonas sepsis ([place]) 04/25/2017  . H/O: CVA (cerebrovascular accident) 04/25/2017  . Severe aortic stenosis 04/23/2017  . New onset atrial fibrillation ([place]) 04/23/2017  . AKI (acute kidney injury) ([place]) 04/23/2017  . Severe sepsis ([place]) 04/22/2017  . UTI (urinary tract infection) 04/22/2017  . B12 deficiency anemia 11/10/2016  . Elevated troponin 10/27/2016  . Protein-calorie malnutrition, severe ([place]) 10/27/2016  . Osteoarthritis of lumbar spine 10/07/2011  . Essential hypertension 01/12/2009  . Aneurysm of thoracic aorta ([place]) 01/12/2009  . CARCINOMA, BASAL CELL, HX OF 11/07/2008  . RENAL CALCULUS, HX OF 11/07/2008  . Iron deficiency anemia  11/04/2008  . COPD 10/17/2002  . DIVERTICULAR DISEASE 10/17/1986    Past Surgical History:  Procedure Laterality Date  . BACK SURGERY  1986  . BASAL CELL CARCINOMA EXCISION    . Chest CT  10/21/2002   Ascending aneurysm; 5.5 X 5.5  . HERNIA REPAIR    . PARTIAL HYSTERECTOMY     for Fibroids  . STOMACH SURGERY  1968   X2 Billroth II; Vagotomysubtotal gastric/ gastrectomy (1989)  . TUBAL LIGATION      OB History    No data available       Home Medications    Prior to Admission  medications   Medication Sig Start Date End Date Taking? Authorizing Provider  apixaban (ELIQUIS) 2.5 MG TABS tablet Take 1 tablet (2.5 mg total) by mouth 2 (two) times daily. 05/16/17   Copland, Frederico Hamman, MD  Aspirin-Caffeine (ANACIN) 400-32 MG TABS Take 2 tablets by mouth daily as needed (eadache).     [provider]  atorvastatin (LIPITOR) 10 MG tablet Take 1 tablet (10 mg total) by mouth daily. 11/09/16   Copland, Frederico Hamman, MD  ciprofloxacin (CIPRO) 500 MG tablet Take 1 tablet (500 mg total) by mouth daily. 04/29/17   Patrecia Pour, MD  cyanocobalamin (,VITAMIN B-12,) 1000 MCG/ML injection INJECT 1 ML INTERMUSCLUARLLY ONCE A MONTH. Patient taking differently: Inject 1,000 mcg into the muscle every 30 (thirty) days.  11/09/16   Copland, Frederico Hamman, MD  levothyroxine (SYNTHROID, LEVOTHROID) 25 MCG tablet Take 1 tablet (25 mcg total) by mouth daily before breakfast. 11/09/16   Copland, Frederico Hamman, MD  metoprolol tartrate (LOPRESSOR) 25 MG tablet Take 0.5 tablets (12.5 mg total) by mouth 2 (two) times daily. 11/16/16   Copland, Frederico Hamman, MD  Multiple Vitamin (MULTIVITAMIN WITH MINERALS) TABS tablet Take 1 tablet by mouth daily.    [provider]  Multiple Vitamins-Minerals (PRESERVISION AREDS 2) CAPS Take 1 capsule by mouth 2 (two) times daily.    [provider]  nystatin cream (MYCOSTATIN) APPLY 1 APPLICATION TOPICALLY 2 (TWO) TIMES DAILY Patient taking differently: Apply 1 application topically 2 (two) times daily as needed.  05/12/16   Copland, Frederico Hamman, MD  oxybutynin (DITROPAN) 5 MG tablet TAKE 1 TABLET TWICE DAILY 07/05/17   Copland, Frederico Hamman, MD    Family History Family History  Problem Relation Age of Onset  . Cholecystitis Mother 51  . Stroke Father 71  . Cancer Maternal Aunt        facial CA  . Heart attack Neg Hx   . Other Neg Hx        CV/HBP  . Hypertension Neg Hx     Social History Social History  Substance Use Topics  . Smoking status: Never Smoker  .  Smokeless tobacco: Never Used  . Alcohol use No     Allergies   Patient has no known allergies.   Review of Systems Review of Systems  Constitutional: Positive for appetite change and chills. Negative for fever.  Respiratory: Negative for cough, chest tightness and shortness of breath.   Cardiovascular: Positive for leg swelling. Negative for chest pain and palpitations.  Gastrointestinal: Positive for nausea. Negative for abdominal pain, constipation, diarrhea and vomiting.     Physical Exam Updated Vital Signs BP (!) 82/57 (BP Location: Right Arm) Comment: Simultaneous filing. User may not have seen previous data.  Pulse 95 Comment: Simultaneous filing. User may not have seen previous data.  Temp (!) 100.7 F (38.2 C) (Rectal)   Resp (!) 24 Comment: Simultaneous  filing. User may not have seen previous data.  Ht 5' (1.524 m)   Wt 45.4 kg (100 lb)   SpO2 96% Comment: Simultaneous filing. User may not have seen previous data.  BMI 19.53 kg/m   Physical Exam General: very pleasant female, appears chronically ill, looks malnourished, in no acute distress, able to speak in full sentences  Heart: irregularly irregular, nl S1/S2, S4 noted, no mrg  Lungs: CTAB, no wheezes or crackles, no increased WOB  Abd: soft, NTND, normoactive bowel sounds  Neuro: A&Ox4, CN II-XII intact, generally weak but able to move all 4 extremities,  Ext: warm and well perfused, 1+ pitting edema of LLE (chronic)  ED Treatments / Results  Labs (all labs ordered are listed, but only abnormal results are displayed) Labs Reviewed  URINE CULTURE  CBC  COMPREHENSIVE METABOLIC PANEL  URINALYSIS, ROUTINE W REFLEX MICROSCOPIC  BRAIN NATRIURETIC PEPTIDE  I-STAT CG4 LACTIC ACID, ED  I-STAT TROPONIN, ED    EKG  EKG Interpretation  Date/Time:  Saturday July 29 2017 13:55:55 EDT Ventricular Rate:  112 PR Interval:    QRS Duration: 83 QT Interval:  333 QTC Calculation: 455 R Axis:   -11 Text  Interpretation:  Atrial fibrillation Abnormal R-wave progression, early transition Borderline repolarization abnormality Confirmed by Blanchie Dessert [phone]) on 07/29/2017 2:08:50 PM       Radiology No results found.  Procedures Procedures (including critical care time)  Medications Ordered in ED Medications  sodium chloride 0.9 % bolus 500 mL (500 mLs Intravenous New Bag/Given 07/29/17 1440)  acetaminophen (TYLENOL) tablet 650 mg (not administered)     Initial Impression / Assessment and Plan / ED Course  I have reviewed the triage vital signs and the nursing notes.  Pertinent labs & imaging results that were available during my care of the patient were reviewed by me and considered in my medical decision making (see chart for details).    [name] [name] is a 83 y.o. female with history of recent urosepsis 2/2 Pseudomonas, new onset afib on Xarelto, AAA declined surgery, aortic stenosis, COPD,  HTN, HFrEF EF 40%, and CVA who presents to the ED with altered mental status. Patient is currently alert and oriented 4. She is able to voice current symptoms/complaints and able to explain previous hospitalization in July. Do not believe patient is altered at this time, but concern for sepsis as she is febrile with T 100.6 and hypotensive 80s/50s. CBC, CMP, UA, and Ucx ordered. 500 mL NS bolus given. Will wait for workup, but will likely need admission for IV antibiotics.    Final Clinical Impressions(s) / ED Diagnoses   Final diagnoses:  None    New Prescriptions New Prescriptions   No medications on file     Welford Roche, MD 07/29/17 1542    Blanchie Dessert, MD 07/30/17 [phone]

## 2017-07-29 NOTE — H&P (Addendum)
History and Physical    [name] [name] SAY:[id number] DOB: 1925-11-01 DOA: 07/29/2017  Referring MD/NP/PA:Dr. Julianne Rice PCP: Owens Loffler, MD  Patient coming from: via EMS  Chief Complaint: Chills  HPI: [name] [name] is a 83 y.o. female with medical history significant of HTN, sCHF Last EF 40-50%, severe AS,  Afib on Eliquis, AAA, CVA, COPD; who presents with complaint of chills. The patient and family  members help provide history. At baseline patient is active and lives at home with 24-hour care. Patient reportedly had gotten up to use the restroom a few more times than usual overnight. This morning and noticed that her urine was dark colored. Patient had reportedly not been acting likeher normal self since early this morning. This afternoon she had gotten winded with acute onset of chills. Family makes note that the patient was also complaining of some right-sided abdominal pain. Associated symptoms include patient reporting intermittently get strangled when drinking, decreased appetite, generalized weakness, mild nausea, and reports hemorrhoids with some blood with wiping. Denies any shortness of breath, cough, vomiting, or chest pain. Patient was last admitted into the hospital in July with sepsis secondary to urinary tract infection which was found to be positive for Pseudomonas. Review of previous cultures show that Pseudomonas was pansensitive.  ED Course: Upon admission into the emergency department patient was found have a temperature of 100.38F, pulse 80-95, respirations 16-26, blood pressure as low as 69/49-85/59. Labs revealed WBC 19.4,  lactic acid trending up to 2.65, and BNP 1000. UA was abnormal and the chest x-ray showed possible left basilar opacity.Patient was given approximately 2 .5 L of normal saline IV fluid in total since being in the emergency department.empiric antibiotics have been given of Rocephin. Vancomycin and Levaquinor added later due to possibility of a  pneumonia.  Patient is noted to be a DNR but ok with pressors and bipap if needed.  Review of Systems  Constitutional: Positive for chills and malaise/fatigue.  HENT: Positive for hearing loss. Negative for ear discharge.   Eyes: Negative for pain and discharge.  Respiratory: Negative for cough, sputum production and shortness of breath.   Cardiovascular: Negative for chest pain and leg swelling.  Gastrointestinal: Positive for abdominal pain and nausea. Negative for vomiting.  Genitourinary: Positive for flank pain and frequency. Negative for dysuria.  Musculoskeletal: Positive for back pain. Negative for falls.  Skin: Negative for itching and rash.  Neurological: Positive for weakness. Negative for tremors and sensory change.  Psychiatric/Behavioral: Negative for substance abuse. The patient does not have insomnia.     Past Medical History:  Diagnosis Date  . Aortic stenosis    a. EF of 4045%, normal wall motion, moderate concentric LHV, GR1DD, aortic valve was severely thickened and severely calcified with severe aortic stenosis with a mean gradient of 39 mmHg, VTI 0.86 cm^2, Vmax 0.76 cm^2. There was mild AI. The aortic root was mildly dilated at 45 mm and the ascending aorta was dilated at 50 mm. There was trivial MR. RV systolic fxn normal. Mild TR/PR. PASP nl  . B12 deficiency anemia    s/p Billroth 2  . Carcinoma, basal cell, skin   . Chronic systolic CHF (congestive heart failure) (HCC)    a. see echo in pmh  . COPD (chronic obstructive pulmonary disease) ([place])   . CVA (cerebral vascular accident) ([place]) 10/26/2016  . Diplopia   . Diverticular disease   . Hypertension    Unspecified  . Malnutrition ([place])   . Physical  deconditioning   . PUD (peptic ulcer disease)   . Renal calculus   . Stroke ([place]) 10/2016  . Thoracic aortic aneurysm (HCC)    7 cm?  Marland Kitchen TIA 06/01/2007   Qualifier: Diagnosis of  By: Maxie Better FNP, Rosalita Levan   . TIA (transient ischemic attack) 2010     Past Surgical History:  Procedure Laterality Date  . BACK SURGERY  1986  . BASAL CELL CARCINOMA EXCISION    . Chest CT  10/21/2002   Ascending aneurysm; 5.5 X 5.5  . HERNIA REPAIR    . PARTIAL HYSTERECTOMY     for Fibroids  . STOMACH SURGERY  1968   X2 Billroth II; Vagotomysubtotal gastric/ gastrectomy (1989)  . TUBAL LIGATION       reports that she has never smoked. She has never used smokeless tobacco. She reports that she does not drink alcohol or use drugs.  No Known Allergies  Family History  Problem Relation Age of Onset  . Cholecystitis Mother 80  . Stroke Father 22  . Cancer Maternal Aunt        facial CA  . Heart attack Neg Hx   . Other Neg Hx        CV/HBP  . Hypertension Neg Hx     Prior to Admission medications   Medication Sig Start Date End Date Taking? Authorizing Provider  apixaban (ELIQUIS) 2.5 MG TABS tablet Take 1 tablet (2.5 mg total) by mouth 2 (two) times daily. 05/16/17  Yes Copland, Frederico Hamman, MD  Aspirin-Caffeine (ANACIN) 400-32 MG TABS Take 2 tablets by mouth daily as needed (eadache).    Yes [provider]  atorvastatin (LIPITOR) 10 MG tablet Take 1 tablet (10 mg total) by mouth daily. 11/09/16  Yes Copland, Frederico Hamman, MD  cyanocobalamin (,VITAMIN B-12,) 1000 MCG/ML injection INJECT 1 ML INTERMUSCLUARLLY ONCE A MONTH. Patient taking differently: Inject 1,000 mcg into the muscle every 30 (thirty) days.  11/09/16  Yes Copland, Frederico Hamman, MD  levothyroxine (SYNTHROID, LEVOTHROID) 25 MCG tablet Take 1 tablet (25 mcg total) by mouth daily before breakfast. 11/09/16  Yes Copland, Frederico Hamman, MD  metoprolol tartrate (LOPRESSOR) 25 MG tablet Take 0.5 tablets (12.5 mg total) by mouth 2 (two) times daily. 11/16/16  Yes Copland, Frederico Hamman, MD  Multiple Vitamin (MULTIVITAMIN WITH MINERALS) TABS tablet Take 1 tablet by mouth daily.   Yes [provider]  Multiple Vitamins-Minerals (PRESERVISION AREDS 2) CAPS Take 1 capsule by mouth 2 (two) times daily.    Yes [provider]  nystatin cream (MYCOSTATIN) APPLY 1 APPLICATION TOPICALLY 2 (TWO) TIMES DAILY Patient taking differently: Apply 1 application topically 2 (two) times daily as needed.  05/12/16  Yes Copland, Frederico Hamman, MD  oxybutynin (DITROPAN) 5 MG tablet TAKE 1 TABLET TWICE DAILY 07/05/17  Yes Copland, Frederico Hamman, MD  ciprofloxacin (CIPRO) 500 MG tablet Take 1 tablet (500 mg total) by mouth daily. Patient not taking: Reported on 07/29/2017 04/29/17   Patrecia Pour, MD    Physical Exam:  Constitutional: Elderly female who appear sick, but is alert. Vitals:   07/29/17 2300 07/29/17 2315 07/29/17 2330 07/29/17 2345  BP: (!) 71/50 (!) 73/50 (!) 73/50 (!) 72/50  Pulse: 84 85 84 84  Resp: 19 19 (!) 21 16  Temp:      TempSrc:      SpO2: 97% 97% 97% 97%  Weight:      Height:       Eyes: PERRL, lids and conjunctivae normal ENMT: Mucous membranes are moist. Posterior  pharynx clear of any exudate or lesions.  Neck: normal, supple, no masses, no thyromegaly Respiratory:  Normal respiratory effort with bibasilar crackles. No accessory muscle use.  Cardiovascular: Regular rate and rhythm, Positive systolic murmur. No rubs / gallops. No extremity edema. 2+ pedal pulses. No carotid bruits.  Abdomen: no tenderness, no masses palpated. No hepatosplenomegaly. Bowel sounds positive.  Musculoskeletal: no clubbing / cyanosis. No joint deformity upper and lower extremities. Good ROM, no contractures. Normal muscle tone.  Skin: no rashes, lesions, ulcers. No induration Neurologic: CN 2-12 grossly intact. Sensation intact, DTR normal. Strength 5/5 in all 4.  Psychiatric: Normal judgment and insight. Alert and oriented x 3. Normal mood.     Labs on Admission: I have personally reviewed following labs and imaging studies  CBC:  Recent Labs Lab 07/29/17 1522  WBC 19.4*  HGB 10.4*  HCT 34.0*  MCV 97.4  PLT 027   Basic Metabolic Panel:  Recent Labs Lab 07/29/17 1522  NA 141  K 3.8  CL  113*  CO2 21*  GLUCOSE 82  BUN 19  CREATININE 0.83  CALCIUM 7.7*   GFR: Estimated Creatinine Clearance: 32.9 mL/min (by C-G formula based on SCr of 0.83 mg/dL). Liver Function Tests:  Recent Labs Lab 07/29/17 1522  AST 226*  ALT 99*  ALKPHOS 140*  BILITOT 0.8  PROT 5.1*  ALBUMIN 2.4*   No results for input(s): LIPASE, AMYLASE in the last 168 hours. No results for input(s): AMMONIA in the last 168 hours. Coagulation Profile: No results for input(s): INR, PROTIME in the last 168 hours. Cardiac Enzymes: No results for input(s): CKTOTAL, CKMB, CKMBINDEX, TROPONINI in the last 168 hours. BNP (last 3 results) No results for input(s): PROBNP in the last 8760 hours. HbA1C: No results for input(s): HGBA1C in the last 72 hours. CBG: No results for input(s): GLUCAP in the last 168 hours. Lipid Profile: No results for input(s): CHOL, HDL, LDLCALC, TRIG, CHOLHDL, LDLDIRECT in the last 72 hours. Thyroid Function Tests: No results for input(s): TSH, T4TOTAL, FREET4, T3FREE, THYROIDAB in the last 72 hours. Anemia Panel: No results for input(s): VITAMINB12, FOLATE, FERRITIN, TIBC, IRON, RETICCTPCT in the last 72 hours. Urine analysis:    Component Value Date/Time   COLORURINE AMBER (A) 07/29/2017 1935   APPEARANCEUR HAZY (A) 07/29/2017 1935   LABSPEC 1.016 07/29/2017 1935   PHURINE 5.0 07/29/2017 1935   GLUCOSEU NEGATIVE 07/29/2017 1935   GLUCOSEU NEGATIVE 05/08/2017 1147   HGBUR LARGE (A) 07/29/2017 1935   BILIRUBINUR NEGATIVE 07/29/2017 1935   BILIRUBINUR negative 11/22/2016 [place] 07/29/2017 1935   PROTEINUR 30 (A) 07/29/2017 1935   UROBILINOGEN 0.2 05/08/2017 1147   NITRITE NEGATIVE 07/29/2017 1935   LEUKOCYTESUR NEGATIVE 07/29/2017 1935   Sepsis Labs: No results found for this or any previous visit (from the past 240 hour(s)).   Radiological Exams on Admission: Dg Chest Port 1 View  Result Date: 07/29/2017 CLINICAL DATA:  83 y/o  F; sepsis. EXAM:  PORTABLE CHEST 1 VIEW COMPARISON:  04/22/2017 chest radiograph FINDINGS: Stable mildly enlarged cardiac silhouette. Aortic atherosclerosis with calcification. Elevated left hemidiaphragm and left basilar opacity probably representing associated atelectasis. Pneumonia is possible. No pleural effusion or pneumothorax. Mild dextrocurvature of the thoracic spine. IMPRESSION: Left basilar opacity may represent pneumonia or atelectasis associated with elevated left hemidiaphragm. Electronically Signed   By: Kristine Garbe M.D.   On: 07/29/2017 18:56    EKG: Independently reviewed. afib  Assessment/Plan Septic shock 2/2 UTI and/or pneumonia :  patient presents with reports of using the bathroom multiple times and being altered. Fever noted up to 100.7, hypotension, WBC 19.4,and lactic acid 2.69. Having to be gentle with fluids as patient is DO NOT RESUSCITATE and has history of CHF. Patient initially given Rocephin for suspected UTI and subsequently vancomycin and Levaquin added. Pansensitive pseudomonas UTIs in the past. Patient denied cough or sputum production. Was given Hydrocortisone for bp in ED as well. - admit to stepdown bed - follow-ups blood, sputum, and urine cultures - add on pro-calcitonin - Continue empiric antibiotics of  cefepime. De-escalate when medically appropriate. - Iv fluids of NS as tolerated - Tylenol prn fever - Patient ok with Pressors if needed and Bipap - PCCM consulted overnight but recommended additional fluids and monitor for now.  Acute encephalopathy: mental status improved since admission. Symptoms could be secondary to infection. - neuro checks  Hypotension 2/2 dehydration w/ H/O HTN - Hold metoprolol   - IV fluids as tolerated  Afib on anticoagulation - continue eliquis  SystolicCHF with severe aortic stenosis: last EF noted to be40-50% with severe aortic stenosis. - Strict I&Os with daily weight  Anemia  - recheck CBC in a.m.  Hypothyroidism -  Check TSH - Continue levothyroxine   DVT prophylaxis: Eliquis Code Status: DNR  Family Communication: discussed with patient and familu Disposition Plan: tbd Consults called: PCCM Admission status: Inpatient  Norval Morton MD [facility] Pager [phone]   If 7PM-7AM, please contact night-coverage [email or web address] Password [email or web address]  07/29/2017, 11:57 PM

## 2017-07-29 NOTE — ED Notes (Signed)
Portable xray at bedside.

## 2017-07-29 NOTE — ED Provider Notes (Signed)
Patient with extensive history of recent urosepsis, AAA, COPD, CHF. Patient is a hospice patient and only wants limited intervention. Initially refusing any imaging. IV Rocephin and IV fluids was initiated by initial treating team due to concern for urinary sepsis.  Patient thought there continues to be low despite IV fluids. Maintaining adequate mentation. Discuss with hospitalist initially but advised getting chest x-ray and UA prior to admission. Patient consented to x-ray which shows infiltrate in the left base. UA with likely persistent infection. Antibiotics were broadened. Dr. Tamala Julian will see patient in emergency department and admit to step down bed.   Julianne Rice, MD 07/29/17 2158

## 2017-07-30 DIAGNOSIS — A419 Sepsis, unspecified organism: Secondary | ICD-10-CM | POA: Diagnosis present

## 2017-07-30 DIAGNOSIS — G9341 Metabolic encephalopathy: Secondary | ICD-10-CM | POA: Diagnosis present

## 2017-07-30 DIAGNOSIS — L8990 Pressure ulcer of unspecified site, unspecified stage: Secondary | ICD-10-CM | POA: Insufficient documentation

## 2017-07-30 DIAGNOSIS — Z8711 Personal history of peptic ulcer disease: Secondary | ICD-10-CM | POA: Diagnosis not present

## 2017-07-30 DIAGNOSIS — I482 Chronic atrial fibrillation: Secondary | ICD-10-CM | POA: Diagnosis not present

## 2017-07-30 DIAGNOSIS — I350 Nonrheumatic aortic (valve) stenosis: Secondary | ICD-10-CM | POA: Diagnosis present

## 2017-07-30 DIAGNOSIS — I4891 Unspecified atrial fibrillation: Secondary | ICD-10-CM | POA: Diagnosis present

## 2017-07-30 DIAGNOSIS — Z66 Do not resuscitate: Secondary | ICD-10-CM | POA: Diagnosis present

## 2017-07-30 DIAGNOSIS — I714 Abdominal aortic aneurysm, without rupture: Secondary | ICD-10-CM | POA: Diagnosis present

## 2017-07-30 DIAGNOSIS — I5022 Chronic systolic (congestive) heart failure: Secondary | ICD-10-CM

## 2017-07-30 DIAGNOSIS — Z23 Encounter for immunization: Secondary | ICD-10-CM | POA: Diagnosis present

## 2017-07-30 DIAGNOSIS — E876 Hypokalemia: Secondary | ICD-10-CM | POA: Diagnosis not present

## 2017-07-30 DIAGNOSIS — E039 Hypothyroidism, unspecified: Secondary | ICD-10-CM | POA: Diagnosis present

## 2017-07-30 DIAGNOSIS — Z681 Body mass index (BMI) 19 or less, adult: Secondary | ICD-10-CM | POA: Diagnosis not present

## 2017-07-30 DIAGNOSIS — Z7901 Long term (current) use of anticoagulants: Secondary | ICD-10-CM | POA: Diagnosis not present

## 2017-07-30 DIAGNOSIS — R6520 Severe sepsis without septic shock: Secondary | ICD-10-CM | POA: Diagnosis present

## 2017-07-30 DIAGNOSIS — I110 Hypertensive heart disease with heart failure: Secondary | ICD-10-CM | POA: Diagnosis present

## 2017-07-30 DIAGNOSIS — J189 Pneumonia, unspecified organism: Secondary | ICD-10-CM | POA: Diagnosis present

## 2017-07-30 DIAGNOSIS — D638 Anemia in other chronic diseases classified elsewhere: Secondary | ICD-10-CM | POA: Diagnosis present

## 2017-07-30 DIAGNOSIS — I10 Essential (primary) hypertension: Secondary | ICD-10-CM | POA: Diagnosis not present

## 2017-07-30 DIAGNOSIS — N390 Urinary tract infection, site not specified: Secondary | ICD-10-CM | POA: Diagnosis present

## 2017-07-30 DIAGNOSIS — E860 Dehydration: Secondary | ICD-10-CM | POA: Diagnosis present

## 2017-07-30 DIAGNOSIS — Z79899 Other long term (current) drug therapy: Secondary | ICD-10-CM | POA: Diagnosis not present

## 2017-07-30 DIAGNOSIS — Z8673 Personal history of transient ischemic attack (TIA), and cerebral infarction without residual deficits: Secondary | ICD-10-CM

## 2017-07-30 DIAGNOSIS — E43 Unspecified severe protein-calorie malnutrition: Secondary | ICD-10-CM | POA: Diagnosis present

## 2017-07-30 DIAGNOSIS — J449 Chronic obstructive pulmonary disease, unspecified: Secondary | ICD-10-CM | POA: Diagnosis present

## 2017-07-30 DIAGNOSIS — Z85828 Personal history of other malignant neoplasm of skin: Secondary | ICD-10-CM | POA: Diagnosis not present

## 2017-07-30 LAB — CBC
HEMATOCRIT: 29.1 % — AB (ref 36.0–46.0)
HEMOGLOBIN: 9 g/dL — AB (ref 12.0–15.0)
MCH: 29.9 pg (ref 26.0–34.0)
MCHC: 30.9 g/dL (ref 30.0–36.0)
MCV: 96.7 fL (ref 78.0–100.0)
PLATELETS: 154 10*3/uL (ref 150–400)
RBC: 3.01 MIL/uL — AB (ref 3.87–5.11)
RDW: 16.6 % — ABNORMAL HIGH (ref 11.5–15.5)
WBC: 31.3 10*3/uL — AB (ref 4.0–10.5)

## 2017-07-30 LAB — COMPREHENSIVE METABOLIC PANEL
ALT: 68 U/L — AB (ref 14–54)
AST: 107 U/L — AB (ref 15–41)
Albumin: 2.1 g/dL — ABNORMAL LOW (ref 3.5–5.0)
Alkaline Phosphatase: 112 U/L (ref 38–126)
Anion gap: 8 (ref 5–15)
BUN: 20 mg/dL (ref 6–20)
CHLORIDE: 112 mmol/L — AB (ref 101–111)
CO2: 19 mmol/L — ABNORMAL LOW (ref 22–32)
CREATININE: 0.77 mg/dL (ref 0.44–1.00)
Calcium: 7.3 mg/dL — ABNORMAL LOW (ref 8.9–10.3)
GFR calc Af Amer: >60 mL/min (ref >60)
Glucose, Bld: 104 mg/dL — ABNORMAL HIGH (ref 65–99)
Potassium: 3.9 mmol/L (ref 3.5–5.1)
Sodium: 139 mmol/L (ref 135–145)
Total Bilirubin: 0.5 mg/dL (ref 0.3–1.2)
Total Protein: 4.8 g/dL — ABNORMAL LOW (ref 6.5–8.1)

## 2017-07-30 LAB — BLOOD CULTURE ID PANEL (REFLEXED)
ACINETOBACTER BAUMANNII: NOT DETECTED
CANDIDA GLABRATA: NOT DETECTED
CANDIDA KRUSEI: NOT DETECTED
Candida albicans: NOT DETECTED
Candida parapsilosis: NOT DETECTED
Candida tropicalis: NOT DETECTED
Carbapenem resistance: NOT DETECTED
ENTEROCOCCUS SPECIES: NOT DETECTED
ESCHERICHIA COLI: NOT DETECTED
Enterobacter cloacae complex: NOT DETECTED
Enterobacteriaceae species: DETECTED — AB
Haemophilus influenzae: NOT DETECTED
KLEBSIELLA OXYTOCA: NOT DETECTED
Klebsiella pneumoniae: NOT DETECTED
LISTERIA MONOCYTOGENES: NOT DETECTED
Neisseria meningitidis: NOT DETECTED
PSEUDOMONAS AERUGINOSA: NOT DETECTED
Proteus species: NOT DETECTED
SERRATIA MARCESCENS: NOT DETECTED
STAPHYLOCOCCUS SPECIES: NOT DETECTED
STREPTOCOCCUS PNEUMONIAE: NOT DETECTED
STREPTOCOCCUS PYOGENES: NOT DETECTED
Staphylococcus aureus (BCID): NOT DETECTED
Streptococcus agalactiae: NOT DETECTED
Streptococcus species: NOT DETECTED

## 2017-07-30 LAB — PROCALCITONIN: Procalcitonin: 61.22 ng/mL

## 2017-07-30 LAB — STREP PNEUMONIAE URINARY ANTIGEN: STREP PNEUMO URINARY ANTIGEN: NEGATIVE

## 2017-07-30 LAB — MRSA PCR SCREENING: MRSA BY PCR: NEGATIVE

## 2017-07-30 LAB — LACTIC ACID, PLASMA: Lactic Acid, Venous: 1.2 mmol/L (ref 0.5–1.9)

## 2017-07-30 MED ORDER — VANCOMYCIN HCL IN DEXTROSE 1-5 GM/200ML-% IV SOLN: 1000.0000 mg | Freq: Once | INTRAVENOUS | Status: DC

## 2017-07-30 MED ORDER — ACETAMINOPHEN 325 MG PO TABS
650.0000 mg | ORAL_TABLET | Freq: Four times a day (QID) | ORAL | Status: DC | PRN
  Administered 2017-07-30 – 2017-08-02 (×6): 650 mg via ORAL
  Filled 2017-07-30 (×6): qty 2

## 2017-07-30 MED ORDER — DEXTROSE 5 % IV SOLN
2.0000 g | INTRAVENOUS | Status: DC
  Administered 2017-07-30 – 2017-08-02 (×4): 2 g via INTRAVENOUS
  Filled 2017-07-30 (×4): qty 2

## 2017-07-30 MED ORDER — METHYLPREDNISOLONE SODIUM SUCC 125 MG IJ SOLR
INTRAMUSCULAR | Status: AC
  Filled 2017-07-30: qty 2

## 2017-07-30 MED ORDER — SODIUM CHLORIDE 0.9 % IV BOLUS (SEPSIS)
250.0000 mL | INTRAVENOUS | Status: AC
  Administered 2017-07-30: 250 mL via INTRAVENOUS

## 2017-07-30 MED ORDER — ATORVASTATIN CALCIUM 10 MG PO TABS
10.0000 mg | ORAL_TABLET | Freq: Every day | ORAL | Status: DC
  Administered 2017-07-30 – 2017-08-03 (×5): 10 mg via ORAL
  Filled 2017-07-30 (×6): qty 1

## 2017-07-30 MED ORDER — ONDANSETRON HCL 4 MG PO TABS: 4.0000 mg | ORAL_TABLET | Freq: Four times a day (QID) | ORAL | Status: DC | PRN

## 2017-07-30 MED ORDER — ONDANSETRON HCL 4 MG/2ML IJ SOLN: 4.0000 mg | Freq: Four times a day (QID) | INTRAMUSCULAR | Status: DC | PRN

## 2017-07-30 MED ORDER — PIPERACILLIN-TAZOBACTAM 3.375 G IVPB 30 MIN: 3.3750 g | Freq: Once | INTRAVENOUS | Status: DC

## 2017-07-30 MED ORDER — SODIUM CHLORIDE 0.9 % IV SOLN
INTRAVENOUS | Status: AC
  Administered 2017-07-30: 08:00:00 via INTRAVENOUS

## 2017-07-30 MED ORDER — ACETAMINOPHEN 650 MG RE SUPP: 650.0000 mg | Freq: Four times a day (QID) | RECTAL | Status: DC | PRN

## 2017-07-30 MED ORDER — LEVOTHYROXINE SODIUM 25 MCG PO TABS
25.0000 ug | ORAL_TABLET | Freq: Every day | ORAL | Status: DC
  Administered 2017-07-30 – 2017-08-03 (×5): 25 ug via ORAL
  Filled 2017-07-30 (×6): qty 1

## 2017-07-30 MED ORDER — HYDROCORTISONE NA SUCCINATE PF 100 MG IJ SOLR
50.0000 mg | Freq: Four times a day (QID) | INTRAMUSCULAR | Status: DC
  Administered 2017-07-30 – 2017-07-31 (×4): 50 mg via INTRAVENOUS
  Filled 2017-07-30 (×4): qty 2

## 2017-07-30 MED ORDER — VANCOMYCIN HCL IN DEXTROSE 750-5 MG/150ML-% IV SOLN: 750.0000 mg | INTRAVENOUS | Status: DC

## 2017-07-30 MED ORDER — INFLUENZA VAC SPLIT HIGH-DOSE 0.5 ML IM SUSY
0.5000 mL | PREFILLED_SYRINGE | INTRAMUSCULAR | Status: AC
  Administered 2017-07-31: 0.5 mL via INTRAMUSCULAR
  Filled 2017-07-30: qty 0.5

## 2017-07-30 MED ORDER — NYSTATIN 100000 UNIT/GM EX CREA: 1.0000 "application " | TOPICAL_CREAM | Freq: Two times a day (BID) | CUTANEOUS | Status: DC | PRN

## 2017-07-30 MED ORDER — APIXABAN 2.5 MG PO TABS
2.5000 mg | ORAL_TABLET | Freq: Two times a day (BID) | ORAL | Status: DC
  Administered 2017-07-30 – 2017-08-03 (×9): 2.5 mg via ORAL
  Filled 2017-07-30 (×11): qty 1

## 2017-07-30 NOTE — Progress Notes (Signed)
PHARMACY - PHYSICIAN COMMUNICATION CRITICAL VALUE ALERT - BLOOD CULTURE IDENTIFICATION (BCID)  Results for orders placed or performed during the hospital encounter of 07/29/17  Blood Culture ID Panel (Reflexed) (Collected: 07/29/2017  4:53 PM)  Result Value Ref Range   Enterococcus species NOT DETECTED NOT DETECTED   Listeria monocytogenes NOT DETECTED NOT DETECTED   Staphylococcus species NOT DETECTED NOT DETECTED   Staphylococcus aureus NOT DETECTED NOT DETECTED   Streptococcus species NOT DETECTED NOT DETECTED   Streptococcus agalactiae NOT DETECTED NOT DETECTED   Streptococcus pneumoniae NOT DETECTED NOT DETECTED   Streptococcus pyogenes NOT DETECTED NOT DETECTED   Acinetobacter baumannii NOT DETECTED NOT DETECTED   Enterobacteriaceae species DETECTED (A) NOT DETECTED   Enterobacter cloacae complex NOT DETECTED NOT DETECTED   Escherichia coli NOT DETECTED NOT DETECTED   Klebsiella oxytoca NOT DETECTED NOT DETECTED   Klebsiella pneumoniae NOT DETECTED NOT DETECTED   Proteus species NOT DETECTED NOT DETECTED   Serratia marcescens NOT DETECTED NOT DETECTED   Carbapenem resistance NOT DETECTED NOT DETECTED   Haemophilus influenzae NOT DETECTED NOT DETECTED   Neisseria meningitidis NOT DETECTED NOT DETECTED   Pseudomonas aeruginosa NOT DETECTED NOT DETECTED   Candida albicans NOT DETECTED NOT DETECTED   Candida glabrata NOT DETECTED NOT DETECTED   Candida krusei NOT DETECTED NOT DETECTED   Candida parapsilosis NOT DETECTED NOT DETECTED   Candida tropicalis NOT DETECTED NOT DETECTED    Name of physician (or Provider) Contacted: Gher[place]he  Changes to prescribed antibiotics required: Pt already on cefepime which should cover, await sensitivities for further de-escalation. No changes needed at this time.   Brownie Gockel, Rande Lawman 07/30/2017  10:36 AM

## 2017-07-30 NOTE — ED Notes (Signed)
Breakfast tray at bedside 

## 2017-07-30 NOTE — Progress Notes (Signed)
PROGRESS NOTE  [name] [name] XBJ:[id number] DOB: 1926/04/28 DOA: 07/29/2017 PCP: Owens Loffler, MD   LOS: 0 days   Brief Narrative / Interim history: 83 y.o. female with medical history significant of HTN, sCHF Last EF 40-50%, severe AS,  Afib on Eliquis, AAA, CVA, COPD; who presents with complaint of chills. The patient and family  members help provide history. At baseline patient is active and lives at home with 24-hour care. Patient reportedly had gotten up to use the restroom a few more times than usual overnight. This morning and noticed that her urine was dark colored. Patient had reportedly not been acting likeher normal self since early this morning.  She was admitted to stepdown with severe sepsis due to urinary tract infection  Assessment & Plan: Active Problems:   Essential hypertension   Protein-calorie malnutrition, severe (HCC)   Severe sepsis (HCC)   UTI (urinary tract infection)   H/O: CVA (cerebrovascular accident)   Chronic systolic CHF (congestive heart failure) (HCC)   Severe sepsis due to UTI -Blood cultures reflex panel positive for enterobacteriaceae species, continue broad-spectrum antibiotics with cefepime -unlikely to have pneumonia, no respiratory symptoms, no cough -Admit to stepdown -Continue IV fluids, blood pressure is in the 70s however she is asymptomatic.  Received steroids last night, continue stress dose  Acute encephalopathy -Likely in the setting of fever/sepsis -Improved  Hypotension -Hold metoprolol fluids, steroids  Atrial fibrillation - patient's CHA2DS2-VASc Score for Stroke Risk is > 2, continue Eliquis  Systolic CHF with severe As -Continue fluids as tolerated  Hypothyroidism -Continue Synthroid  Anemia of chronic disease -Hemoglobin stable,   DVT prophylaxis: Eliquis Code Status: DNR Family Communication: daughters bedside Disposition Plan: remain in SCU  Consultants:   None   Procedures:   None    Antimicrobials:  Cefepime 10/13 >>   Subjective: - no chest pain, shortness of breath, no abdominal pain, nausea or vomiting.   Objective: Vitals:   07/30/17 0830 07/30/17 0845 07/30/17 0900 07/30/17 0930  BP: (!) 89/63  107/64 (!) 79/53  Pulse: 89 97 93 (!) 103  Resp: (!) 21 18  (!) 25  Temp:      TempSrc:      SpO2: 99%  97% 99%  Weight:      Height:        Intake/Output Summary (Last 24 hours) at 07/30/17 1110 Last data filed at 07/30/17 3086  Gross per 24 hour  Intake             2900 ml  Output              225 ml  Net             2675 ml   Filed Weights   07/29/17 1352  Weight: 45.4 kg (100 lb)    Examination:  Constitutional: NAD Eyes: lids and conjunctivae normal ENMT: Mucous membranes are dry Neck: normal, supple, no masses, no thyromegaly Respiratory: clear to auscultation bilaterally, no wheezing, no crackles. Cardiovascular: irregular, 3/6 SEM Abdomen: no tenderness. Bowel sounds positive.  Musculoskeletal: no clubbing / cyanosis. No joint deformity upper and lower extremities. No contractures. Normal muscle tone.  Skin: no rashes, lesions, ulcers. No induration Neurologic: non focal   Data Reviewed: I have independently reviewed following labs and imaging studies   CBC:  Recent Labs Lab 07/29/17 1522 07/30/17 0329  WBC 19.4* 31.3*  HGB 10.4* 9.0*  HCT 34.0* 29.1*  MCV 97.4 96.7  PLT 154 578   Basic Metabolic  Panel:  Recent Labs Lab 07/29/17 1522 07/30/17 0329  NA 141 139  K 3.8 3.9  CL 113* 112*  CO2 21* 19*  GLUCOSE 82 104*  BUN 19 20  CREATININE 0.83 0.77  CALCIUM 7.7* 7.3*   GFR: Estimated Creatinine Clearance: 34.2 mL/min (by C-G formula based on SCr of 0.77 mg/dL). Liver Function Tests:  Recent Labs Lab 07/29/17 1522 07/30/17 0329  AST 226* 107*  ALT 99* 68*  ALKPHOS 140* 112  BILITOT 0.8 0.5  PROT 5.1* 4.8*  ALBUMIN 2.4* 2.1*   No results for input(s): LIPASE, AMYLASE in the last 168 hours. No results for  input(s): AMMONIA in the last 168 hours. Coagulation Profile: No results for input(s): INR, PROTIME in the last 168 hours. Cardiac Enzymes: No results for input(s): CKTOTAL, CKMB, CKMBINDEX, TROPONINI in the last 168 hours. BNP (last 3 results) No results for input(s): PROBNP in the last 8760 hours. HbA1C: No results for input(s): HGBA1C in the last 72 hours. CBG: No results for input(s): GLUCAP in the last 168 hours. Lipid Profile: No results for input(s): CHOL, HDL, LDLCALC, TRIG, CHOLHDL, LDLDIRECT in the last 72 hours. Thyroid Function Tests: No results for input(s): TSH, T4TOTAL, FREET4, T3FREE, THYROIDAB in the last 72 hours. Anemia Panel: No results for input(s): VITAMINB12, FOLATE, FERRITIN, TIBC, IRON, RETICCTPCT in the last 72 hours. Urine analysis:    Component Value Date/Time   COLORURINE AMBER (A) 07/29/2017 1935   APPEARANCEUR HAZY (A) 07/29/2017 1935   LABSPEC 1.016 07/29/2017 1935   PHURINE 5.0 07/29/2017 1935   GLUCOSEU NEGATIVE 07/29/2017 1935   GLUCOSEU NEGATIVE 05/08/2017 1147   HGBUR LARGE (A) 07/29/2017 1935   BILIRUBINUR NEGATIVE 07/29/2017 1935   BILIRUBINUR negative 11/22/2016 [place] 07/29/2017 1935   PROTEINUR 30 (A) 07/29/2017 1935   UROBILINOGEN 0.2 05/08/2017 1147   NITRITE NEGATIVE 07/29/2017 1935   LEUKOCYTESUR NEGATIVE 07/29/2017 1935   Sepsis Labs: Invalid input(s): PROCALCITONIN, LACTICIDVEN  Recent Results (from the past 240 hour(s))  Blood culture (routine x 2)     Status: None (Preliminary result)   Collection Time: 07/29/17  4:53 PM  Result Value Ref Range Status   Specimen Description BLOOD RIGHT FOREARM  Final   Special Requests   Final    BOTTLES DRAWN AEROBIC ONLY Blood Culture adequate volume   Culture  Setup Time   Final    GRAM NEGATIVE RODS AEROBIC BOTTLE ONLY CRITICAL RESULT CALLED TO, READ BACK BY AND VERIFIED WITH: R RUMBARGER,PHARMD AT 1027 07/30/17 BY L BENFIELD    Culture GRAM NEGATIVE RODS  Final    Report Status PENDING  Incomplete  Blood Culture ID Panel (Reflexed)     Status: Abnormal   Collection Time: 07/29/17  4:53 PM  Result Value Ref Range Status   Enterococcus species NOT DETECTED NOT DETECTED Final   Listeria monocytogenes NOT DETECTED NOT DETECTED Final   Staphylococcus species NOT DETECTED NOT DETECTED Final   Staphylococcus aureus NOT DETECTED NOT DETECTED Final   Streptococcus species NOT DETECTED NOT DETECTED Final   Streptococcus agalactiae NOT DETECTED NOT DETECTED Final   Streptococcus pneumoniae NOT DETECTED NOT DETECTED Final   Streptococcus pyogenes NOT DETECTED NOT DETECTED Final   Acinetobacter baumannii NOT DETECTED NOT DETECTED Final   Enterobacteriaceae species DETECTED (A) NOT DETECTED Final    Comment: Enterobacteriaceae represent a large family of gram negative bacteria, not a single organism. Refer to culture for further identification. CRITICAL RESULT CALLED TO, READ BACK BY AND VERIFIED  WITH: R RUMBARGER,PHARMD AT 2122 07/30/17 BY L BENFIELD    Enterobacter cloacae complex NOT DETECTED NOT DETECTED Final   Escherichia coli NOT DETECTED NOT DETECTED Final   Klebsiella oxytoca NOT DETECTED NOT DETECTED Final   Klebsiella pneumoniae NOT DETECTED NOT DETECTED Final   Proteus species NOT DETECTED NOT DETECTED Final   Serratia marcescens NOT DETECTED NOT DETECTED Final   Carbapenem resistance NOT DETECTED NOT DETECTED Final   Haemophilus influenzae NOT DETECTED NOT DETECTED Final   Neisseria meningitidis NOT DETECTED NOT DETECTED Final   Pseudomonas aeruginosa NOT DETECTED NOT DETECTED Final   Candida albicans NOT DETECTED NOT DETECTED Final   Candida glabrata NOT DETECTED NOT DETECTED Final   Candida krusei NOT DETECTED NOT DETECTED Final   Candida parapsilosis NOT DETECTED NOT DETECTED Final   Candida tropicalis NOT DETECTED NOT DETECTED Final      Radiology Studies: Dg Chest Port 1 View  Result Date: 07/29/2017 CLINICAL DATA:  83 y/o  F;  sepsis. EXAM: PORTABLE CHEST 1 VIEW COMPARISON:  04/22/2017 chest radiograph FINDINGS: Stable mildly enlarged cardiac silhouette. Aortic atherosclerosis with calcification. Elevated left hemidiaphragm and left basilar opacity probably representing associated atelectasis. Pneumonia is possible. No pleural effusion or pneumothorax. Mild dextrocurvature of the thoracic spine. IMPRESSION: Left basilar opacity may represent pneumonia or atelectasis associated with elevated left hemidiaphragm. Electronically Signed   By: Kristine Garbe M.D.   On: 07/29/2017 18:56     Scheduled Meds: . apixaban  2.5 mg Oral BID  . atorvastatin  10 mg Oral Daily  . hydrocortisone sod succinate (SOLU-CORTEF) inj  50 mg Intravenous Q6H  . levothyroxine  25 mcg Oral QAC breakfast   Continuous Infusions: . sodium chloride 125 mL/hr at 07/30/17 0731  . ceFEPime (MAXIPIME) IV Stopped (07/30/17 0130)    Marzetta Board, MD, PhD [facility] Pager [phone]  If 7PM-7AM, please contact night-coverage [email or web address] Password [email or web address] 07/30/2017, 11:10 AM

## 2017-07-30 NOTE — ED Notes (Signed)
Breakfast tray ordered 

## 2017-07-30 NOTE — ED Notes (Signed)
Bladder scanned pt 3 times and stated little or no urine in bladder. Pt. States that she doesn't have the urge to void at all.

## 2017-07-30 NOTE — Progress Notes (Signed)
Pharmacy Antibiotic Note  [name] [name] is a 83 y.o. female admitted on 07/29/2017 with HCAP/sepsis.  Pharmacy has been consulted for Vancomycin/Cefepime dosing. WBC elevated. Renal function age appropriate. Noted low body weight.   Plan: Vancomycin 750 mg IV q24h Cefepime 2g IV q24h Trend WBC, temp, renal function  F/U infectious work-up Drug levels as indicated   Height: 5' (152.4 cm) Weight: 100 lb (45.4 kg) IBW/kg (Calculated) : 45.5  Temp (24hrs), Avg:99.7 F (37.6 C), Min:99.1 F (37.3 C), Max:100.7 F (38.2 C)   Recent Labs Lab 07/29/17 1522 07/29/17 1536 07/29/17 2045  WBC 19.4*  --   --   CREATININE 0.83  --   --   LATICACIDVEN  --  2.13* 2.65*    Estimated Creatinine Clearance: 32.9 mL/min (by C-G formula based on SCr of 0.83 mg/dL).    No Known Allergies   Narda Bonds 07/30/2017 12:21 AM

## 2017-07-31 LAB — CBC
HCT: 29.3 % — ABNORMAL LOW (ref 36.0–46.0)
HEMOGLOBIN: 9.4 g/dL — AB (ref 12.0–15.0)
MCH: 30.9 pg (ref 26.0–34.0)
MCHC: 32.1 g/dL (ref 30.0–36.0)
MCV: 96.4 fL (ref 78.0–100.0)
PLATELETS: 156 10*3/uL (ref 150–400)
RBC: 3.04 MIL/uL — AB (ref 3.87–5.11)
RDW: 17 % — ABNORMAL HIGH (ref 11.5–15.5)
WBC: 30.6 10*3/uL — AB (ref 4.0–10.5)

## 2017-07-31 LAB — BASIC METABOLIC PANEL
ANION GAP: 7 (ref 5–15)
BUN: 22 mg/dL — ABNORMAL HIGH (ref 6–20)
CALCIUM: 7.6 mg/dL — AB (ref 8.9–10.3)
CO2: 18 mmol/L — AB (ref 22–32)
CREATININE: 0.77 mg/dL (ref 0.44–1.00)
Chloride: 115 mmol/L — ABNORMAL HIGH (ref 101–111)
Glucose, Bld: 130 mg/dL — ABNORMAL HIGH (ref 65–99)
Potassium: 3.7 mmol/L (ref 3.5–5.1)
SODIUM: 140 mmol/L (ref 135–145)

## 2017-07-31 MED ORDER — HYDROCORTISONE NA SUCCINATE PF 100 MG IJ SOLR
50.0000 mg | Freq: Two times a day (BID) | INTRAMUSCULAR | Status: DC
  Administered 2017-07-31 – 2017-08-01 (×2): 50 mg via INTRAVENOUS
  Filled 2017-07-31 (×2): qty 2

## 2017-07-31 MED ORDER — METOPROLOL TARTRATE 12.5 MG HALF TABLET
12.5000 mg | ORAL_TABLET | Freq: Two times a day (BID) | ORAL | Status: DC
  Administered 2017-07-31 – 2017-08-01 (×4): 12.5 mg via ORAL
  Filled 2017-07-31 (×4): qty 1

## 2017-07-31 MED ORDER — METOPROLOL TARTRATE 5 MG/5ML IV SOLN: 2.5000 mg | Freq: Four times a day (QID) | INTRAVENOUS | Status: DC | PRN

## 2017-07-31 NOTE — Discharge Instructions (Addendum)
Record BP and Heart rate daily and take to your PCP at next office visit Please take all your medications with you for your next visit with your Primary MD. Please request your Primary MD to go over all hospital test results at the follow up. Please ask your Primary MD to get all Hospital records sent to his/her office.  If you experience worsening of your admission symptoms, develop shortness of breath, chest pain, suicidal or homicidal thoughts or a life threatening emergency, you must seek medical attention immediately by calling 911 or calling your MD.  [name] [name] must read the complete instructions/literature along with all the possible adverse reactions/side effects for all the medicines you take including new medications that have been prescribed to you. Take new medicines after you have completely understood and accpet all the possible adverse reactions/side effects.   Do not drive when taking pain medications or sedatives.    Do not take more than prescribed Pain, Sleep and Anxiety Medications  If you have smoked or chewed Tobacco in the last 2 yrs please stop. Stop any regular alcohol and or recreational drug use.  Wear Seat belts while driving.  Information on my medicine - ELIQUIS (apixaban)  Why was Eliquis prescribed for you? Eliquis was prescribed for you to reduce the risk of a blood clot forming that can cause a stroke if you have a medical condition called atrial fibrillation (a type of irregular heartbeat).  What do You need to know about Eliquis ? Take your Eliquis TWICE DAILY - one tablet in the morning and one tablet in the evening with or without food. If you have difficulty swallowing the tablet whole please discuss with your pharmacist how to take the medication safely.  Take Eliquis exactly as prescribed by your doctor and DO NOT stop taking Eliquis without talking to the doctor who prescribed the medication.  Stopping may increase your risk of developing a  stroke.  Refill your prescription before you run out.  After discharge, you should have regular check-up appointments with your healthcare provider that is prescribing your Eliquis.  In the future your dose may need to be changed if your kidney function or weight changes by a significant amount or as you get older.  What do you do if you miss a dose? If you miss a dose, take it as soon as you remember on the same day and resume taking twice daily.  Do not take more than one dose of ELIQUIS at the same time to make up a missed dose.  Important Safety Information A possible side effect of Eliquis is bleeding. You should call your healthcare provider right away if you experience any of the following: ? Bleeding from an injury or your nose that does not stop. ? Unusual colored urine (red or dark brown) or unusual colored stools (red or black). ? Unusual bruising for unknown reasons. ? A serious fall or if you hit your head (even if there is no bleeding).  Some medicines may interact with Eliquis and might increase your risk of bleeding or clotting while on Eliquis. To help avoid this, consult your healthcare provider or pharmacist prior to using any new prescription or non-prescription medications, including herbals, vitamins, non-steroidal anti-inflammatory drugs (NSAIDs) and supplements.  This website has more information on Eliquis (apixaban): http://www.eliquis.com/eliquis/home

## 2017-07-31 NOTE — Progress Notes (Addendum)
PROGRESS NOTE  [name] [name] QIO:[id number] DOB: 24-Jun-1926 DOA: 07/29/2017 PCP: Owens Loffler, MD   LOS: 1 day   Brief Narrative / Interim history: 83 y.o. female with medical history significant of HTN, sCHF Last EF 40-50%, severe AS,  Afib on Eliquis, AAA, CVA, COPD; who presents with complaint of chills. The patient and family  members help provide history. At baseline patient is active and lives at home with 24-hour care. Patient reportedly had gotten up to use the restroom a few more times than usual overnight. This morning and noticed that her urine was dark colored. Patient had reportedly not been acting likeher normal self since early this morning.  She was admitted to stepdown with severe sepsis due to urinary tract infection. She was found to be bacteremic  Assessment & Plan: Principal Problem:   Severe sepsis ([place]) Active Problems:   Essential hypertension   Protein-calorie malnutrition, severe (HCC)   UTI (urinary tract infection)   H/O: CVA (cerebrovascular accident)   Chronic systolic CHF (congestive heart failure) (HCC)   Pressure injury of skin   Severe sepsis due to UTI -Blood cultures reflex panel positive for enterobacteriaceae species, continue broad-spectrum antibiotics with cefepime, speciation / sensitivities pending -initially believed to have pneumonia based on CXR, but she has no symptoms, this is unlikely -remain in stepdown today  -received IVF yesterday, now on hold, BP better. Would like to avoid fluid overload -responded well to stress dose steroids, decrease steroids today to Q12 -she has been having recurrent sepsis, family interested in suppressive antibiotics at home, will determine selection/dose based on antibiogram and when closer to discharge home  Acute encephalopathy -Likely in the setting of fever/sepsis -resolved, now back to baseline  Hypotension -Hold metoprolol, BP improved, stay off IVF, allow po intake -continue  steroids  Atrial fibrillation -patient's CHA2DS2-VASc Score for Stroke Risk is > 2, continue Eliquis -rate controlled, resume metoprolol likely later today or tomorrow am if BP is stable   Systolic CHF with severe As -on room air, she seemed to have tolerated fluids well, hold further IVF  Hypothyroidism -Continue Synthroid  Anemia of chronic disease -Hemoglobin stable,   DVT prophylaxis: Eliquis Code Status: DNR Family Communication: daughters bedside Disposition Plan: remain in SCU  Consultants:   None   Procedures:   None   Antimicrobials:  Cefepime 10/13 >>   Subjective: -feels well this morning, no chest pain, no breathing difficulties. No abdominal pain, eating well without nausea  Objective: Vitals:   07/31/17 0406 07/31/17 0500 07/31/17 0600 07/31/17 0800  BP:  108/63 104/69 114/73  Pulse:  83 79 82  Resp:  19 (!) 24 (!) 22  Temp:      TempSrc:      SpO2:  98% 98% 97%  Weight: 48.7 kg (107 lb 5.8 oz)     Height:        Intake/Output Summary (Last 24 hours) at 07/31/17 0901 Last data filed at 07/31/17 0900  Gross per 24 hour  Intake          1349.17 ml  Output             1060 ml  Net           289.17 ml   Filed Weights   07/29/17 1352 07/30/17 1000 07/31/17 0406  Weight: 45.4 kg (100 lb) 47.4 kg (104 lb 9.6 oz) 48.7 kg (107 lb 5.8 oz)    Examination:  Constitutional: NAD Eyes: no scleral icterus  ENMT: MMM  Respiratory: CTA biL, no wheezing, no crackles Cardiovascular: irregular, 3/6 SEM  Abdomen: no tenderness. BS + Skin: no rashes Neurologic: non focal   Data Reviewed: I have independently reviewed following labs and imaging studies   CBC:  Recent Labs Lab 07/29/17 1522 07/30/17 0329 07/31/17 0309  WBC 19.4* 31.3* 30.6*  HGB 10.4* 9.0* 9.4*  HCT 34.0* 29.1* 29.3*  MCV 97.4 96.7 96.4  PLT 154 154 081   Basic Metabolic Panel:  Recent Labs Lab 07/29/17 1522 07/30/17 0329 07/31/17 0309  NA 141 139 140  K 3.8 3.9 3.7   CL 113* 112* 115*  CO2 21* 19* 18*  GLUCOSE 82 104* 130*  BUN 19 20 22*  CREATININE 0.83 0.77 0.77  CALCIUM 7.7* 7.3* 7.6*   GFR: Estimated Creatinine Clearance: 34.2 mL/min (by C-G formula based on SCr of 0.77 mg/dL). Liver Function Tests:  Recent Labs Lab 07/29/17 1522 07/30/17 0329  AST 226* 107*  ALT 99* 68*  ALKPHOS 140* 112  BILITOT 0.8 0.5  PROT 5.1* 4.8*  ALBUMIN 2.4* 2.1*   No results for input(s): LIPASE, AMYLASE in the last 168 hours. No results for input(s): AMMONIA in the last 168 hours. Coagulation Profile: No results for input(s): INR, PROTIME in the last 168 hours. Cardiac Enzymes: No results for input(s): CKTOTAL, CKMB, CKMBINDEX, TROPONINI in the last 168 hours. BNP (last 3 results) No results for input(s): PROBNP in the last 8760 hours. HbA1C: No results for input(s): HGBA1C in the last 72 hours. CBG: No results for input(s): GLUCAP in the last 168 hours. Lipid Profile: No results for input(s): CHOL, HDL, LDLCALC, TRIG, CHOLHDL, LDLDIRECT in the last 72 hours. Thyroid Function Tests: No results for input(s): TSH, T4TOTAL, FREET4, T3FREE, THYROIDAB in the last 72 hours. Anemia Panel: No results for input(s): VITAMINB12, FOLATE, FERRITIN, TIBC, IRON, RETICCTPCT in the last 72 hours. Urine analysis:    Component Value Date/Time   COLORURINE AMBER (A) 07/29/2017 1935   APPEARANCEUR HAZY (A) 07/29/2017 1935   LABSPEC 1.016 07/29/2017 1935   PHURINE 5.0 07/29/2017 1935   GLUCOSEU NEGATIVE 07/29/2017 1935   GLUCOSEU NEGATIVE 05/08/2017 1147   HGBUR LARGE (A) 07/29/2017 1935   BILIRUBINUR NEGATIVE 07/29/2017 1935   BILIRUBINUR negative 11/22/2016 [place] 07/29/2017 1935   PROTEINUR 30 (A) 07/29/2017 1935   UROBILINOGEN 0.2 05/08/2017 1147   NITRITE NEGATIVE 07/29/2017 1935   LEUKOCYTESUR NEGATIVE 07/29/2017 1935   Sepsis Labs: Invalid input(s): PROCALCITONIN, LACTICIDVEN  Recent Results (from the past 240 hour(s))  Blood  culture (routine x 2)     Status: None (Preliminary result)   Collection Time: 07/29/17  4:53 PM  Result Value Ref Range Status   Specimen Description BLOOD RIGHT FOREARM  Final   Special Requests   Final    BOTTLES DRAWN AEROBIC ONLY Blood Culture adequate volume   Culture  Setup Time   Final    GRAM NEGATIVE RODS AEROBIC BOTTLE ONLY CRITICAL RESULT CALLED TO, READ BACK BY AND VERIFIED WITH: R RUMBARGER,PHARMD AT 1027 07/30/17 BY L BENFIELD    Culture GRAM NEGATIVE RODS  Final   Report Status PENDING  Incomplete  Blood Culture ID Panel (Reflexed)     Status: Abnormal   Collection Time: 07/29/17  4:53 PM  Result Value Ref Range Status   Enterococcus species NOT DETECTED NOT DETECTED Final   Listeria monocytogenes NOT DETECTED NOT DETECTED Final   Staphylococcus species NOT DETECTED NOT DETECTED Final   Staphylococcus aureus NOT DETECTED NOT  DETECTED Final   Streptococcus species NOT DETECTED NOT DETECTED Final   Streptococcus agalactiae NOT DETECTED NOT DETECTED Final   Streptococcus pneumoniae NOT DETECTED NOT DETECTED Final   Streptococcus pyogenes NOT DETECTED NOT DETECTED Final   Acinetobacter baumannii NOT DETECTED NOT DETECTED Final   Enterobacteriaceae species DETECTED (A) NOT DETECTED Final    Comment: Enterobacteriaceae represent a large family of gram negative bacteria, not a single organism. Refer to culture for further identification. CRITICAL RESULT CALLED TO, READ BACK BY AND VERIFIED WITH: R RUMBARGER,PHARMD AT 1027 07/30/17 BY L BENFIELD    Enterobacter cloacae complex NOT DETECTED NOT DETECTED Final   Escherichia coli NOT DETECTED NOT DETECTED Final   Klebsiella oxytoca NOT DETECTED NOT DETECTED Final   Klebsiella pneumoniae NOT DETECTED NOT DETECTED Final   Proteus species NOT DETECTED NOT DETECTED Final   Serratia marcescens NOT DETECTED NOT DETECTED Final   Carbapenem resistance NOT DETECTED NOT DETECTED Final   Haemophilus influenzae NOT DETECTED NOT DETECTED  Final   Neisseria meningitidis NOT DETECTED NOT DETECTED Final   Pseudomonas aeruginosa NOT DETECTED NOT DETECTED Final   Candida albicans NOT DETECTED NOT DETECTED Final   Candida glabrata NOT DETECTED NOT DETECTED Final   Candida krusei NOT DETECTED NOT DETECTED Final   Candida parapsilosis NOT DETECTED NOT DETECTED Final   Candida tropicalis NOT DETECTED NOT DETECTED Final  Blood culture (routine x 2)     Status: None (Preliminary result)   Collection Time: 07/29/17  5:03 PM  Result Value Ref Range Status   Specimen Description BLOOD RIGHT ANTECUBITAL  Final   Special Requests   Final    BOTTLES DRAWN AEROBIC ONLY Blood Culture adequate volume   Culture NO GROWTH < 24 HOURS  Final   Report Status PENDING  Incomplete  MRSA PCR Screening     Status: None   Collection Time: 07/30/17 10:20 AM  Result Value Ref Range Status   MRSA by PCR NEGATIVE NEGATIVE Final    Comment:        The GeneXpert MRSA Assay (FDA approved for NASAL specimens only), is one component of a comprehensive MRSA colonization surveillance program. It is not intended to diagnose MRSA infection nor to guide or monitor treatment for MRSA infections.       Radiology Studies: Dg Chest Port 1 View  Result Date: 07/29/2017 CLINICAL DATA:  83 y/o  F; sepsis. EXAM: PORTABLE CHEST 1 VIEW COMPARISON:  04/22/2017 chest radiograph FINDINGS: Stable mildly enlarged cardiac silhouette. Aortic atherosclerosis with calcification. Elevated left hemidiaphragm and left basilar opacity probably representing associated atelectasis. Pneumonia is possible. No pleural effusion or pneumothorax. Mild dextrocurvature of the thoracic spine. IMPRESSION: Left basilar opacity may represent pneumonia or atelectasis associated with elevated left hemidiaphragm. Electronically Signed   By: Kristine Garbe M.D.   On: 07/29/2017 18:56     Scheduled Meds: . apixaban  2.5 mg Oral BID  . atorvastatin  10 mg Oral Daily  .  hydrocortisone sod succinate (SOLU-CORTEF) inj  50 mg Intravenous Q12H  . Influenza vac split quadrivalent PF  0.5 mL Intramuscular Tomorrow-1000  . levothyroxine  25 mcg Oral QAC breakfast   Continuous Infusions: . ceFEPime (MAXIPIME) IV Stopped (07/31/17 0138)    Marzetta Board, MD, PhD [facility] Pager [phone]  If 7PM-7AM, please contact night-coverage [email or web address] Password [email or web address] 07/31/2017, 9:01 AM

## 2017-07-31 NOTE — Progress Notes (Signed)
PT Cancellation Note  Patient Details Name: [name] [name] MRN: [id number] DOB: August 31, 1926   Cancelled Treatment:    Reason Eval/Treat Not Completed: Other (comment) (attempted to see pt however, she just received lunch and declined getting up to chair to eat. Will attempt next date)   Chrishawn Boley B Shreena Baines 07/31/2017, 1:22 PM  Elwyn Reach, [place]

## 2017-08-01 LAB — URINE CULTURE

## 2017-08-01 LAB — COMPREHENSIVE METABOLIC PANEL
ALT: 30 U/L (ref 14–54)
AST: 25 U/L (ref 15–41)
Albumin: 1.9 g/dL — ABNORMAL LOW (ref 3.5–5.0)
Alkaline Phosphatase: 109 U/L (ref 38–126)
Anion gap: 6 (ref 5–15)
BUN: 24 mg/dL — ABNORMAL HIGH (ref 6–20)
CO2: 21 mmol/L — ABNORMAL LOW (ref 22–32)
Calcium: 7.9 mg/dL — ABNORMAL LOW (ref 8.9–10.3)
Chloride: 115 mmol/L — ABNORMAL HIGH (ref 101–111)
Creatinine, Ser: 0.65 mg/dL (ref 0.44–1.00)
GFR calc Af Amer: >60 mL/min (ref >60)
GFR calc non Af Amer: >60 mL/min (ref >60)
Glucose, Bld: 106 mg/dL — ABNORMAL HIGH (ref 65–99)
Potassium: 3.3 mmol/L — ABNORMAL LOW (ref 3.5–5.1)
Sodium: 142 mmol/L (ref 135–145)
Total Bilirubin: 0.4 mg/dL (ref 0.3–1.2)
Total Protein: 4.7 g/dL — ABNORMAL LOW (ref 6.5–8.1)

## 2017-08-01 LAB — CBC
HCT: 29.8 % — ABNORMAL LOW (ref 36.0–46.0)
Hemoglobin: 9.4 g/dL — ABNORMAL LOW (ref 12.0–15.0)
MCH: 30.1 pg (ref 26.0–34.0)
MCHC: 31.5 g/dL (ref 30.0–36.0)
MCV: 95.5 fL (ref 78.0–100.0)
Platelets: 170 10*3/uL (ref 150–400)
RBC: 3.12 MIL/uL — ABNORMAL LOW (ref 3.87–5.11)
RDW: 16.7 % — ABNORMAL HIGH (ref 11.5–15.5)
WBC: 25 10*3/uL — ABNORMAL HIGH (ref 4.0–10.5)

## 2017-08-01 LAB — LEGIONELLA PNEUMOPHILA SEROGP 1 UR AG: L. PNEUMOPHILA SEROGP 1 UR AG: NEGATIVE

## 2017-08-01 LAB — CULTURE, BLOOD (ROUTINE X 2): SPECIAL REQUESTS: ADEQUATE

## 2017-08-01 MED ORDER — POTASSIUM CHLORIDE CRYS ER 20 MEQ PO TBCR
20.0000 meq | EXTENDED_RELEASE_TABLET | Freq: Two times a day (BID) | ORAL | Status: DC
  Administered 2017-08-01: 20 meq via ORAL
  Filled 2017-08-01: qty 1

## 2017-08-01 MED ORDER — HYDROCORTISONE NA SUCCINATE PF 100 MG IJ SOLR
25.0000 mg | Freq: Two times a day (BID) | INTRAMUSCULAR | Status: DC
  Administered 2017-08-01 – 2017-08-02 (×2): 25 mg via INTRAVENOUS
  Filled 2017-08-01 (×2): qty 2

## 2017-08-01 NOTE — Progress Notes (Signed)
Physical Therapy Evaluation Patient Details Name: [name] [name] MRN: [id number] DOB: 04-Jan-1926 Today's Date: 08/01/2017   History of Present Illness  83 y.o. female admitted with sepsis due to UTI. PMHx: HTN, sCHF Last EF 40-50%, severe AS,  Afib on Eliquis, AAA, CVA, aortic stenosis, COPD, CVA, hypothyroidism, and chronic anemia.  Clinical Impression   Pt was full of conversation regarding her family and recent passing of her husband upon PT's arrival to room. At times, pt was emotional and crying but was easily redirected to the session. Pt's family member was present assisting with history questions and guidance throughout the session. After amb into hall pt's A-fib triggered an increased HR to 150 bpm. Upon returning to seat, pt's vitals returned to normal. Overall, pt tolerated treatment well with no pain or cognitive changes, and was left in chair w/ family present.    Follow Up Recommendations Home health PT    Equipment Recommendations  Rolling walker with 5" wheels    Recommendations for Other Services       Precautions / Restrictions Precautions Precautions: Fall Restrictions Weight Bearing Restrictions: No      Mobility  Bed Mobility       Transfers Overall transfer level: Needs assistance Equipment used: Rolling walker (2 wheeled) Transfers: Sit to/from Stand Sit to Stand: Modified independent (Device/Increase time);+2 physical assistance (+2 assist for extra boost out of chair into stand)         General transfer comment: Pt required cueing to use chair armrests to push off instead of using RW to push on for transition from sit to stand  Ambulation/Gait Ambulation/Gait assistance: Modified independent (Device/Increase time);+2 safety/equipment Ambulation Distance (Feet): 25 Feet Assistive device: Rolling walker (2 wheeled) Gait Pattern/deviations: WFL(Within Functional Limits);Decreased stride length   Gait velocity interpretation: <1.8 ft/sec,  indicative of risk for recurrent falls General Gait Details: Pt maintained flexed trunk posture throughout amb. Pt amb ~25 ft, w/ increased fatigue and general weakness. Pt's a-fib triggered heart rate ranging up to 150bpm. After 25 ft, pt sat back into room chair and was rolled back into the room.   Stairs            Wheelchair Mobility    Modified Rankin (Stroke Patients Only)       Balance Overall balance assessment: Modified Independent;Needs assistance                                           Pertinent Vitals/Pain Pain Assessment: No/denies pain    Home Living Family/patient expects to be discharged to:: Private residence Living Arrangements: Alone (Husband recently passed) Available Help at Discharge: Family;Available 24 hours/day Type of Home: House Home Access: Stairs to enter Entrance Stairs-Rails: Left Entrance Stairs-Number of Steps: 4 Home Layout: One level Home Equipment: Walker - 4 wheels;Cane - single point;Bedside commode;Shower seat      Prior Function Level of Independence: Needs assistance      ADL's / Homemaking Assistance Needed: assist for LB dressing and all IADL, pt self feeds, toilets, and bathes herself        Hand Dominance        Extremity/Trunk Assessment   Upper Extremity Assessment Upper Extremity Assessment: Overall WFL for tasks assessed;RUE deficits/detail RUE Deficits / Details:  (R UE flex strength reduced possibly due to medication patch) RUE Coordination: decreased fine motor (pt reported decreased ability to perform fine finger  actions)    Lower Extremity Assessment Lower Extremity Assessment: Overall WFL for tasks assessed    Cervical / Trunk Assessment Cervical / Trunk Assessment: Kyphotic (Extreme flexed trunk position noted)  Communication   Communication: No difficulties  Cognition Arousal/Alertness: Awake/alert Behavior During Therapy: WFL for tasks assessed/performed Overall Cognitive  Status: Within Functional Limits for tasks assessed                                 General Comments: Pt was cognitively stable and provided extensive history of present family      General Comments      Exercises General Exercises - Lower Extremity Ankle Circles/Pumps: AROM;Both;Other reps (comment) (20-30 reps ea) Long Arc Quad: AROM;Both (10-20 reps ea) Hip Flexion/Marching: AROM;Both (5-10 reps ea)   Assessment/Plan    PT Assessment Patient needs continued PT services  PT Problem List Decreased strength;Decreased activity tolerance;Cardiopulmonary status limiting activity       PT Treatment Interventions      PT Goals (Current goals can be found in the Care Plan section)       Frequency     Barriers to discharge        Co-evaluation               AM-PAC PT "6 Clicks" Daily Activity  Outcome Measure Difficulty turning over in bed (including adjusting bedclothes, sheets and blankets)?: A Little Difficulty moving from lying on back to sitting on the side of the bed? : A Lot Difficulty sitting down on and standing up from a chair with arms (e.g., wheelchair, bedside commode, etc,.)?: A Lot Help needed moving to and from a bed to chair (including a wheelchair)?: A Lot Help needed walking in hospital room?: A Lot Help needed climbing 3-5 steps with a railing? : Total 6 Click Score: 12    End of Session Equipment Utilized During Treatment: Gait belt Activity Tolerance: Patient tolerated treatment well;Patient limited by fatigue (Limited by fatigue from A-fib increasing HR when walking) Patient left: in chair;with family/visitor present Nurse Communication: Other (comment) (Notified nurse of A-Fib increasing HR and altering O2 sats) PT Visit Diagnosis: Muscle weakness (generalized) (M62.81);Other (comment) (Decreased tolerance to walking/ambulation)    Time: 1110-1150 PT Time Calculation (min) (ACUTE ONLY): 40 min   Charges:   PT  Evaluation $PT Eval Moderate Complexity: 1 Mod PT Treatments $Therapeutic Exercise: 8-22 mins   PT G CodesDavonna Belling, [place] #[phone]  Davonna Belling 08/01/2017, 2:09 PM

## 2017-08-01 NOTE — Progress Notes (Addendum)
[facility] PROGRESS NOTE  [name] [name] AYT:[id number] DOB: Feb 17, 1926 DOA: 07/29/2017 PCP: Owens Loffler, MD  Brief summary   83 y.o.femalewith medical history significant of HTN, sCHF Last EF 40-50%, severe AS, Afib on Eliquis, AAA, CVA, COPD; who presents with complaint of chills. The patient and family members help provide history. At baseline patient is active and lives at home with 24-hour care. Patient reportedly had gotten up to use the restroom a few more times than usual overnight. This morning and noticed that her urine was dark colored. Patient had reportedly not been acting likeher normal self since early this morning.  She was admitted to stepdown with severe sepsis due to urinary tract infection. She was found to be bacteremic  Assessment & Plan: Principal Problem:   Severe sepsis ([place]) Active Problems:   Essential hypertension   Protein-calorie malnutrition, severe (HCC)   UTI (urinary tract infection)   H/O: CVA (cerebrovascular accident)   Chronic systolic CHF (congestive heart failure) (HCC)   Pressure injury of skin   Severe sepsis due to UTI. Bacteremia. initially believed to have pneumonia based on CXR, but she has no symptoms. Blood cultures reflex panel positive for enterobacteriaceae species -continue broad-spectrum antibiotics with cefepime, speciation / sensitivities pending. responded well to stress dose steroids, start wean 24-48 hrs   Acute encephalopathy. Likely in the setting of fever/sepsis.  -resolved, now back to baseline  Hypotension. Hold metoprolol, BP is soft bu stable.   Atrial fibrillation. patient's CHA2DS2-VASc Score for Stroke Risk is > 2, continue Eliquis. resumed metoprolol   Systolic CHF with severe As. on room air, she seemed to have tolerated fluids well, hold further IVF  Hypothyroidism. Continue Synthroid  Anemia of chronic disease. Hemoglobin stable   DVT prophylaxis: Eliquis Code Status: DNR Family  Communication: daughters bedside Disposition Plan: transfer to TELE  Consultants:   None   Procedures:   None   Antimicrobials:  Cefepime 10/13 >>    HPI/Subjective: Patient reports feeling better. Afebrile. No acute pains.   Objective: Vitals:   08/01/17 1115 08/01/17 1200  BP: 96/73 90/69  Pulse: (!) 106 91  Resp: 15 14  Temp:  97.7 F (36.5 C)  SpO2: 99% 98%    Intake/Output Summary (Last 24 hours) at 08/01/17 1337 Last data filed at 08/01/17 1000  Gross per 24 hour  Intake              490 ml  Output              690 ml  Net             -200 ml   Filed Weights   07/30/17 1000 07/31/17 0406 08/01/17 0500  Weight: 47.4 kg (104 lb 9.6 oz) 48.7 kg (107 lb 5.8 oz) 49.9 kg (110 lb 0.2 oz)    Exam:   General:  No distress   Cardiovascular: s1,s2 rrr  Respiratory: CTA BL  Abdomen: soft, nt, nd   Musculoskeletal: no leg edema    Data Reviewed: Basic Metabolic Panel:  Recent Labs Lab 07/29/17 1522 07/30/17 0329 07/31/17 0309 08/01/17 0447  NA 141 139 140 142  K 3.8 3.9 3.7 3.3*  CL 113* 112* 115* 115*  CO2 21* 19* 18* 21*  GLUCOSE 82 104* 130* 106*  BUN 19 20 22* 24*  CREATININE 0.83 0.77 0.77 0.65  CALCIUM 7.7* 7.3* 7.6* 7.9*   Liver Function Tests:  Recent Labs Lab 07/29/17 1522 07/30/17 0329 08/01/17 0447  AST 226* 107*  25  ALT 99* 68* 30  ALKPHOS 140* 112 109  BILITOT 0.8 0.5 0.4  PROT 5.1* 4.8* 4.7*  ALBUMIN 2.4* 2.1* 1.9*   No results for input(s): LIPASE, AMYLASE in the last 168 hours. No results for input(s): AMMONIA in the last 168 hours. CBC:  Recent Labs Lab 07/29/17 1522 07/30/17 0329 07/31/17 0309 08/01/17 0447  WBC 19.4* 31.3* 30.6* 25.0*  HGB 10.4* 9.0* 9.4* 9.4*  HCT 34.0* 29.1* 29.3* 29.8*  MCV 97.4 96.7 96.4 95.5  PLT 154 154 156 170   Cardiac Enzymes: No results for input(s): CKTOTAL, CKMB, CKMBINDEX, TROPONINI in the last 168 hours. BNP (last 3 results)  Recent Labs  07/29/17 1522  BNP  1,156.6*    ProBNP (last 3 results) No results for input(s): PROBNP in the last 8760 hours.  CBG: No results for input(s): GLUCAP in the last 168 hours.  Recent Results (from the past 240 hour(s))  Blood culture (routine x 2)     Status: Abnormal   Collection Time: 07/29/17  4:53 PM  Result Value Ref Range Status   Specimen Description BLOOD RIGHT FOREARM  Final   Special Requests   Final    BOTTLES DRAWN AEROBIC ONLY Blood Culture adequate volume   Culture  Setup Time   Final    GRAM NEGATIVE RODS AEROBIC BOTTLE ONLY CRITICAL RESULT CALLED TO, READ BACK BY AND VERIFIED WITH: R RUMBARGER,PHARMD AT 1027 07/30/17 BY L BENFIELD    Culture ENTEROBACTER AEROGENES (A)  Final   Report Status 08/01/2017 FINAL  Final   Organism ID, Bacteria ENTEROBACTER AEROGENES  Final      Susceptibility   Enterobacter aerogenes - MIC*    CEFAZOLIN RESISTANT Resistant     CEFEPIME <=1 SENSITIVE Sensitive     CEFTAZIDIME <=1 SENSITIVE Sensitive     CEFTRIAXONE <=1 SENSITIVE Sensitive     CIPROFLOXACIN <=0.25 SENSITIVE Sensitive     GENTAMICIN <=1 SENSITIVE Sensitive     IMIPENEM 2 SENSITIVE Sensitive     TRIMETH/SULFA <=20 SENSITIVE Sensitive     PIP/TAZO 8 SENSITIVE Sensitive     * ENTEROBACTER AEROGENES  Blood Culture ID Panel (Reflexed)     Status: Abnormal   Collection Time: 07/29/17  4:53 PM  Result Value Ref Range Status   Enterococcus species NOT DETECTED NOT DETECTED Final   Listeria monocytogenes NOT DETECTED NOT DETECTED Final   Staphylococcus species NOT DETECTED NOT DETECTED Final   Staphylococcus aureus NOT DETECTED NOT DETECTED Final   Streptococcus species NOT DETECTED NOT DETECTED Final   Streptococcus agalactiae NOT DETECTED NOT DETECTED Final   Streptococcus pneumoniae NOT DETECTED NOT DETECTED Final   Streptococcus pyogenes NOT DETECTED NOT DETECTED Final   Acinetobacter baumannii NOT DETECTED NOT DETECTED Final   Enterobacteriaceae species DETECTED (A) NOT DETECTED Final     Comment: Enterobacteriaceae represent a large family of gram negative bacteria, not a single organism. Refer to culture for further identification. CRITICAL RESULT CALLED TO, READ BACK BY AND VERIFIED WITH: R RUMBARGER,PHARMD AT 1027 07/30/17 BY L BENFIELD    Enterobacter cloacae complex NOT DETECTED NOT DETECTED Final   Escherichia coli NOT DETECTED NOT DETECTED Final   Klebsiella oxytoca NOT DETECTED NOT DETECTED Final   Klebsiella pneumoniae NOT DETECTED NOT DETECTED Final   Proteus species NOT DETECTED NOT DETECTED Final   Serratia marcescens NOT DETECTED NOT DETECTED Final   Carbapenem resistance NOT DETECTED NOT DETECTED Final   Haemophilus influenzae NOT DETECTED NOT DETECTED Final   Neisseria meningitidis  NOT DETECTED NOT DETECTED Final   Pseudomonas aeruginosa NOT DETECTED NOT DETECTED Final   Candida albicans NOT DETECTED NOT DETECTED Final   Candida glabrata NOT DETECTED NOT DETECTED Final   Candida krusei NOT DETECTED NOT DETECTED Final   Candida parapsilosis NOT DETECTED NOT DETECTED Final   Candida tropicalis NOT DETECTED NOT DETECTED Final  Blood culture (routine x 2)     Status: None (Preliminary result)   Collection Time: 07/29/17  5:03 PM  Result Value Ref Range Status   Specimen Description BLOOD RIGHT ANTECUBITAL  Final   Special Requests   Final    BOTTLES DRAWN AEROBIC ONLY Blood Culture adequate volume   Culture  Setup Time   Final    GRAM NEGATIVE RODS AEROBIC BOTTLE ONLY CRITICAL VALUE NOTED.  VALUE IS CONSISTENT WITH PREVIOUSLY REPORTED AND CALLED VALUE.    Culture GRAM NEGATIVE RODS IDENTIFICATION TO FOLLOW   Final   Report Status PENDING  Incomplete  Urine culture     Status: Abnormal   Collection Time: 07/29/17  7:35 PM  Result Value Ref Range Status   Specimen Description Urine  Final   Special Requests NONE  Final   Culture >=100,000 COLONIES/mL ENTEROBACTER AEROGENES (A)  Final   Report Status 08/01/2017 FINAL  Final   Organism ID, Bacteria  ENTEROBACTER AEROGENES (A)  Final      Susceptibility   Enterobacter aerogenes - MIC*    CEFAZOLIN RESISTANT Resistant     CEFEPIME <=1 SENSITIVE Sensitive     CEFTAZIDIME <=1 SENSITIVE Sensitive     CEFTRIAXONE <=1 SENSITIVE Sensitive     CIPROFLOXACIN <=0.25 SENSITIVE Sensitive     GENTAMICIN <=1 SENSITIVE Sensitive     IMIPENEM 1 SENSITIVE Sensitive     TRIMETH/SULFA <=20 SENSITIVE Sensitive     PIP/TAZO <=4 SENSITIVE Sensitive     * >=100,000 COLONIES/mL ENTEROBACTER AEROGENES  MRSA PCR Screening     Status: None   Collection Time: 07/30/17 10:20 AM  Result Value Ref Range Status   MRSA by PCR NEGATIVE NEGATIVE Final    Comment:        The GeneXpert MRSA Assay (FDA approved for NASAL specimens only), is one component of a comprehensive MRSA colonization surveillance program. It is not intended to diagnose MRSA infection nor to guide or monitor treatment for MRSA infections.      Studies: No results found.  Scheduled Meds: . apixaban  2.5 mg Oral BID  . atorvastatin  10 mg Oral Daily  . hydrocortisone sod succinate (SOLU-CORTEF) inj  50 mg Intravenous Q12H  . levothyroxine  25 mcg Oral QAC breakfast  . metoprolol tartrate  12.5 mg Oral BID   Continuous Infusions: . ceFEPime (MAXIPIME) IV Stopped (08/01/17 0106)    Principal Problem:   Severe sepsis ([place]) Active Problems:   Essential hypertension   Protein-calorie malnutrition, severe (HCC)   UTI (urinary tract infection)   H/O: CVA (cerebrovascular accident)   Chronic systolic CHF (congestive heart failure) (HCC)   Pressure injury of skin    Time spent: >35 minutes     Kinnie Feil  [facility] Pager [phone]. If 7PM-7AM, please contact night-coverage at [email or web address], password [facility] 08/01/2017, 1:37 PM  LOS: 2 days     Addendum:  encephalopathy likely septic. Resolving.  Stage II sacral ulcer. shallow open ulcer with a red, pink wound bed without slough. Cont wound care.  Kinnie Feil

## 2017-08-02 DIAGNOSIS — A419 Sepsis, unspecified organism: Principal | ICD-10-CM

## 2017-08-02 DIAGNOSIS — I482 Chronic atrial fibrillation: Secondary | ICD-10-CM

## 2017-08-02 DIAGNOSIS — N390 Urinary tract infection, site not specified: Secondary | ICD-10-CM

## 2017-08-02 DIAGNOSIS — E43 Unspecified severe protein-calorie malnutrition: Secondary | ICD-10-CM

## 2017-08-02 DIAGNOSIS — R6520 Severe sepsis without septic shock: Secondary | ICD-10-CM

## 2017-08-02 DIAGNOSIS — I10 Essential (primary) hypertension: Secondary | ICD-10-CM

## 2017-08-02 DIAGNOSIS — I5022 Chronic systolic (congestive) heart failure: Secondary | ICD-10-CM

## 2017-08-02 LAB — CULTURE, BLOOD (ROUTINE X 2): Special Requests: ADEQUATE

## 2017-08-02 LAB — BASIC METABOLIC PANEL
Anion gap: 6 (ref 5–15)
BUN: 23 mg/dL — AB (ref 6–20)
CHLORIDE: 114 mmol/L — AB (ref 101–111)
CO2: 22 mmol/L (ref 22–32)
Calcium: 7.9 mg/dL — ABNORMAL LOW (ref 8.9–10.3)
Creatinine, Ser: 0.66 mg/dL (ref 0.44–1.00)
GFR calc Af Amer: >60 mL/min (ref >60)
GLUCOSE: 107 mg/dL — AB (ref 65–99)
POTASSIUM: 3.3 mmol/L — AB (ref 3.5–5.1)
SODIUM: 142 mmol/L (ref 135–145)

## 2017-08-02 LAB — CBC
HEMATOCRIT: 29.7 % — AB (ref 36.0–46.0)
HEMOGLOBIN: 9.4 g/dL — AB (ref 12.0–15.0)
MCH: 30.1 pg (ref 26.0–34.0)
MCHC: 31.6 g/dL (ref 30.0–36.0)
MCV: 95.2 fL (ref 78.0–100.0)
Platelets: 177 10*3/uL (ref 150–400)
RBC: 3.12 MIL/uL — AB (ref 3.87–5.11)
RDW: 16.9 % — ABNORMAL HIGH (ref 11.5–15.5)
WBC: 18.6 10*3/uL — ABNORMAL HIGH (ref 4.0–10.5)

## 2017-08-02 LAB — MAGNESIUM: Magnesium: 1.6 mg/dL — ABNORMAL LOW (ref 1.7–2.4)

## 2017-08-02 MED ORDER — METOPROLOL TARTRATE 25 MG PO TABS
25.0000 mg | ORAL_TABLET | Freq: Two times a day (BID) | ORAL | Status: DC
  Administered 2017-08-02 – 2017-08-03 (×3): 25 mg via ORAL
  Filled 2017-08-02 (×3): qty 1

## 2017-08-02 MED ORDER — MAGNESIUM OXIDE 400 (241.3 MG) MG PO TABS
400.0000 mg | ORAL_TABLET | Freq: Two times a day (BID) | ORAL | Status: DC
  Administered 2017-08-02 – 2017-08-03 (×3): 400 mg via ORAL
  Filled 2017-08-02 (×3): qty 1

## 2017-08-02 MED ORDER — CIPROFLOXACIN HCL 500 MG PO TABS
500.0000 mg | ORAL_TABLET | Freq: Two times a day (BID) | ORAL | Status: DC
  Administered 2017-08-02 – 2017-08-03 (×3): 500 mg via ORAL
  Filled 2017-08-02 (×3): qty 1

## 2017-08-02 MED ORDER — MAGNESIUM SULFATE 2 GM/50ML IV SOLN
2.0000 g | Freq: Once | INTRAVENOUS | Status: AC
  Administered 2017-08-02: 2 g via INTRAVENOUS
  Filled 2017-08-02: qty 50

## 2017-08-02 MED ORDER — POTASSIUM CHLORIDE CRYS ER 20 MEQ PO TBCR
40.0000 meq | EXTENDED_RELEASE_TABLET | ORAL | Status: AC
  Administered 2017-08-02 (×2): 40 meq via ORAL
  Filled 2017-08-02 (×2): qty 2

## 2017-08-02 NOTE — Consult Note (Signed)
   [facility] Inpatient Consult   08/02/2017  [place] 12/07/[id number]  Patient was assessed for [place] [facility] for community services in the [place]. Patient was previously active with [place] [facility] with EMMI stroke follow up.  Patient is from home and husband recently deceased per PT notes.  Patient with a HX with [facility]. Came to the unit and Patient currently not feeling well and is resting.  Will follow for progress and assess disposition needs, if applicable.  For additional questions or referrals please contact:  Natividad Brood, RN BSN [facility] Liaison  [phone] business mobile phone Toll free office [phone]

## 2017-08-02 NOTE — Progress Notes (Signed)
PROGRESS NOTE    [name] [name]   VQM:[id number]  DOB: 1926-01-25  DOA: 07/29/2017 PCP: Owens Loffler, MD   Brief Narrative:  [name] [name] 83 y.o.femalewith medical history significant of HTN, sCHF Last EF 40-50%, severe AS, Afib on Eliquis, AAA, CVA, COPD; who presents with complaint of chills. The patient and family members help provide history. At baseline patient is active and lives at home with 24-hour care. Patient reportedly had gotten up to use the restroom a few more times than usual overnight. This morning and noticed that her urine was dark colored. Patient had reportedly not been acting likeher normal self since early this morning. She was admitted to stepdown with severe sepsis due to urinary tract infection.     Subjective: Has a headache. Gets short of breath and feels weak when ambulating ROS: no complaints of nausea, vomiting, constipation diarrhea, cough or dysuria (has foley). No other complaints.   Assessment & Plan:   Principal Problem:   Severe sepsis  / UTI (urinary tract infection) due to Enterobacter - h/o Pseudomonas in 7/18 - temp 100.7, SBP in 70 and 80s on admission, WBC 31.3 - all of above improving - stop stress dose steroids today - will transition Cefepime to Cipro today for 14 day course - stop Oxybutynin to prevent urinary retention and further UTIs- if she has further issues, she should f/u with Urology  Active Problems:  Acute encephalopathy - due to above- has resolved  A-fib with RVR - HR is in 100s at rest and goes up to 130s with mild exertion - increase Metoprolol to 25 mg and follow BP and HR - cont Apixaban  Hypokalemia, hypomagnesemia - replace  Severe Ao Stenosis, and mild chronic systolic CHF - per last ECHO 1/18, EF about 45% - medical management    Essential hypertension - Lopressor   Protein-calorie malnutrition, severe ([place])    H/O: CVA (cerebrovascular accident) - Apixaban - statin    DVT  prophylaxis: Apixban Code Status: DNR Family Communication: daughter at bedside Disposition Plan: home when HR controlled Consultants:    Procedures:    Antimicrobials:  Anti-infectives    Start     Dose/Rate Route Frequency Ordered Stop   08/02/17 1000  ciprofloxacin (CIPRO) tablet 500 mg     500 mg Oral 2 times daily 08/02/17 0902 08/13/17 0759   07/30/17 1800  vancomycin (VANCOCIN) IVPB 750 mg/150 ml premix  Status:  Discontinued     750 mg 150 mL/hr over 60 Minutes Intravenous Every 24 hours 07/30/17 0020 07/30/17 0125   07/30/17 0030  piperacillin-tazobactam (ZOSYN) IVPB 3.375 g  Status:  Discontinued     3.375 g 100 mL/hr over 30 Minutes Intravenous  Once 07/30/17 0015 07/30/17 0019   07/30/17 0030  vancomycin (VANCOCIN) IVPB 1000 mg/200 mL premix  Status:  Discontinued     1,000 mg 200 mL/hr over 60 Minutes Intravenous  Once 07/30/17 0015 07/30/17 0018   07/30/17 0030  ceFEPIme (MAXIPIME) 2 g in dextrose 5 % 50 mL IVPB  Status:  Discontinued     2 g 100 mL/hr over 30 Minutes Intravenous Every 24 hours 07/30/17 0020 08/02/17 0902   07/29/17 1930  levofloxacin (LEVAQUIN) IVPB 500 mg     500 mg 100 mL/hr over 60 Minutes Intravenous  Once 07/29/17 1920 07/30/17 0025   07/29/17 1800  vancomycin (VANCOCIN) IVPB 1000 mg/200 mL premix     1,000 mg 200 mL/hr over 60 Minutes Intravenous  Once 07/29/17 1800 07/30/17  1696   07/29/17 1615  cefTRIAXone (ROCEPHIN) 1 g in dextrose 5 % 50 mL IVPB     1 g 100 mL/hr over 30 Minutes Intravenous  Once 07/29/17 1603 07/29/17 1749       Objective: Vitals:   08/01/17 1616 08/01/17 2140 08/02/17 0432 08/02/17 0948  BP: (!) 92/57 (!) 93/59 105/69 110/72  Pulse: 81 90 84 (!) 103  Resp: 19 18 18    Temp: 97.8 F (36.6 C) 97.6 F (36.4 C) 97.6 F (36.4 C)   TempSrc: Oral Oral Oral   SpO2: 98% 97% 97%   Weight:  49.9 kg (110 lb)    Height:        Intake/Output Summary (Last 24 hours) at 08/02/17 1442 Last data filed at 08/02/17  0434  Gross per 24 hour  Intake              620 ml  Output              450 ml  Net              170 ml   Filed Weights   07/31/17 0406 08/01/17 0500 08/01/17 2140  Weight: 48.7 kg (107 lb 5.8 oz) 49.9 kg (110 lb 0.2 oz) 49.9 kg (110 lb)    Examination: General exam: Appears comfortable  HEENT: PERRLA, oral mucosa moist, no sclera icterus or thrush Respiratory system: Clear to auscultation. Respiratory effort normal. Cardiovascular system: S1 & S2 heard, IIRR. Gastrointestinal system: Abdomen soft, non-tender, nondistended. Normal bowel sound. No organomegaly Central nervous system: Alert and oriented. No focal neurological deficits. Extremities: No cyanosis, clubbing or edema Skin: No rashes or ulcers Psychiatry:  Mood & affect appropriate.     Data Reviewed: I have personally reviewed following labs and imaging studies  CBC:  Recent Labs Lab 07/29/17 1522 07/30/17 0329 07/31/17 0309 08/01/17 0447 08/02/17 0242  WBC 19.4* 31.3* 30.6* 25.0* 18.6*  HGB 10.4* 9.0* 9.4* 9.4* 9.4*  HCT 34.0* 29.1* 29.3* 29.8* 29.7*  MCV 97.4 96.7 96.4 95.5 95.2  PLT 154 154 156 170 789   Basic Metabolic Panel:  Recent Labs Lab 07/29/17 1522 07/30/17 0329 07/31/17 0309 08/01/17 0447 08/02/17 0242  NA 141 139 140 142 142  K 3.8 3.9 3.7 3.3* 3.3*  CL 113* 112* 115* 115* 114*  CO2 21* 19* 18* 21* 22  GLUCOSE 82 104* 130* 106* 107*  BUN 19 20 22* 24* 23*  CREATININE 0.83 0.77 0.77 0.65 0.66  CALCIUM 7.7* 7.3* 7.6* 7.9* 7.9*  MG  --   --   --   --  1.6*   GFR: Estimated Creatinine Clearance: 34.2 mL/min (by C-G formula based on SCr of 0.66 mg/dL). Liver Function Tests:  Recent Labs Lab 07/29/17 1522 07/30/17 0329 08/01/17 0447  AST 226* 107* 25  ALT 99* 68* 30  ALKPHOS 140* 112 109  BILITOT 0.8 0.5 0.4  PROT 5.1* 4.8* 4.7*  ALBUMIN 2.4* 2.1* 1.9*   No results for input(s): LIPASE, AMYLASE in the last 168 hours. No results for input(s): AMMONIA in the last 168  hours. Coagulation Profile: No results for input(s): INR, PROTIME in the last 168 hours. Cardiac Enzymes: No results for input(s): CKTOTAL, CKMB, CKMBINDEX, TROPONINI in the last 168 hours. BNP (last 3 results) No results for input(s): PROBNP in the last 8760 hours. HbA1C: No results for input(s): HGBA1C in the last 72 hours. CBG: No results for input(s): GLUCAP in the last 168 hours. Lipid Profile: No results  for input(s): CHOL, HDL, LDLCALC, TRIG, CHOLHDL, LDLDIRECT in the last 72 hours. Thyroid Function Tests: No results for input(s): TSH, T4TOTAL, FREET4, T3FREE, THYROIDAB in the last 72 hours. Anemia Panel: No results for input(s): VITAMINB12, FOLATE, FERRITIN, TIBC, IRON, RETICCTPCT in the last 72 hours. Urine analysis:    Component Value Date/Time   COLORURINE AMBER (A) 07/29/2017 1935   APPEARANCEUR HAZY (A) 07/29/2017 1935   LABSPEC 1.016 07/29/2017 1935   PHURINE 5.0 07/29/2017 1935   GLUCOSEU NEGATIVE 07/29/2017 1935   GLUCOSEU NEGATIVE 05/08/2017 1147   HGBUR LARGE (A) 07/29/2017 [place] NEGATIVE 07/29/2017 1935   BILIRUBINUR negative 11/22/2016 1435   KETONESUR NEGATIVE 07/29/2017 1935   PROTEINUR 30 (A) 07/29/2017 1935   UROBILINOGEN 0.2 05/08/2017 1147   NITRITE NEGATIVE 07/29/2017 1935   LEUKOCYTESUR NEGATIVE 07/29/2017 1935   Sepsis Labs: @LABRCNTIP (procalcitonin:4,lacticidven:4) ) Recent Results (from the past 240 hour(s))  Blood culture (routine x 2)     Status: Abnormal   Collection Time: 07/29/17  4:53 PM  Result Value Ref Range Status   Specimen Description BLOOD RIGHT FOREARM  Final   Special Requests   Final    BOTTLES DRAWN AEROBIC ONLY Blood Culture adequate volume   Culture  Setup Time   Final    GRAM NEGATIVE RODS AEROBIC BOTTLE ONLY CRITICAL RESULT CALLED TO, READ BACK BY AND VERIFIED WITH: R RUMBARGER,PHARMD AT 1027 07/30/17 BY L BENFIELD    Culture ENTEROBACTER AEROGENES (A)  Final   Report Status 08/01/2017 FINAL  Final    Organism ID, Bacteria ENTEROBACTER AEROGENES  Final      Susceptibility   Enterobacter aerogenes - MIC*    CEFAZOLIN RESISTANT Resistant     CEFEPIME <=1 SENSITIVE Sensitive     CEFTAZIDIME <=1 SENSITIVE Sensitive     CEFTRIAXONE <=1 SENSITIVE Sensitive     CIPROFLOXACIN <=0.25 SENSITIVE Sensitive     GENTAMICIN <=1 SENSITIVE Sensitive     IMIPENEM 2 SENSITIVE Sensitive     TRIMETH/SULFA <=20 SENSITIVE Sensitive     PIP/TAZO 8 SENSITIVE Sensitive     * ENTEROBACTER AEROGENES  Blood Culture ID Panel (Reflexed)     Status: Abnormal   Collection Time: 07/29/17  4:53 PM  Result Value Ref Range Status   Enterococcus species NOT DETECTED NOT DETECTED Final   Listeria monocytogenes NOT DETECTED NOT DETECTED Final   Staphylococcus species NOT DETECTED NOT DETECTED Final   Staphylococcus aureus NOT DETECTED NOT DETECTED Final   Streptococcus species NOT DETECTED NOT DETECTED Final   Streptococcus agalactiae NOT DETECTED NOT DETECTED Final   Streptococcus pneumoniae NOT DETECTED NOT DETECTED Final   Streptococcus pyogenes NOT DETECTED NOT DETECTED Final   Acinetobacter baumannii NOT DETECTED NOT DETECTED Final   Enterobacteriaceae species DETECTED (A) NOT DETECTED Final    Comment: Enterobacteriaceae represent a large family of gram negative bacteria, not a single organism. Refer to culture for further identification. CRITICAL RESULT CALLED TO, READ BACK BY AND VERIFIED WITH: R RUMBARGER,PHARMD AT 1027 07/30/17 BY L BENFIELD    Enterobacter cloacae complex NOT DETECTED NOT DETECTED Final   Escherichia coli NOT DETECTED NOT DETECTED Final   Klebsiella oxytoca NOT DETECTED NOT DETECTED Final   Klebsiella pneumoniae NOT DETECTED NOT DETECTED Final   Proteus species NOT DETECTED NOT DETECTED Final   Serratia marcescens NOT DETECTED NOT DETECTED Final   Carbapenem resistance NOT DETECTED NOT DETECTED Final   Haemophilus influenzae NOT DETECTED NOT DETECTED Final   Neisseria meningitidis NOT  DETECTED NOT  DETECTED Final   Pseudomonas aeruginosa NOT DETECTED NOT DETECTED Final   Candida albicans NOT DETECTED NOT DETECTED Final   Candida glabrata NOT DETECTED NOT DETECTED Final   Candida krusei NOT DETECTED NOT DETECTED Final   Candida parapsilosis NOT DETECTED NOT DETECTED Final   Candida tropicalis NOT DETECTED NOT DETECTED Final  Blood culture (routine x 2)     Status: Abnormal   Collection Time: 07/29/17  5:03 PM  Result Value Ref Range Status   Specimen Description BLOOD RIGHT ANTECUBITAL  Final   Special Requests   Final    BOTTLES DRAWN AEROBIC ONLY Blood Culture adequate volume   Culture  Setup Time   Final    GRAM NEGATIVE RODS AEROBIC BOTTLE ONLY CRITICAL VALUE NOTED.  VALUE IS CONSISTENT WITH PREVIOUSLY REPORTED AND CALLED VALUE.    Culture (A)  Final    ENTEROBACTER AEROGENES SUSCEPTIBILITIES PERFORMED ON PREVIOUS CULTURE WITHIN THE LAST 5 DAYS.    Report Status 08/02/2017 FINAL  Final  Urine culture     Status: Abnormal   Collection Time: 07/29/17  7:35 PM  Result Value Ref Range Status   Specimen Description Urine  Final   Special Requests NONE  Final   Culture >=100,000 COLONIES/mL ENTEROBACTER AEROGENES (A)  Final   Report Status 08/01/2017 FINAL  Final   Organism ID, Bacteria ENTEROBACTER AEROGENES (A)  Final      Susceptibility   Enterobacter aerogenes - MIC*    CEFAZOLIN RESISTANT Resistant     CEFEPIME <=1 SENSITIVE Sensitive     CEFTAZIDIME <=1 SENSITIVE Sensitive     CEFTRIAXONE <=1 SENSITIVE Sensitive     CIPROFLOXACIN <=0.25 SENSITIVE Sensitive     GENTAMICIN <=1 SENSITIVE Sensitive     IMIPENEM 1 SENSITIVE Sensitive     TRIMETH/SULFA <=20 SENSITIVE Sensitive     PIP/TAZO <=4 SENSITIVE Sensitive     * >=100,000 COLONIES/mL ENTEROBACTER AEROGENES  MRSA PCR Screening     Status: None   Collection Time: 07/30/17 10:20 AM  Result Value Ref Range Status   MRSA by PCR NEGATIVE NEGATIVE Final    Comment:        The GeneXpert MRSA Assay  (FDA approved for NASAL specimens only), is one component of a comprehensive MRSA colonization surveillance program. It is not intended to diagnose MRSA infection nor to guide or monitor treatment for MRSA infections.      Radiology Studies: No results found.  Scheduled Meds: . apixaban  2.5 mg Oral BID  . atorvastatin  10 mg Oral Daily  . ciprofloxacin  500 mg Oral BID  . levothyroxine  25 mcg Oral QAC breakfast  . metoprolol tartrate  25 mg Oral BID  . potassium chloride  40 mEq Oral Q4H   Continuous Infusions:   LOS: 3 days    Time spent in minutes: 35    Debbe Odea, MD [facility] Pager: [email or web address] Password [email or web address] 08/02/2017, 2:42 PM

## 2017-08-02 NOTE — Progress Notes (Signed)
PT Cancellation Note  Patient Details Name: [name] [name] MRN: [id number] DOB: 04-24-1926   Cancelled Treatment:    Reason Eval/Treat Not Completed: Pain limiting ability to participate;Fatigue/lethargy limiting ability to participate.  States her HA is persistent but has received meds, and is sleeping poorly for last 4 nights.  Will try again tomorrow.   Ramond Dial 08/02/2017, 11:48 AM   Mee Hives, PT MS Acute Rehab Dept. Number: [place] and [place]

## 2017-08-02 NOTE — Progress Notes (Signed)
Pharmacy Antibiotic Note  [name] [name] is a 83 y.o. female admitted on 07/29/2017 with UTI and bacteremia.  Pharmacy has been consulted for PO cipro dosing  Plan: Ciprofloxacin 500mg  PO BID for 11 more days Follow renal function- if stable tomorrow, pharmacy to sign off   Height: 5' (152.4 cm) Weight: 110 lb (49.9 kg) IBW/kg (Calculated) : 45.5  Temp (24hrs), Avg:97.7 F (36.5 C), Min:97.6 F (36.4 C), Max:97.8 F (36.6 C)   Recent Labs Lab 07/29/17 1522 07/29/17 1536 07/29/17 2045 07/30/17 0329 07/31/17 0309 08/01/17 0447 08/02/17 0242  WBC 19.4*  --   --  31.3* 30.6* 25.0* 18.6*  CREATININE 0.83  --   --  0.77 0.77 0.65 0.66  LATICACIDVEN  --  2.13* 2.65* 1.2  --   --   --     Estimated Creatinine Clearance: 34.2 mL/min (by C-G formula based on SCr of 0.66 mg/dL).    No Known Allergies ctx 10/13 x1 levaquin 10/13 x1 vancomycin 10/13 x1 cefepime 10/14> 10/17 cipro 10/17 > (10/27)  10/13 urine: enterobacter aerogenes, R to Ancef only 10/13 blood cx: 1/2 enterobacter aerogenes, R to Ancef only 10/14 mrsa pcr: neg  Artin Mceuen D. Aasiyah Auerbach, PharmD, BCPS Clinical Pharmacist Pager: [phone] Clinical Phone for 08/02/2017 until 3:30pm: [phone] If after 3:30pm, please call main pharmacy at [phone] 08/02/2017 12:41 PM

## 2017-08-03 DIAGNOSIS — Z23 Encounter for immunization: Secondary | ICD-10-CM | POA: Diagnosis not present

## 2017-08-03 LAB — BASIC METABOLIC PANEL
ANION GAP: 5 (ref 5–15)
BUN: 20 mg/dL (ref 6–20)
CHLORIDE: 115 mmol/L — AB (ref 101–111)
CO2: 21 mmol/L — ABNORMAL LOW (ref 22–32)
Calcium: 8.3 mg/dL — ABNORMAL LOW (ref 8.9–10.3)
Creatinine, Ser: 0.66 mg/dL (ref 0.44–1.00)
GFR calc Af Amer: >60 mL/min (ref >60)
GLUCOSE: 104 mg/dL — AB (ref 65–99)
POTASSIUM: 4.3 mmol/L (ref 3.5–5.1)
Sodium: 141 mmol/L (ref 135–145)

## 2017-08-03 LAB — CBC
HEMATOCRIT: 32 % — AB (ref 36.0–46.0)
HEMOGLOBIN: 10 g/dL — AB (ref 12.0–15.0)
MCH: 29.9 pg (ref 26.0–34.0)
MCHC: 31.3 g/dL (ref 30.0–36.0)
MCV: 95.8 fL (ref 78.0–100.0)
Platelets: 180 10*3/uL (ref 150–400)
RBC: 3.34 MIL/uL — AB (ref 3.87–5.11)
RDW: 16.6 % — ABNORMAL HIGH (ref 11.5–15.5)
WBC: 11.2 10*3/uL — ABNORMAL HIGH (ref 4.0–10.5)

## 2017-08-03 LAB — MAGNESIUM: MAGNESIUM: 2 mg/dL (ref 1.7–2.4)

## 2017-08-03 MED ORDER — MAGNESIUM OXIDE 400 (241.3 MG) MG PO TABS: 400.0000 mg | ORAL_TABLET | Freq: Every day | ORAL | 0 refills | Status: AC

## 2017-08-03 MED ORDER — ENSURE ORIGINAL PO LIQD: 1.0000 | Freq: Two times a day (BID) | ORAL | 0 refills | Status: DC

## 2017-08-03 MED ORDER — METOPROLOL TARTRATE 25 MG PO TABS: 25.0000 mg | ORAL_TABLET | Freq: Two times a day (BID) | ORAL | 3 refills | Status: DC

## 2017-08-03 MED ORDER — LEVOFLOXACIN 500 MG PO TABS: 500.0000 mg | ORAL_TABLET | Freq: Every day | ORAL | 0 refills | Status: DC

## 2017-08-03 MED ORDER — ACETAMINOPHEN 325 MG PO TABS: 650.0000 mg | ORAL_TABLET | Freq: Four times a day (QID) | ORAL | Status: AC | PRN

## 2017-08-03 NOTE — Discharge Summary (Signed)
Physician Discharge Summary  Essence [name] [name] AYT:[id number] DOB: [birth year] DOA: 07/29/2017  PCP: Owens Loffler, MD  Admit date: 07/29/2017 Discharge date: 08/03/2017  Admitted From: home  Disposition:  home   Recommendations for Outpatient Follow-up:  1. Referral to urology as needed 2. F/u BP and HR on increased dose of Lopressor 3. Bmet in 1-2 wks to f/u on K and sodium, Mg level as well  Home Health:  ordered  Equipment/Devices:  Rolling walker    Discharge Condition:  stable   CODE STATUS:  DNR   Consultations:  none    Discharge Diagnoses:  Principal Problem:   Severe sepsis/  UTI (urinary tract infection) Active Problems:   Essential hypertension   Protein-calorie malnutrition, severe (HCC)   H/O: CVA (cerebrovascular accident)   Chronic systolic CHF (congestive heart failure) (HCC)   Pressure injury of skin    Subjective: No complaints of dysuria, vomiting, diarrhea, constipation, cough or congestion.   Brief Summary: [name] [name] 83 y.o.femalewith medical history significant of HTN, sCHF Last EF 40-50%, severe AS, Afib on Eliquis, AAA, CVA, COPD; who presents with complaint of chills. The patient and family members help provide history. At baseline patient is active and lives at home with 24-hour care. Patient reportedly had gotten up to use the restroom a few more times than usual overnight. This morning and noticed that her urine was dark colored. Patient had reportedly not been acting likeher normal self since early this morning. She was admitted to stepdown with severe sepsis due to urinary tract infection.     Hospital Course:  Principal Problem:   Severe sepsis  / UTI (urinary tract infection) due to Enterobacter -  She was admitted for Pseudomonas UTI and sepsis in 7/18 - temp 100.7, SBP in 70 and 80s on admission, WBC 31.3, Lactic acid 2.65 - all of above improving - stopped stress dose steroids 10/17- she continues to be stable - 10/17  transitioned from Cefepime to Cipro for 14 day course - as this is her 2nd severe UTI/sepsis, will completely stop Oxybutynin to prevent urinary retention and further UTIs- if she has further issues, she should f/u with Urology- d/w patient and daughter  Active Problems:  Acute encephalopathy - due to above- has resolved  A-fib with RVR -10/17  HR was in 100s at rest and goes up to 130s with mild exertion - increased Metoprolol to 25 mg - HR improved to normal - BP stable in 100/60s  - cont Apixaban  Hypokalemia, hypomagnesemia - replaced adequately- daily Mg+ prescription given  Severe Ao Stenosis, and mild chronic systolic CHF - per last ECHO 1/18, EF about 45% - medical management    Essential hypertension - Lopressor    Protein-calorie malnutrition, severe ([place])    H/O: CVA (cerebrovascular accident) - Apixaban - statin   Discharge Instructions  Discharge Instructions    Diet - low sodium heart healthy    Complete by:  As directed    Increase activity slowly    Complete by:  As directed      Allergies as of 08/03/2017   No Known Allergies     Medication List    STOP taking these medications   ciprofloxacin 500 MG tablet Commonly known as:  CIPRO Replaced by:  levofloxacin 500 MG tablet   oxybutynin 5 MG tablet Commonly known as:  DITROPAN     TAKE these medications   acetaminophen 325 MG tablet Commonly known as:  TYLENOL Take 2 tablets (650 mg  total) by mouth every 6 (six) hours as needed for mild pain (or Fever >/= 101).   ANACIN 400-32 MG Tabs Generic drug:  Aspirin-Caffeine Take 2 tablets by mouth daily as needed (eadache).   apixaban 2.5 MG Tabs tablet Commonly known as:  ELIQUIS Take 1 tablet (2.5 mg total) by mouth 2 (two) times daily.   atorvastatin 10 MG tablet Commonly known as:  LIPITOR Take 1 tablet (10 mg total) by mouth daily.   cyanocobalamin 1000 MCG/ML injection Commonly known as:  (VITAMIN B-12) INJECT 1 ML  INTERMUSCLUARLLY ONCE A MONTH. What changed:  how much to take  how to take this  when to take this  additional instructions   ENSURE ORIGINAL Liqd Take 1 Bottle by mouth 2 (two) times daily between meals.   levofloxacin 500 MG tablet Commonly known as:  LEVAQUIN Take 1 tablet (500 mg total) by mouth daily. Replaces:  ciprofloxacin 500 MG tablet   levothyroxine 25 MCG tablet Commonly known as:  SYNTHROID, LEVOTHROID Take 1 tablet (25 mcg total) by mouth daily before breakfast.   magnesium oxide 400 (241.3 Mg) MG tablet Commonly known as:  MAG-OX Take 1 tablet (400 mg total) by mouth daily.   metoprolol tartrate 25 MG tablet Commonly known as:  LOPRESSOR Take 1 tablet (25 mg total) by mouth 2 (two) times daily. What changed:  how much to take   multivitamin with minerals Tabs tablet Take 1 tablet by mouth daily.   nystatin cream Commonly known as:  MYCOSTATIN APPLY 1 APPLICATION TOPICALLY 2 (TWO) TIMES DAILY What changed:  when to take this  reasons to take this   PRESERVISION AREDS 2 Caps Take 1 capsule by mouth 2 (two) times daily.            Durable Medical Equipment        Start     Ordered   08/03/17 0932  For home use only DME Walker rolling  Once    Question:  Patient needs a walker to treat with the following condition  Answer:  Debilitated   08/03/17 0931     Follow-up Information    Copland, Frederico Hamman, MD Follow up in 1 week(s).   Specialty:  Family Medicine Why:  BP and heart rate check Contact information: [place] [address] [phone]        Pa, [facility] Follow up.   Why:  as needed for urinary issues Contact information: [address]  [place] [address] [place] [address] [phone]          No Known Allergies      Dg Chest Port 1 View  Result Date: 07/29/2017 CLINICAL DATA:  83 y/o  F; sepsis. EXAM: PORTABLE CHEST 1 VIEW COMPARISON:  04/22/2017 chest radiograph FINDINGS:  Stable mildly enlarged cardiac silhouette. Aortic atherosclerosis with calcification. Elevated left hemidiaphragm and left basilar opacity probably representing associated atelectasis. Pneumonia is possible. No pleural effusion or pneumothorax. Mild dextrocurvature of the thoracic spine. IMPRESSION: Left basilar opacity may represent pneumonia or atelectasis associated with elevated left hemidiaphragm. Electronically Signed   By: Kristine Garbe M.D.   On: 07/29/2017 18:56       Discharge Exam: Vitals:   08/03/17 0429 08/03/17 0917  BP: 103/68 105/67  Pulse: 75 (!) 102  Resp: 14 13  Temp: 97.8 F (36.6 C) 97.9 F (36.6 C)  SpO2: 98% 97%   Vitals:   08/02/17 0948 08/02/17 2008 08/03/17 0429 08/03/17 0917  BP:  110/72 107/69 103/68 105/67  Pulse: (!) 103 75 75 (!) 102  Resp:  16 14 13   Temp:  (!) 97.4 F (36.3 C) 97.8 F (36.6 C) 97.9 F (36.6 C)  TempSrc:    Oral  SpO2:  99% 98% 97%  Weight:  49 kg (108 lb)    Height:        General: Pt is alert, awake, not in acute distress Cardiovascular: IIRR, S1/S2 +  Respiratory: CTA bilaterally, no wheezing, no rhonchi Abdominal: Soft, NT, ND, bowel sounds + Extremities: no edema, no cyanosis    The results of significant diagnostics from this hospitalization (including imaging, microbiology, ancillary and laboratory) are listed below for reference.     Microbiology: Recent Results (from the past 240 hour(s))  Blood culture (routine x 2)     Status: Abnormal   Collection Time: 07/29/17  4:53 PM  Result Value Ref Range Status   Specimen Description BLOOD RIGHT FOREARM  Final   Special Requests   Final    BOTTLES DRAWN AEROBIC ONLY Blood Culture adequate volume   Culture  Setup Time   Final    GRAM NEGATIVE RODS AEROBIC BOTTLE ONLY CRITICAL RESULT CALLED TO, READ BACK BY AND VERIFIED WITH: R RUMBARGER,PHARMD AT 1027 07/30/17 BY L BENFIELD    Culture ENTEROBACTER AEROGENES (A)  Final   Report Status 08/01/2017 FINAL   Final   Organism ID, Bacteria ENTEROBACTER AEROGENES  Final      Susceptibility   Enterobacter aerogenes - MIC*    CEFAZOLIN RESISTANT Resistant     CEFEPIME <=1 SENSITIVE Sensitive     CEFTAZIDIME <=1 SENSITIVE Sensitive     CEFTRIAXONE <=1 SENSITIVE Sensitive     CIPROFLOXACIN <=0.25 SENSITIVE Sensitive     GENTAMICIN <=1 SENSITIVE Sensitive     IMIPENEM 2 SENSITIVE Sensitive     TRIMETH/SULFA <=20 SENSITIVE Sensitive     PIP/TAZO 8 SENSITIVE Sensitive     * ENTEROBACTER AEROGENES  Blood Culture ID Panel (Reflexed)     Status: Abnormal   Collection Time: 07/29/17  4:53 PM  Result Value Ref Range Status   Enterococcus species NOT DETECTED NOT DETECTED Final   Listeria monocytogenes NOT DETECTED NOT DETECTED Final   Staphylococcus species NOT DETECTED NOT DETECTED Final   Staphylococcus aureus NOT DETECTED NOT DETECTED Final   Streptococcus species NOT DETECTED NOT DETECTED Final   Streptococcus agalactiae NOT DETECTED NOT DETECTED Final   Streptococcus pneumoniae NOT DETECTED NOT DETECTED Final   Streptococcus pyogenes NOT DETECTED NOT DETECTED Final   Acinetobacter baumannii NOT DETECTED NOT DETECTED Final   Enterobacteriaceae species DETECTED (A) NOT DETECTED Final    Comment: Enterobacteriaceae represent a large family of gram negative bacteria, not a single organism. Refer to culture for further identification. CRITICAL RESULT CALLED TO, READ BACK BY AND VERIFIED WITH: R RUMBARGER,PHARMD AT 1027 07/30/17 BY L BENFIELD    Enterobacter cloacae complex NOT DETECTED NOT DETECTED Final   Escherichia coli NOT DETECTED NOT DETECTED Final   Klebsiella oxytoca NOT DETECTED NOT DETECTED Final   Klebsiella pneumoniae NOT DETECTED NOT DETECTED Final   Proteus species NOT DETECTED NOT DETECTED Final   Serratia marcescens NOT DETECTED NOT DETECTED Final   Carbapenem resistance NOT DETECTED NOT DETECTED Final   Haemophilus influenzae NOT DETECTED NOT DETECTED Final   Neisseria  meningitidis NOT DETECTED NOT DETECTED Final   Pseudomonas aeruginosa NOT DETECTED NOT DETECTED Final   Candida albicans NOT DETECTED NOT DETECTED Final   Candida glabrata NOT  DETECTED NOT DETECTED Final   Candida krusei NOT DETECTED NOT DETECTED Final   Candida parapsilosis NOT DETECTED NOT DETECTED Final   Candida tropicalis NOT DETECTED NOT DETECTED Final  Blood culture (routine x 2)     Status: Abnormal   Collection Time: 07/29/17  5:03 PM  Result Value Ref Range Status   Specimen Description BLOOD RIGHT ANTECUBITAL  Final   Special Requests   Final    BOTTLES DRAWN AEROBIC ONLY Blood Culture adequate volume   Culture  Setup Time   Final    GRAM NEGATIVE RODS AEROBIC BOTTLE ONLY CRITICAL VALUE NOTED.  VALUE IS CONSISTENT WITH PREVIOUSLY REPORTED AND CALLED VALUE.    Culture (A)  Final    ENTEROBACTER AEROGENES SUSCEPTIBILITIES PERFORMED ON PREVIOUS CULTURE WITHIN THE LAST 5 DAYS.    Report Status 08/02/2017 FINAL  Final  Urine culture     Status: Abnormal   Collection Time: 07/29/17  7:35 PM  Result Value Ref Range Status   Specimen Description Urine  Final   Special Requests NONE  Final   Culture >=100,000 COLONIES/mL ENTEROBACTER AEROGENES (A)  Final   Report Status 08/01/2017 FINAL  Final   Organism ID, Bacteria ENTEROBACTER AEROGENES (A)  Final      Susceptibility   Enterobacter aerogenes - MIC*    CEFAZOLIN RESISTANT Resistant     CEFEPIME <=1 SENSITIVE Sensitive     CEFTAZIDIME <=1 SENSITIVE Sensitive     CEFTRIAXONE <=1 SENSITIVE Sensitive     CIPROFLOXACIN <=0.25 SENSITIVE Sensitive     GENTAMICIN <=1 SENSITIVE Sensitive     IMIPENEM 1 SENSITIVE Sensitive     TRIMETH/SULFA <=20 SENSITIVE Sensitive     PIP/TAZO <=4 SENSITIVE Sensitive     * >=100,000 COLONIES/mL ENTEROBACTER AEROGENES  MRSA PCR Screening     Status: None   Collection Time: 07/30/17 10:20 AM  Result Value Ref Range Status   MRSA by PCR NEGATIVE NEGATIVE Final    Comment:        The GeneXpert  MRSA Assay (FDA approved for NASAL specimens only), is one component of a comprehensive MRSA colonization surveillance program. It is not intended to diagnose MRSA infection nor to guide or monitor treatment for MRSA infections.   Culture, blood (routine x 2)     Status: None (Preliminary result)   Collection Time: 08/01/17  3:01 PM  Result Value Ref Range Status   Specimen Description BLOOD LEFT HAND  Final   Special Requests IN PEDIATRIC BOTTLE Blood Culture adequate volume  Final   Culture NO GROWTH < 24 HOURS  Final   Report Status PENDING  Incomplete  Culture, blood (routine x 2)     Status: None (Preliminary result)   Collection Time: 08/01/17  3:01 PM  Result Value Ref Range Status   Specimen Description BLOOD RIGHT HAND  Final   Special Requests IN PEDIATRIC BOTTLE Blood Culture adequate volume  Final   Culture NO GROWTH < 24 HOURS  Final   Report Status PENDING  Incomplete     Labs: BNP (last 3 results)  Recent Labs  07/29/17 1522  BNP 9,937.1*   Basic Metabolic Panel:  Recent Labs Lab 07/30/17 0329 07/31/17 0309 08/01/17 0447 08/02/17 0242 08/03/17 0328  NA 139 140 142 142 141  K 3.9 3.7 3.3* 3.3* 4.3  CL 112* 115* 115* 114* 115*  CO2 19* 18* 21* 22 21*  GLUCOSE 104* 130* 106* 107* 104*  BUN 20 22* 24* 23* 20  CREATININE 0.77 0.77  0.65 0.66 0.66  CALCIUM 7.3* 7.6* 7.9* 7.9* 8.3*  MG  --   --   --  1.6* 2.0   Liver Function Tests:  Recent Labs Lab 07/29/17 1522 07/30/17 0329 08/01/17 0447  AST 226* 107* 25  ALT 99* 68* 30  ALKPHOS 140* 112 109  BILITOT 0.8 0.5 0.4  PROT 5.1* 4.8* 4.7*  ALBUMIN 2.4* 2.1* 1.9*   No results for input(s): LIPASE, AMYLASE in the last 168 hours. No results for input(s): AMMONIA in the last 168 hours. CBC:  Recent Labs Lab 07/30/17 0329 07/31/17 0309 08/01/17 0447 08/02/17 0242 08/03/17 0328  WBC 31.3* 30.6* 25.0* 18.6* 11.2*  HGB 9.0* 9.4* 9.4* 9.4* 10.0*  HCT 29.1* 29.3* 29.8* 29.7* 32.0*  MCV 96.7  96.4 95.5 95.2 95.8  PLT 154 156 170 177 180   Cardiac Enzymes: No results for input(s): CKTOTAL, CKMB, CKMBINDEX, TROPONINI in the last 168 hours. BNP: Invalid input(s): POCBNP CBG: No results for input(s): GLUCAP in the last 168 hours. D-Dimer No results for input(s): DDIMER in the last 72 hours. Hgb A1c No results for input(s): HGBA1C in the last 72 hours. Lipid Profile No results for input(s): CHOL, HDL, LDLCALC, TRIG, CHOLHDL, LDLDIRECT in the last 72 hours. Thyroid function studies No results for input(s): TSH, T4TOTAL, T3FREE, THYROIDAB in the last 72 hours.  Invalid input(s): FREET3 Anemia work up No results for input(s): VITAMINB12, FOLATE, FERRITIN, TIBC, IRON, RETICCTPCT in the last 72 hours. Urinalysis    Component Value Date/Time   COLORURINE AMBER (A) 07/29/2017 1935   APPEARANCEUR HAZY (A) 07/29/2017 1935   LABSPEC 1.016 07/29/2017 1935   PHURINE 5.0 07/29/2017 1935   GLUCOSEU NEGATIVE 07/29/2017 1935   GLUCOSEU NEGATIVE 05/08/2017 1147   HGBUR LARGE (A) 07/29/2017 1935   BILIRUBINUR NEGATIVE 07/29/2017 1935   BILIRUBINUR negative 11/22/2016 1435   KETONESUR NEGATIVE 07/29/2017 1935   PROTEINUR 30 (A) 07/29/2017 1935   UROBILINOGEN 0.2 05/08/2017 1147   NITRITE NEGATIVE 07/29/2017 1935   LEUKOCYTESUR NEGATIVE 07/29/2017 1935   Sepsis Labs Invalid input(s): PROCALCITONIN,  WBC,  LACTICIDVEN Microbiology Recent Results (from the past 240 hour(s))  Blood culture (routine x 2)     Status: Abnormal   Collection Time: 07/29/17  4:53 PM  Result Value Ref Range Status   Specimen Description BLOOD RIGHT FOREARM  Final   Special Requests   Final    BOTTLES DRAWN AEROBIC ONLY Blood Culture adequate volume   Culture  Setup Time   Final    GRAM NEGATIVE RODS AEROBIC BOTTLE ONLY CRITICAL RESULT CALLED TO, READ BACK BY AND VERIFIED WITH: R RUMBARGER,PHARMD AT 1027 07/30/17 BY L BENFIELD    Culture ENTEROBACTER AEROGENES (A)  Final   Report Status 08/01/2017  FINAL  Final   Organism ID, Bacteria ENTEROBACTER AEROGENES  Final      Susceptibility   Enterobacter aerogenes - MIC*    CEFAZOLIN RESISTANT Resistant     CEFEPIME <=1 SENSITIVE Sensitive     CEFTAZIDIME <=1 SENSITIVE Sensitive     CEFTRIAXONE <=1 SENSITIVE Sensitive     CIPROFLOXACIN <=0.25 SENSITIVE Sensitive     GENTAMICIN <=1 SENSITIVE Sensitive     IMIPENEM 2 SENSITIVE Sensitive     TRIMETH/SULFA <=20 SENSITIVE Sensitive     PIP/TAZO 8 SENSITIVE Sensitive     * ENTEROBACTER AEROGENES  Blood Culture ID Panel (Reflexed)     Status: Abnormal   Collection Time: 07/29/17  4:53 PM  Result Value Ref Range Status   Enterococcus  species NOT DETECTED NOT DETECTED Final   Listeria monocytogenes NOT DETECTED NOT DETECTED Final   Staphylococcus species NOT DETECTED NOT DETECTED Final   Staphylococcus aureus NOT DETECTED NOT DETECTED Final   Streptococcus species NOT DETECTED NOT DETECTED Final   Streptococcus agalactiae NOT DETECTED NOT DETECTED Final   Streptococcus pneumoniae NOT DETECTED NOT DETECTED Final   Streptococcus pyogenes NOT DETECTED NOT DETECTED Final   Acinetobacter baumannii NOT DETECTED NOT DETECTED Final   Enterobacteriaceae species DETECTED (A) NOT DETECTED Final    Comment: Enterobacteriaceae represent a large family of gram negative bacteria, not a single organism. Refer to culture for further identification. CRITICAL RESULT CALLED TO, READ BACK BY AND VERIFIED WITH: R RUMBARGER,PHARMD AT 1027 07/30/17 BY L BENFIELD    Enterobacter cloacae complex NOT DETECTED NOT DETECTED Final   Escherichia coli NOT DETECTED NOT DETECTED Final   Klebsiella oxytoca NOT DETECTED NOT DETECTED Final   Klebsiella pneumoniae NOT DETECTED NOT DETECTED Final   Proteus species NOT DETECTED NOT DETECTED Final   Serratia marcescens NOT DETECTED NOT DETECTED Final   Carbapenem resistance NOT DETECTED NOT DETECTED Final   Haemophilus influenzae NOT DETECTED NOT DETECTED Final   Neisseria  meningitidis NOT DETECTED NOT DETECTED Final   Pseudomonas aeruginosa NOT DETECTED NOT DETECTED Final   Candida albicans NOT DETECTED NOT DETECTED Final   Candida glabrata NOT DETECTED NOT DETECTED Final   Candida krusei NOT DETECTED NOT DETECTED Final   Candida parapsilosis NOT DETECTED NOT DETECTED Final   Candida tropicalis NOT DETECTED NOT DETECTED Final  Blood culture (routine x 2)     Status: Abnormal   Collection Time: 07/29/17  5:03 PM  Result Value Ref Range Status   Specimen Description BLOOD RIGHT ANTECUBITAL  Final   Special Requests   Final    BOTTLES DRAWN AEROBIC ONLY Blood Culture adequate volume   Culture  Setup Time   Final    GRAM NEGATIVE RODS AEROBIC BOTTLE ONLY CRITICAL VALUE NOTED.  VALUE IS CONSISTENT WITH PREVIOUSLY REPORTED AND CALLED VALUE.    Culture (A)  Final    ENTEROBACTER AEROGENES SUSCEPTIBILITIES PERFORMED ON PREVIOUS CULTURE WITHIN THE LAST 5 DAYS.    Report Status 08/02/2017 FINAL  Final  Urine culture     Status: Abnormal   Collection Time: 07/29/17  7:35 PM  Result Value Ref Range Status   Specimen Description Urine  Final   Special Requests NONE  Final   Culture >=100,000 COLONIES/mL ENTEROBACTER AEROGENES (A)  Final   Report Status 08/01/2017 FINAL  Final   Organism ID, Bacteria ENTEROBACTER AEROGENES (A)  Final      Susceptibility   Enterobacter aerogenes - MIC*    CEFAZOLIN RESISTANT Resistant     CEFEPIME <=1 SENSITIVE Sensitive     CEFTAZIDIME <=1 SENSITIVE Sensitive     CEFTRIAXONE <=1 SENSITIVE Sensitive     CIPROFLOXACIN <=0.25 SENSITIVE Sensitive     GENTAMICIN <=1 SENSITIVE Sensitive     IMIPENEM 1 SENSITIVE Sensitive     TRIMETH/SULFA <=20 SENSITIVE Sensitive     PIP/TAZO <=4 SENSITIVE Sensitive     * >=100,000 COLONIES/mL ENTEROBACTER AEROGENES  MRSA PCR Screening     Status: None   Collection Time: 07/30/17 10:20 AM  Result Value Ref Range Status   MRSA by PCR NEGATIVE NEGATIVE Final    Comment:        The GeneXpert  MRSA Assay (FDA approved for NASAL specimens only), is one component of a comprehensive MRSA colonization surveillance program.  It is not intended to diagnose MRSA infection nor to guide or monitor treatment for MRSA infections.   Culture, blood (routine x 2)     Status: None (Preliminary result)   Collection Time: 08/01/17  3:01 PM  Result Value Ref Range Status   Specimen Description BLOOD LEFT HAND  Final   Special Requests IN PEDIATRIC BOTTLE Blood Culture adequate volume  Final   Culture NO GROWTH < 24 HOURS  Final   Report Status PENDING  Incomplete  Culture, blood (routine x 2)     Status: None (Preliminary result)   Collection Time: 08/01/17  3:01 PM  Result Value Ref Range Status   Specimen Description BLOOD RIGHT HAND  Final   Special Requests IN PEDIATRIC BOTTLE Blood Culture adequate volume  Final   Culture NO GROWTH < 24 HOURS  Final   Report Status PENDING  Incomplete     Time coordinating discharge: Over 30 minutes  SIGNED:   Debbe Odea, MD  [facility] 08/03/2017, 10:17 AM Pager   If 7PM-7AM, please contact night-coverage [email or web address] Password [email or web address]

## 2017-08-03 NOTE — Care Management Note (Signed)
Case Management Note  Patient Details  Name: [name] [name] MRN: [id number] Date of Birth: [birth year]  Subjective/Objective:         CM following for progression and d/c planning.            Action/Plan: Pt left the hospital prior to the CM discussing d/c needs. This CM contacted the pt at home and spoke with both daughters who state that they do not think that [facility] would be needed at this time. This CM advised the daughters to contact their PCP for [facility] orders if they will for services later. Pt has Palliative care services and [facility].   Expected Discharge Date:  08/03/17               Expected Discharge Plan:  [place]  In-House Referral:  NA  Discharge planning Services  CM Consult  Post Acute Care Choice:  NA Choice offered to:  Adult Children  DME Arranged:    DME Agency:     HH Arranged:  Patient Refused [place] Agency:  NA  Status of Service:  Completed, signed off  If discussed at [place] of Stay Meetings, dates discussed:    Additional Comments:  Adron Bene, RN 08/03/2017, 5:07 PM

## 2017-08-03 NOTE — Consult Note (Signed)
   [facility] CM Inpatient Consult   08/03/2017  Brionne Mertz Kestenbaum 10-16-1926 [id number]   Follow up: [facility] Medicare  Met with the patient who is resting quietly with daughters, Eustaquio Maize and [name],   at the bedside. They endorse Dr. Frederico Hamman Copland as her primary care provider.  They also states that the patient will discharge home with Hospice and [place].  They feel that the patient will receive all of her disposition needs with Hospice. Family did accept a brochure and contact information.  No needs identified as the patient will have full care management services with Hospice.  For questions, please contact:  Natividad Brood, RN BSN [facility] Liaison  [phone] business mobile phone Toll free office [phone]

## 2017-08-03 NOTE — Care Management Important Message (Signed)
Important Message  Patient Details  Name: [name] [name] MRN: [id number] Date of Birth: [birth year]   Medicare Important Message Given:  Yes    [name] [name] 08/03/2017, 10:05 AM

## 2017-08-04 ENCOUNTER — Other Ambulatory Visit: Payer: Self-pay | Admitting: Family Medicine

## 2017-08-06 LAB — CULTURE, BLOOD (ROUTINE X 2)
CULTURE: NO GROWTH
Culture: NO GROWTH
SPECIAL REQUESTS: ADEQUATE
Special Requests: ADEQUATE

## 2017-08-07 ENCOUNTER — Ambulatory Visit: Payer: Self-pay | Admitting: *Deleted

## 2017-08-07 ENCOUNTER — Other Ambulatory Visit: Payer: Self-pay | Admitting: *Deleted

## 2017-08-07 NOTE — Telephone Encounter (Signed)
   Reason for Disposition . [1] Systolic BP 02-585 AND [2] taking blood pressure medications AND [3] NOT dizzy, lightheaded or weak  Answer Assessment - Initial Assessment Questions 1. BLOOD PRESSURE: "What is the blood pressure?" "Did you take at least two measurements 5 minutes apart?"  1st reading at 1025 was 70/51 pulse 89  2. ONSET: "When did you take your blood pressure?"    1249- retake was 86/60 pulse 76 3. HOW: "How did you obtain the blood pressure?" (e.g., visiting nurse, automatic home BP monitor)     Automatic BP monitor 4. HISTORY: "Do you have a history of low blood pressure?" "What is your blood pressure normally?"     Yes, SBP usually runs in the 90s, recently admitted at the hospital with sepsis and low BP 5. MEDICATIONS: "Are you taking any medications for blood pressure?" If yes: "Have they been changed recently?"     Metoprolol has been changed from 12.5mg  twice a day to 25mg  twice a day 6. PULSE RATE: "Do you know what your pulse rate is?"      76 7. OTHER SYMPTOMS: "Have you been sick recently?" "Have you had a recent injury?"     Recently discharged from the hospital on Thurday 10/18  Protocols used: [place]  Pt's daughter states pt was recently discharged home from the hospital on Thursday. Pt's Metoprolol had recently been increased from 12.5mg  twice a day to 25mg  twice a day. BP check after taking 25mg  at 9am this morning was 70/51 with pulse being 89. Pt's daughter asked to recheck BP while triage nurse on the phone at 1249 and new reading was 86/60 and pulse-76. Pt's daughter denies that the pt has any complaints of dizziness or feeling lightheaded, just generalized weakness which was not a new symptom. Advised pt's daughter to check BP prior to giving evening dose of Metoprolol and that the office should reach back out to the pt's daughter with recommended changes to the pt's medication.

## 2017-08-07 NOTE — Telephone Encounter (Signed)
Drop the metoprolol back to 12.5 mg twice a day.  Please make sure she has hospital follow-up set up with me in the next week.

## 2017-08-07 NOTE — Patient Outreach (Addendum)
[place] [facility]) Care Management  08/07/2017  [place] 10-15-1926 [id number]   Transition of Care Referral  Referral Date:08/07/17 Referral Source: [employer] Date of Discharge: 08/03/17 Facility:[facility] Discharge Diagnosis: Sepsis Insurance: [facility]  Outreach attempt #1 to patient. Family member requested a return phone call at a later time. Patient was unavailable.     Plan: RN CM will contact patient within one week.   Lake Bells, RN, BSN, MHA/MSL, [place] Telephonic Care Manager Coordinator [facility] Direct Phone: [phone] Toll Free: [phone] Fax: [phone]

## 2017-08-07 NOTE — Telephone Encounter (Signed)
[name] (daughter) notified as instructed by telephone.  She will call back toward the end of the week to schedule a hospital follow up appointment with Dr. Lorelei Pont.

## 2017-08-08 ENCOUNTER — Telehealth: Payer: Self-pay | Admitting: Family Medicine

## 2017-08-08 ENCOUNTER — Other Ambulatory Visit: Payer: Self-pay | Admitting: *Deleted

## 2017-08-08 ENCOUNTER — Ambulatory Visit: Payer: Self-pay | Admitting: *Deleted

## 2017-08-08 NOTE — Patient Outreach (Signed)
[place] [facility]) Care Management  08/08/2017  [place] 10/04/1926 [id number]  Transition of Care Referral  Referral Date:08/07/17 Referral Source: [employer] Date of Discharge: 08/03/17 Facility[facility] Discharge Diagnosis: Sepsis Insurance: [facility] Medicare  Patient was recently discharged from the hospital for Sepsis/UTI. She had 3 hospital admissions this year, with 2 being related to Sepsis/UTI. Per [name], she wants to prevent the patient from being hospitalized a 4th time. Per [name], "it is difficult to determine when patient develops a UTI". [name] discussed how it was too late to prevent patient from being hospitalized by the time her illness was recognized. Dr. Arlyn Dunning office contacted patient to schedule an appointment, however she had to delay scheduling the appointment. [name] is unable to schedule a follow-up hospital discharge appointment due to patient being weak. Patient is active with [facility]. [name] stated, she reached out to [place] on the weekend the patient was admitted to the hospital. [name] reported, "She was told [place] don't make visits on Saturday and Sunday". Patient is not receiving any home health services. Patient's husband passed away about 5 weeks ago. [facility] services and benefits explained to [place]. [name] agreed to [facility] services.    Plan: RN CM will send referral to [facility] RN for further in home eval/assessment of care needs and management of chronic conditions. RN CM will send referral to [place] for transition of care. RN CM advised patient that [facility] community RN CM would follow up and contact patient within the next 10 days. RN CM advised patient to contact RNCM for any needs or concerns.   [place] Bells, RN, BSN, MHA/MSL, [place] Telephonic Care Manager Coordinator [facility] Direct Phone:  [phone] Toll Free: [phone] Fax: [phone]

## 2017-08-08 NOTE — Telephone Encounter (Signed)
I do not have the time to add home visits for other doctor's patients

## 2017-08-08 NOTE — Telephone Encounter (Signed)
Copied from [phone]. Topic: Quick Communication - See Telephone Encounter >> Aug 08, 2017  1:26 PM Ether Griffins B wrote: CRM for notification. See Telephone encounter for:  08/08/17. Earl Lagos from [place] is calling in regards to seeing about setting up a home visit for this pt. She was discharged from the hospital on 08/03/2017 and is to weak to come in for a follow up visit. Please contact [place] at [phone].

## 2017-08-08 NOTE — Telephone Encounter (Signed)
The call center skyped to see if any provider would do home visits;Please advise.

## 2017-08-09 ENCOUNTER — Telehealth: Payer: Self-pay | Admitting: Family Medicine

## 2017-08-09 NOTE — Telephone Encounter (Signed)
This is not possible - she is currently on hospice. Can we get them to assess her ASAP?

## 2017-08-09 NOTE — Telephone Encounter (Signed)
Juanita from [facility] notified by telephone that Dr. Lorelei Pont does not do Home Visit.  He ask that Hospice assess her APAP.  [place] states that Ms. Brucker is not under Hospice Care but is under [place].  She states [facility] is going to send out one of their nurses to see patient.

## 2017-08-09 NOTE — Telephone Encounter (Signed)
Very good 

## 2017-08-09 NOTE — Telephone Encounter (Signed)
Copied from [place] #[id number]. Topic: Inquiry >> Aug 09, 2017  8:25 AM Pricilla Handler wrote: Reason for CRM: Patient's daughter [name] has called in to request that Dr. Lorelei Pont send in an order for Patient's Eliquis. [place] has been trying to obtain the refill order. Patient's daughter requests that [place] be contacted ASAP!!! [facility] Phone # [phone] FAX # [phone].

## 2017-08-09 NOTE — Telephone Encounter (Signed)
Per 08/04/17 refill note eliquis was sent electronically to [facility] on 08/07/17. [name] [name] was notified and voiced understanding and will contact pharmacy.

## 2017-08-10 ENCOUNTER — Other Ambulatory Visit: Payer: Self-pay | Admitting: *Deleted

## 2017-08-10 ENCOUNTER — Ambulatory Visit: Payer: Self-pay | Admitting: *Deleted

## 2017-08-10 MED ORDER — FUROSEMIDE 20 MG PO TABS: ORAL_TABLET | ORAL | 2 refills | Status: DC

## 2017-08-10 NOTE — Patient Outreach (Signed)
Successful telephone outreach for [employer], 83 year old female as this RN CM covering for Reginia Naas (pt's primary RN CM)- follow up on referral received 08/08/17 from [place] for Community CM services/transition of care/recent hospitalization October 13-18,2018 for Sepsis, UTI.  Pt's history includes but not limited to COPD, Essential Hypertension, Atrial Fib, CHF, CVA.   Spoke with  daughter [name], HIPAA identifiers provided on pt, discussed purpose of call- follow up on referral, informed  covering for Richarda Osmond pt's primary RN CM.  Daughter  Reports pt still has issues with  swelling in feet/legs, edema going above the knees, started 08/07/17, not getting better, compression stockings on every day.  Daughter reports pt not sleeping well,no energy,second UTI/sepsis since July plus pt is dealing with recent death of spouse/married  67 years.   Daughter reports she is  checking pt's  BP daily, today was 87/63 which is better/on Monday was 70/51.  Daughter reports PCP was called and Metoprolol was decreased to 12.5 mg bid, pt taking all medications as ordered,** Patient was recently discharged from hospital and all medications have been reviewed.  Daughter reports pt  is receiving 24/7 care, she stays during the day/alternates with sister.  RN CM discussed with daughter calling PCP office and report pt's swelling/new since 10/22, not  Getting better to which she agreed to do.  RN CM discussed with daughter  [facility] services- to follow for transition of care (weekly calls, a home visit), no cost to pt, that [facility] RN CM (pt's primary  RN CM) will be following up next week telephonically.    RN CM provided daughter with direct phone number should needs arise before primary RN CM's return as well as [facility]'s main office number and 24 hour nurse line.    Plan:  RN CM to provide update to pt's primary [facility] RN CM of successful outreach call.            Plan to inform Dr. Edilia Bo of [facility]  involvement- send by in [place] letter.   Zara Chess.   [place] Care Management  [phone]

## 2017-08-10 NOTE — Telephone Encounter (Signed)
Valli Glance RN also sent this note; Patient's daughter has called with concerns about her mother- does not want to take her to the hospital. (Routing comment)     Romana Juniper, RN 31 minutes ago (4:04 PM)

## 2017-08-10 NOTE — Telephone Encounter (Signed)
Lasix comes in 20 mg, 40 mg & 80 mg.  Please advise.

## 2017-08-10 NOTE — Telephone Encounter (Signed)
Lasix 20 mg, 1 po daily prn swelling. #30, 2 ref  Take for 2 days in a row and then hold for prn use only with swelling.

## 2017-08-10 NOTE — Telephone Encounter (Signed)
Start lasix 10 mg, 1 po daily prn for swelling, #30, 2 refills  Take for 3 days in a row, then hold and only use with swelling.

## 2017-08-10 NOTE — Telephone Encounter (Signed)
Lasix 20 mg Rx sent into [facility].  [name] notified by telephone.

## 2017-08-10 NOTE — Telephone Encounter (Signed)
Patient is too weak to use her walker- she is getting help to commode. She is having compression stocking on in the morning and taking them off in the evening.  Reason for Disposition . Patient sounds very sick or weak to the triager  Answer Assessment - Initial Assessment Questions 1. ONSET: "When did the swelling start?" (e.g., minutes, hours, days)     5-6 days ago after coming home from hospital- gotten worse over time 2. LOCATION: "What part of the leg is swollen?"  "Are both legs swollen or just one leg?"     Bilateral- coming above her knee 3. SEVERITY: "How bad is the swelling?" (e.g., localized; mild, moderate, severe)  - Localized - small area of swelling localized to one leg  - MILD pedal edema - swelling limited to foot and ankle, pitting edema < 1/4 inch (6 mm) deep, rest and elevation eliminate most or all swelling  - MODERATE edema - swelling of lower leg to knee, pitting edema > 1/4 inch (6 mm) deep, rest and elevation only partially reduce swelling  - SEVERE edema - swelling extends above knee, facial or hand swelling present      Severe-tight but not painful 4. REDNESS: "Does the swelling look red or infected?"     Warm to touch- normal coloration 5. PAIN: "Is the swelling painful to touch?" If so, ask: "How painful is it?"   (Scale 1-10; mild, moderate or severe)     Not painful 6. FEVER: "Do you have a fever?" If so, ask: "What is it, how was it measured, and when did it start?"      no 7. CAUSE: "What do you think is causing the leg swelling?"     Compression stocking- ? BP medication- no diarectic 8. MEDICAL HISTORY: "Do you have a history of heart failure, kidney disease, liver failure, or cancer?"     COPD 9. RECURRENT SYMPTOM: "Have you had leg swelling before?" If so, ask: "When was the last time?" "What happened that time?"     History of slight swelling- but never this bad 10. OTHER SYMPTOMS: "Do you have any other symptoms?" (e.g., chest pain, difficulty  breathing)       No-  Low BP reading 86/63, not sleeping well, weak 11. PREGNANCY: "Is there any chance you are pregnant?" "When was your last menstrual period?"       n/a  Protocols used: LEG SWELLING AND EDEMA-A-AH

## 2017-08-11 ENCOUNTER — Encounter: Payer: Self-pay | Admitting: *Deleted

## 2017-08-16 ENCOUNTER — Encounter: Payer: Self-pay | Admitting: *Deleted

## 2017-08-16 ENCOUNTER — Other Ambulatory Visit: Payer: Self-pay | Admitting: *Deleted

## 2017-08-16 NOTE — Patient Outreach (Signed)
[place] [facility]) Care Management [place] Telephone Outreach, Transition of Care day 8  08/16/2017  [place] 06/14/1926 [id number]   Successful telephone outreach to Dorian Pod, daughter, [place], and primary caregiver for [name] [name], 83 y/o female originally referred to [place] by insurance provider for transition of care after recent hospitalization October 13-18, 2018 for recurrent UTI/ sepsis with encephalopathy.  Patient was discharged home to self-care without home health [facility]) services.  Patient has history including, but not limited to, severe AS, Atrial Fibrillation, sCHF, COPD, HTN, recurrent UTI, previous thoracic aortic aneurysm, previous CVA, and protein calorie malnutrition.  HIPAA/ identity confirmed with patient's caregiver during phone call; patient's daughter reports that patient is sitting within earshot, and that phone is on speaker mode; patient provides verbal permission for communication with daughter [name] [name].  Today, [name] [name] reports that "things are finally getting better," after patient's recent hospitalization, and she denies that patient is in acute distress or pain.   [place] services again discussed with patient/ caregiver, and verbal consent to [facility] CM services was verified.  [name] [name] states that patient "finally seems to feel better after such a difficult year," stating that patient had a stroke in January 2018, a hospitalization for UTI in July 2018, and then recently lost her husband, who passed away in 2017-07-20.  Emotional support/ encouragement was provided throughout today's 45 minute phone call.  Caregiver/ daughter [name] [name] further reports:  Medications: -- Has all medicationsand takes as prescribed;denies questions about current medications; acknowledges that patient's PCP recently added diuretic (Lasix) to patient's medication regimen, and confirms accurate report of dosing guidelines for diuretic.  Reports that patient  took lasix QD on Friday 10/26- Tuesday 08/15/17, and that patient "lost about 6 pounds" with diuretic therapy.  Reports that she has not given lasix today, as patient's bilateral LE swelling "is much better," and patient "is breathing just fine." Discussed indications for administering lasix, as prescribed by patient's PCP on Friday 10/26, and [name] [name] verbalizes accurate understanding of same. -- caregiver manages/ provides patient's daily medications  -- denies issues with swallowing medications  Provider appointments: -- no upcoming provider appointments noted; daughter/ caregiver [name] [name] reports that she intends to make PCP appointment soon; waiting to verify schedules/ availablility of family members.  Encouraged [name] [name] to make this appointment promptly, and she acknowledges importance of having hospital follow up PCP appointment soon, and states she will make this appointment soon.   Safety/ Mobility/ Falls: -- denies new/ recent falls -- assistive devices: uses rolling walker "all the time," and has assistance with ambulation from family members, as patient's osteoarthritis in her back makes her "lean over her walker," which increases her fall risk -- general fall risks/ prevention education discussed with patient/ caregiver today  [place] needs: -- currently denies community resource needs, stating "very large" and supportive family members that assist with care needs as indicated; [name] [name] reports that between all family members (patient's children/ grandchildren), patient has someone with her "24-7," and "is never alone." -- family provides transportation for patient to all provider appointments, errands, etc  Advanced Directive (AD) Planning:   --reports has exisisting AD's in place for HCPOA [name] [name]), Living Will, and "yellow DNR form posted in home" and patient verbally declines desire to make changes in same today   -- encouraged [name] [name] to consider taking AD's with  patient to next PCP appointment for uploading in EMR, explaining value of having these documents readily available when needed, and [name] [name] agrees  and states that she will try to remember to do this.  Self-health management of recurrent UTI, sepsis, CHF, and COPD: -- reports patient "staying hydrated" as "best as she can," stating that patient typically has "never" drank a whole lot of fluids; explained/ discussed need for patient to stay as hydrated as possible for UTI prevention, especially considering addition of prn lasix to patient's medication regimen, and [name] [name] verbalizes understanding and agreement. -- discussed need for caregiver/ patient to promptly report any new concerns/ issues/ problems to patient's care providers promptly, and again encouraged caregiver to schedule hospital follow up provider appointment with PCP promptly. -- discussed general signs/ symptoms of recurrent UTI with caregiver while patient listening with phone on speaker mode -- caregiver endorses that with family assistance, daily weight monitoring and recording is done; reports weight "yesterday" of "101 lbs" which is improved from Friday 08/11/17 weight of "107 lbs."  Reports that weight has gradually "returned to patient's normal" over 4 days with administration of lasix as prescribed 08/11/17; discussed risk for patient becoming dehydrated with lasix, and again encouraged patient to maintain hydration while balancing risk of fluid overload in setting of CHF.  Discussed weight gain guidelines to notify patient's care providers for weight gain > 3 lbs overnight/ 5 lbs in one week, and to use prn lasix as prescribed.  [name] [name] verbalizes a very good understanding of importance of daily weight monitoring and balancing fluid intake to avoid dehydration and fluid overload. -- denies that patient is having difficulty breathing "since we started lasix on Friday 08/11/17;" endorses that patient wears compression stockings bilaterally  "every day during the daytime hours." -- states that family monitors/ records patient's BP "every day," and "it tends to run low."  Reports BP "yesterday" of 81/43" stating "that's about what it runs all the time."  Discussed with caregiver that lasix can further decrease BP, and again encouraged gentle maintenance hydration with continued blood pressure monitoring daily, which she verbalizes understanding and agreement with. -- [name] [name] reports today that patient was previously active with [facility], and states that she is no longer sure whether or not they are actively involved in patient's care.  States that patient was visited at home by the Palliative Care team "back in August 2018," but "we haven't heard from them since then."  [name] [name] reports that she attempted to contact the Palliative care team on July 29, 2017, prior to patient going into the hospital, and was told that the palliative care team did not make home visits over the weekend, and that patient should be taken to the ED.  Reports that she has not heard from the palliative care team since patient's discharge home on August 03, 2017.  Discussed with [name] [name] that I would attempt to place care coordination/ collaboration call to [place] for clarification of their involvement in patient's care, and she verbalized appreciation.  Patient/ caregiver deny further issues, concerns, or problems today.  I provided/ confirmed that patient has my direct phone number, the main [facility] CM office phone number, and the [facility] CM 24-hour nurse advice phone number should issues arise prior to next scheduled [place] outreach, and we scheduled [facility] CM initial home visit for next week, around caregiver's scheduling preferences.  Plan:  Patient will take all medications as prescribed and will attend all scheduled provider office visits post-hospital discharge  Patient will continue monitoring and recording  daily weights and BP's  Patient/ caregiver will promptly notify providers of  any new concerns, issues, or problems that arise  I will reach out to Palliative Care team of [facility] for care coordination to clarify their current involvement in patient's care  Patient's caregiver will promptly schedule hospital follow up office visit with patient's PCP  [place] outreach for ongoing transition of care to continue with initial home visit scheduled for next week.  Oneta Rack, RN, BSN, [employer] [facility] Care Management  [phone]

## 2017-08-18 ENCOUNTER — Telehealth: Payer: Self-pay | Admitting: Family Medicine

## 2017-08-18 NOTE — Telephone Encounter (Signed)
Copied from [place] #[id number]. >> Aug 18, 2017 11:25 AM Neva Seat wrote: Pt is having a hospital F/U for UTI  Daughter Dorian Pod wants to know if pt can bring a urine specimen to visit (Nov. 19) since pt may not be able to urinate at visit.

## 2017-08-18 NOTE — Telephone Encounter (Signed)
[name] notified by telephone that it would be fine if they brought in a urine sample from home the day of Ms. Falzone's appointment.  Advised it would be best if collected right before they come in but if it is collected 2 hours or more prior to appointment , the sample will need to be refrigerated.  [name] states understanding.

## 2017-08-23 ENCOUNTER — Other Ambulatory Visit: Payer: Self-pay | Admitting: *Deleted

## 2017-08-23 NOTE — Patient Outreach (Signed)
[place] [facility]) Care Management [place] Telephone [facility] Coordination  08/23/2017  [name] [birth year] [id number]  Successful telephone outreach to "[name] [name]" with [facility] [place] [phone]) re: [name] [name], 83 y/o female originally referred to [facility] CM by insurance provider for transition of care after recent hospitalization October 13-18, 2018 for recurrent UTI/ sepsis with encephalopathy.  Patient was discharged home to self-care without home health [facility]) services.  Patient has history including, but not limited to, severe AS, Atrial Fibrillation, sCHF, COPD, HTN, recurrent UTI, previous thoracic aortic aneurysm, previous CVA, and protein calorie malnutrition.   During time of [facility] RN CCM initial telephone outreach, patient's daughter/ primary caregiver [name] [name] reported that patient had previously been active with [facility] [place], however, she stated at that time that she was not sure whether or not patient was still active with this program; reported that Palliative Care nurse practitioner visited patient "back in August," but that they had not had any additional follow up since then.  Daughter reported that the family contacted Palliative Care by phone prior to taking patient to hospital on July 29, 2017, and since her discharge home they have not heard from Palliative care team.  Today, [name] [name] confirms that patient is currently active with Palliative care program, and that nurse "[name] [name]" contacted patient's family on 07/31/17 and spoke with patient's daughter [name] [name], who informed that patient was currently hospitalized at that time.  [name] [name] shared that she would notify Palliative Care nurse practitioner (NP) to inform of our phone communication today, so that NP can follow up with patient's family.   Plan:  Will update patient's family on outcome of care coordination phone call today  Will collaborate with Palliative Care team of  [facility] as indicated around patient care needs  Oneta Rack, RN, BSN, [place] Coordinator [facility] Care Management  [phone]

## 2017-08-24 ENCOUNTER — Other Ambulatory Visit: Payer: Self-pay | Admitting: *Deleted

## 2017-08-24 ENCOUNTER — Encounter: Payer: Self-pay | Admitting: *Deleted

## 2017-08-24 NOTE — Patient Outreach (Signed)
[facility]) Care Management  [place] Initial Home Visit, Transition of Care day 15  08/24/2017  [place] 1926/02/14 [id number]  Stephania Macfarlane Abruzzese is an 83 y.o. female originally referred to [facility] by insurance provider for transition of care after recent hospitalization October 13-18, 2018 for recurrent UTI/ sepsis with encephalopathy.  Patient was discharged home to self-care without home health [facility]) services.  Patient has history including, but not limited to, severe AS, Atrial Fibrillation, sCHF, COPD, HTN, recurrent UTI, previous thoracic aortic aneurysm, previous CVA, and protein calorie malnutrition.  HIPAA/ identity confirmed with patient in person during home visit today, and patient's daughter/ primary caregiver [name] is also present and actively participates in home visit today.  [place] services were reviewed with patient/ caregiver today, and [facility] CM written consent was obtained.  Pleasant 90 minute home visit.  Subjective: "I'm doing the best I can to get better and stay well, but I know I couldn't do it without my wonderful daughter and family; they have kept me well and take such great care of me."  Assessment:  Beila appears to be recuperating well from her recent hospitalization, although she admits challenges around staying well hydrated.  Elleanna has a supportive and actively involved family network, and she denies community resource needs.  Shaquina has palliative care goals for her overall health, and is currently active with Palliative Care Team of [facility].   Japneet and her family could benefit from ongoing reinforcement of self-health management of chronic disease state of CHF and Atrial Fibrillation with frequent episodes of recurrent UTI requiring hospitalization.  Today, patient reports that she believes she "is finally getting better" after her recent hospitalizations.  Patient denies pain/ concerns today, and she is in no  apparent/ obvious distress throughout home visit.  Patient further reports with daughter [name] participating in conversation:  Medications: -- Has all medicationsand takes as prescribed;denies questions about current medications; reports has not needed recently added diuretic (Lasix), as patient's "leg swelling has improved and stabilized."  Daughter confirms accurate report of dosing guidelines for diuretic, and states that PCP advised to use only when patient's legs become swollen; discussed value of discussing use of diuretic around weight gain guidelines for CHF with PCP; daughter/ patient agree to discuss with PCP during upcoming scheduled office visit. -- caregiver manages/ provides patient's daily medications  -- denies issues with swallowing medications, although patient reports she "sometimes" has a difficult time with "getting the big pills" down; discussed technique of slightly lowering chin to chest to help; patient and daughter report patient will attempt this with these medications -- Patient was recently discharged from hospital and all medications were thoroughly reviewed with patient/ caregiver today.  Provider appointments: -- confirmed that daughter/ caregiver [name] scheduled PCP appointment for September 04, 2017; family to provide transportation to scheduled PCP office visit -- reviewed all upcoming provider appointments with patient/ caregiver, and encouraged caregiver to promptly notify PCP for any concerns, issues, or problems that arise between office visits; both verbalize understanding/ agreement  Safety/ Mobility/ Falls: -- denies new/ recent falls -- assistive devices: uses rolling walker "all the time," and has assistance with ambulation from family members with all ambulation around home, as patient's osteoarthritis in her back makes her "lean over her walker."  -- No obvious fall risks/ hazards noted during today's [facility] CM initial home visit; patient remained  sitting during entirety of home visit -- general fall risks/ prevention education discussed with patient/  caregiver today  Holiday representative needs: -- patient/ caregiver continues to deny community resource needs, stating "very large" and supportive family members that assist with care needs as indicated; [name] reports that between all family members (patient's children/ grandchildren), patient has someone with her "24-7," and "is never alone." -- family provides transportation for patient to all provider appointments, errands, etc  Advanced Directive (AD) Planning:   --reports has exisisting AD's in place for HCPOA [name]), Living Will, and "yellow DNR form posted in home" and patient verbally declines desire to make changes in same today    Self-health management of recurrent UTI, sepsis, CHF, and atrial fibrillation in setting of palliative care goals: -- patient reports "trying to stay hydrated" as "best as she can," stating that she 'has always had" a difficult time maintaining her hydration; dicussed techniques patient could employ to increase hydration in small amounts throughout day; patient states she will continue working to "drink more water."   Discussed need to balance daily fluid management around staying hydrated without becoming fluid overloaded -- reports that since patient's bilateral LE swelling "improved after she came home from hospital and being prescribed lasix," that caregiver did not continue monitoring/ recording daily weights; stated that she has been using "leg swelling" as her guide to patient's overall fluid status.  Encouraged caregiver to resume daily weight monitoring/ recording as primary guide for fluid status, explaining that weight gain is an early sign of fluid overload, while leg swelling is a later sign; caregiver agrees to resume daily weight monitoring and recording in [facility] CM patient documentation tool, provided to patient today.  Reports last  recorded weight "last week" of "101 pounds." -- provided and reviewed EMMI educational material regarding rationale/ guidelines for daily weights in setting of CHF, along with concerning signs/ symptoms of CHF and UTI; Discussed weight gain guidelines to notify patient's care providers for weight gain > 3 lbs overnight/ 5 lbs in one week -- provided/ reviewed education around CHF zones with corresponding action plans for each zone -- patient continues wearing compression stockings bilaterally "every day during the daytime hours." -- family monitors/ records patient's BP "every day," and "it tends to run low."  Reports BP this morning of "95/62" stating that patient's baseline BP's are between "mid-high 80's/ 50-60's."  Reiterated with caregiver that lasix can further decrease BP, and again encouraged patient to improve gentle maintenance hydration and to continue blood pressure monitoring daily. -- provided and reviewed education around A-fib action plan with patient and caregiver today; discussed how A-Fib can impact overall state of CHF; discussed signs' symptoms of A-Fib -- [name] previously reported that patient was previously active with [facility].  Updated caregiver that I had reached out to [facility] and confirmed that patient remains active; shared with caregiver that the NP from Palliative care team should be contacting her soon to clarify services/ answer questions about ongoing team involvement in patient's care.   Patient/ caregiver deny further issues, concerns, or problems today. I confirmed that patient/ caregiver havemy direct phone number, the main [facility] CM office phone number, and the [facility] CM 24-hour nurse advice phone number should issues arise prior to next scheduled [place] outreach for ongoing transition of care by phone next week.  Objective:    BP (!) 88/62   Pulse 80   Resp 18   Ht 1.626 m (5\' 4" )   Wt 101 lb (45.8  kg) Comment: "Last Tuesday"  SpO2 95%  BMI 17.34 kg/m   Review of Systems  Constitutional: Negative.        Frail small framed elderly female in no distress  Respiratory: Negative.  Negative for cough, shortness of breath and wheezing.   Cardiovascular: Negative.  Negative for chest pain, palpitations and leg swelling.  Gastrointestinal: Negative.  Negative for abdominal pain and nausea.  Genitourinary: Negative.   Musculoskeletal: Negative.  Negative for falls and myalgias.  Neurological: Negative.  Negative for dizziness.  Psychiatric/Behavioral: Negative.  Negative for depression. The patient is not nervous/anxious.    Physical Exam  Constitutional: She is oriented to person, place, and time. She appears well-developed and well-nourished. No distress.  Frail appearing  Cardiovascular: Normal rate, regular rhythm and intact distal pulses.  Murmur heard. Respiratory: Effort normal and breath sounds normal. No respiratory distress. She has no wheezes. She has no rales.  GI: Soft. Bowel sounds are normal.  Musculoskeletal: She exhibits no edema.  Wearing bilateral compression stockings  Neurological: She is alert and oriented to person, place, and time.  Skin: Skin is warm and dry. No erythema.  Psychiatric: She has a normal mood and affect. Her behavior is normal. Judgment and thought content normal.   Encounter Medications:   Outpatient Encounter Medications as of 08/24/2017  Medication Sig Note  . Aspirin-Caffeine (ANACIN) 400-32 MG TABS Take 2 tablets by mouth daily as needed (eadache).  08/10/2017: Per daughter pt takes 2 tablets once a day   . atorvastatin (LIPITOR) 10 MG tablet Take 1 tablet (10 mg total) by mouth daily.   . cyanocobalamin (,VITAMIN B-12,) 1000 MCG/ML injection INJECT 1 ML INTERMUSCLUARLLY ONCE A MONTH. (Patient taking differently: Inject 1,000 mcg into the muscle every 30 (thirty) days. ) 07/29/2017: 23th of every month  . ELIQUIS 2.5 MG TABS tablet TAKE 1  TABLET TWICE DAILY   . furosemide (LASIX) 20 MG tablet Take one tablet by mouth x 2 day, then take daily prn for swelling 08/24/2017: Last dose on August 15, 2017  . levothyroxine (SYNTHROID, LEVOTHROID) 25 MCG tablet Take 1 tablet (25 mcg total) by mouth daily before breakfast.   . magnesium oxide (MAG-OX) 400 (241.3 Mg) MG tablet Take 1 tablet (400 mg total) by mouth daily.   . metoprolol tartrate (LOPRESSOR) 25 MG tablet Take 1 tablet (25 mg total) by mouth 2 (two) times daily. 08/24/2017: Currently taking 12.5 mg po BID     . Multiple Vitamin (MULTIVITAMIN WITH MINERALS) TABS tablet Take 1 tablet by mouth daily.   . Multiple Vitamins-Minerals (PRESERVISION AREDS 2) CAPS Take 1 capsule by mouth 2 (two) times daily.   Marland Kitchen nystatin cream (MYCOSTATIN) APPLY 1 APPLICATION TOPICALLY 2 (TWO) TIMES DAILY (Patient taking differently: Apply 1 application topically 2 (two) times daily as needed. ) 08/10/2017: As needed.   Marland Kitchen acetaminophen (TYLENOL) 325 MG tablet Take 2 tablets (650 mg total) by mouth every 6 (six) hours as needed for mild pain (or Fever >/= 101). (Patient not taking: Reported on 08/10/2017)   . levofloxacin (LEVAQUIN) 500 MG tablet Take 1 tablet (500 mg total) by mouth daily. (Patient not taking: Reported on 08/24/2017) 08/24/2017: Took as prescribed and has completed therapy  . Nutritional Supplements (ENSURE ORIGINAL) LIQD Take 1 Bottle by mouth 2 (two) times daily between meals. (Patient not taking: Reported on 08/10/2017)    No facility-administered encounter medications on file as of 08/24/2017.     Functional Status:   In your present state of health, do you have any difficulty performing the  following activities: 08/16/2017 07/30/2017  Hearing? Y N  Comment per report of daughter/ Hillary Bow, with patient while phone on speaker mode -  Vision? Tempie Donning  Comment per report of daughter/ HCPOA, [name], with patient while phone on speaker mode, this is due to ongoing issues around dx of  macular degeneration -  Difficulty concentrating or making decisions? N N  Comment per report of daughter/ Hillary Bow, with patient while phone on speaker mode -  Walking or climbing stairs? Tempie Donning  Comment per report of daughter/ Hillary Bow, with patient while phone on speaker mode -  Dressing or bathing? Y N  Comment per report of daughter/ Hillary Bow, with patient while phone on speaker mode -  Doing errands, shopping? Y N  Comment per report of daughter/ Hillary Bow, with patient while phone on speaker mode -  Preparing Food and eating ? Y -  Comment per report of daughter/ Hillary Bow, with patient while phone on speaker mode, family assists with meals -  Using the Toilet? N -  Comment per report of daughter/ Hillary Bow, with patient while phone on speaker mode -  In the past six months, have you accidently leaked urine? N -  Comment per report of daughter/ Hillary Bow, with patient while phone on speaker mode -  Do you have problems with loss of bowel control? N -  Comment per report of daughter/ Hillary Bow, with patient while phone on speaker mode -  Managing your Medications? Y -  Comment per report of daughter/ Hillary Bow, with patient while phone on speaker mode, daughter assists -  Managing your Finances? Y -  Comment per report of daughter/ Hillary Bow, with patient while phone on speaker mode, daughter assists -  Housekeeping or managing your Housekeeping? Y -  Comment per report of daughter/ Hillary Bow, with patient while phone on speaker mode, family assists -  Some recent data might be hidden    Fall/Depression Screening:    Fall Risk  08/24/2017 08/16/2017 08/08/2017  Falls in the past year? No No No  Comment - per report of daughter/ Hillary Bow, with patient while phone on speaker mode -  Risk for fall due to : Impaired balance/gait;Impaired mobility;Impaired vision;Medication side effect Impaired balance/gait;Impaired mobility;Impaired  vision Impaired mobility;Impaired balance/gait  Risk for fall due to: Comment - General fall risk education discussed with daughter/ Hillary Bow, with patient while phone on speaker mode -   [facility] 2/9 Scores 08/16/2017 08/08/2017 11/02/2016 07/13/2016 07/21/2014  PHQ - 2 Score 1 - 0 0 0  Not completed - Pamala Hurry answered screening questions.  - - -   Plan:  Patient will take all medications as prescribed and will attend all scheduled provider office visits post-hospital discharge  Patient will resume monitoring and recording daily weights and will continue monitoring and recording daily BP's  Patient/ caregiver will promptly notify providers of any new concerns, issues, or problems that arise  Patient/ caregiver will review educational material provided to them today  Patient will continue trying techniques to improve/ gradually increase her daily hydration  [place] outreach for ongoing transition of care to continue with scheduled telephone outreach next week.  [facility] CM Care Plan Problem One     Most Recent Value  Care Plan Problem One  Risk for hospital readmission related to multiple recent hospitalizations for UTI/Sepsis   Role Documenting the Problem One  Care Management [place] for Problem One  Active  [facility] Long Term Goal   Over the next 31 days, patient will not experience hospital readmission, as evidenced by patient/ caregiver reporting and review of EMR during [facility] RN CM outreach  [place] Term Goal Start Date  08/10/17  Interventions for Problem One Long Term Goal  Using teachback method, discussed patient's current clinical status with patient/ caregiver,  reviewed medications/ performed in-home medication reconciliation,  confirmed that caregiver has scheduled hospital discharge PCP appointment,  [facility] RN CCM initial home visit completed   [facility] CM Short Term Goal #1   Over the next 30 days, patient will take medications as prescribed, as evidenced by  patient/ caregiver reporting during [place] outreach  [facility] CM Short Term Goal #1 Start Date  08/10/17  Interventions for Short Term Goal #1  Using teachback method, performed in-home medication reconciliation and discussed all medications with patient and caregiver,  again discussed recently added diuretic therapy and confirmed patient's recent need to take,  reviewed potential side effects of diuretic therapy with patient/ caregiver, and encouraged caregiver to discuss use of prn Lasix with PCP during scheduled office visit.  Discussed potential value of using prn diuretic around weight gain guidelines for CHF, rather than development of symptoms  [facility] CM Short Term Goal #2   Over the next 30 days, patient will not experience recurrent UTI, as evidenced by patient/ caregiver reporting and review of EMR during [place] outreach  [employer] [facility] CM Short Term Goal #2 Start Date  08/10/17  Interventions for Short Term Goal #2  Using teachback method, provided and reviewed with patient/ caregiver EMMI educational material re: signs/ symptoms UTI, importance of maintaining hydration, importance of contacting PCP promptly for any new concerns, issues, or problems that arise.  Discussed use of newly added diuretic therapy and need to maintain hydration while also monitoring for fluid overload in concurrent setting of CHF     [facility] CM Care Plan Problem Two     Most Recent Value  Care Plan Problem Two  Knowledge deficit related to self-health managment of chronic disease state of CHF in setting of elderly patient with palliative care goals, as evidenced by patient/ caregiver reporting of same  Role Documenting the Problem Two  Care Management [place] for Problem Two  Active  Interventions for Problem Two Long Term Goal   Using teachback method, provided and thoroughly reviewed EMMI educational material on rationale for performing daily weights,  reviewed weight gain guidelines/ signs and symptoms of fluid  overload/ value of following low salt diet with patient/ caregiver    [facility] Long Term Goal  Over the next 54 days, patient and caregiver will be able to verbalize 3 strategies for self-health management of chronic disease state of CHF in setting of palliative care goals, as evidenced by patient and caregiver reporting during [facility] RN CCM outreach  [facility] Long Term Goal Start Date  08/24/17     I appreciate the opportunity to participate in [place]' care,  Oneta Rack, RN, BSN, [employer] Coordinator [facility] Care Management  [phone]

## 2017-08-31 ENCOUNTER — Encounter: Payer: Self-pay | Admitting: *Deleted

## 2017-08-31 ENCOUNTER — Other Ambulatory Visit: Payer: Self-pay | Admitting: *Deleted

## 2017-08-31 NOTE — Patient Outreach (Signed)
[facility]) Care Management [place] Telephone Outreach, Transition of Care day 22  08/31/2017  Kings Grant [birth year] [phone]  Successful telephone outreach to [name] [name], daughter/ caregiver, on [facility] CM written consent, of [name] [name], 83 y.o. female originally referred to [place] by insurance provider for transition of care after recent hospitalization October 13-18, 2018 for recurrent UTI/ sepsis with encephalopathy. Patient was discharged home to self-carewithout home health [facility]) services.Patient has history including, but not limited to, severe AS, Atrial Fibrillation, sCHF, COPD, HTN, recurrent UTI, previous thoracic aortic aneurysm,previous CVA,and protein calorie malnutrition.HIPAA/ identity confirmed with [name] [name] during phone call today.  Today, [name] [name] reports that patient "is doing pretty good," and she denies that patient is in pain/ distress, and she denies new patient falls today.  [name] [name] further reports:  Medications: -- Has all medicationsand takes as prescribed;denies questions about current medications; reports patient has needed recently added prn diuretic (Lasix), as patient's "legs up by her thigh had some swelling earlier in the week;"  [name] [name] reports that she provided lasix to patient on "Tuesday and Wednesday" this week, and that patient's leg swelling is now "much better."  Provider appointments: --confirmed that daughter/ caregiver [name] [name] will transport patient to PCP appointment scheduled on Monday, September 04, 2017;  -- reiterated with caregiver to promptly notify PCP for any concerns, issues, or problems that arise between provider office visits and she verbalizes understanding/ agreement  Safety/ Mobility/ Falls: -- denies new/ recent falls --confirms that patient continues using assistive devices/ rolling walker "all the time," and has assistance with ambulation from family members with all ambulation -- general fall  risks/ prevention education reiterated with patient's caregivertoday  Advanced Directive (AD) Planning:  --reminded caregiver to take copies of patient's Living Will, HCPOA, and DNR form posted in her home to scheduled PCP office visit for uploading  Self-health management ofrecurrent UTI, sepsis, CHF, and atrial fibrillation in setting of palliative care goals:  --patient's caregiver reports patient "trying to stay hydrated" by "taking in a few extra small cups of water" each day; [name] [name] reports she has provided patient with about 4 ounces "extra" water in between meals, and that patient has tolerated this small increase without difficulty.  Denies patient signs/ symptoms UTI; again discussed need to balance daily fluid management around staying hydrated without becoming fluid overloaded in setting of concurrent CHF  -- reports patient has continues monitoring and recording daily weights; reports "weight gain with swelling earlier this week," and reports highest weight os 106 lbs on "Tuesday" morning; reports that she provided patient's prn lasix to her on Tuesday and Wednesday of this week, and that patient's weight decreased to "102.8 lbs this morning," and confirmed that patient's legs do not appear as swollen; positive reinforcement provided for caregiver monitoring and recording daily weights, and for monitoring patient's swelling.  [name] [name] denies that patient did not experience dyspnea/ shortness of breath outside of her baseline during this period of increased swelling with weight gain. Daughter verbalizes accurate report of dosing guidelines for diuretic, and we again discussed value of discussing use of diuretic around weight gain guidelines for CHF with PCP; daughter/ patient again agrees to discuss with PCP during upcoming scheduled office visit.  -- Reiterated weight gain guidelines to notify patient's care providers for weight gain >3 lbs overnight/ 5 lbs in one week  -- provided/  reviewed education around CHF zones with corresponding action plans for each zone; [name] [name] reports patient "in green zone" today.  -- patient  continues wearing compression stockings bilaterally"every day during the daytime hours."  -- family continues monitoring/ recording patient's BP "every day," and confirms "no changes" around patient's baseline "lower readings;" denies patient dizziness despite ongoing lower BP readings; encouraged caregiver to share all recorded BP readings with patient's PCP during upcomming PCP appointment, and she agrees to do so  -- caregiver denies that patient has experienced signs' symptoms of A-Fib  -- caregiver reports that family heard back from [place] [facility] NP.  Caregiver stated that NP will be visiting patient soon, but this visit has not yet been scheduled  Caregiverdeniesfurther issues, concerns, or problems today. Iconfirmed that patient/ caregiver havemy direct phone number, the main [facility] CM office phone number, and the [facility] CM 24-hour nurse advice phone number should issues arise prior to next scheduled [place] outreach for ongoing transition of care by phone next week.  Plan:  Patient will take all medications as prescribed and will attend all scheduled provider office visits post-hospital discharge  Patient will continue monitoring and recording daily weights and will continue monitoring and recording daily BP's  Patient/ caregiver will promptly notify providers of any new concerns, issues, or problems that arise  Patient/ caregiver will review educational material provided to them during [place] initial home visit as needed  Patient will continue trying techniques to conservatively improve/ gradually increase her daily hydration  [place] outreach for ongoing transition of care to continue with scheduled telephone outreach next week.  [facility] CM Care Plan Problem One     Most Recent Value  Care Plan Problem  One  Risk for hospital readmission related to multiple recent hospitalizations for UTI/Sepsis   Role Documenting the Problem One  Care Management [place] for Problem One  Active  [facility] Long Term Goal   Over the next 31 days, patient will not experience hospital readmission, as evidenced by patient/ caregiver reporting and review of EMR during [facility] RN CM outreach  [place] Term Goal Start Date  08/10/17  Interventions for Problem One Long Term Goal  Discussed with patient's caregiver/ daughter patient's current clinical status,  reviewed recent patient weights and use of medications,  confirmed that patient will attend scheduled PCP office visit next week  [facility] CM Short Term Goal #1   Over the next 30 days, patient will take medications as prescribed, as evidenced by patient/ caregiver reporting during [place] outreach  [facility] CM Short Term Goal #1 Start Date  08/10/17  Interventions for Short Term Goal #1  Reviewed with caregiver patient's recent need for prn diuretic therapy,  reiterated weight gain guidelines with caregiver for CHF self-health management, with strategies to avoid dehydration,  confirmed no other changes to patient's medications  [facility] CM Short Term Goal #2   Over the next 30 days, patient will not experience recurrent UTI, as evidenced by patient/ caregiver reporting and review of EMR during [facility] RN CCM outreach  [facility] CM Short Term Goal #2 Start Date  08/10/17  Interventions for Short Term Goal #2  reinforced with with patient's caregiver EMMI educational material previously provided re: signs/ symptoms UTI, importance of maintaining hydration, confirmed that patient has not experienced any signs/ symptoms UTI since [facility] CCM initial home visit last week, and that patient has conservatively increased fluid intake to avoid dehydration,  reiterated need to carefully manage dehydration vs. fluid overload in concurrent setting  of chronic disease state of CHF    [facility] CM Care Plan  Problem Two     Most Recent Value  Care Plan Problem Two  Knowledge deficit related to self-health managment of chronic disease state of CHF in setting of elderly patient with palliative care goals, as evidenced by patient/ caregiver reporting of same  Role Documenting the Problem Two  Care Management [place] for Problem Two  Active  Interventions for Problem Two Long Term Goal   Discussed with patient's caregiver current clinical status of patient,  confirmed that patient has continued monitoring and recording daily weights,  reviewed weights over last week with patient's caregiver,  confirmed that patient has demonstrated no breathing issues since last [facility] CCM home visit, and discussed patient's recent LE swelling with caregiver  [facility] Long Term Goal  Over the next 54 days, patient and caregiver will be able to verbalize 3 strategies for self-health management of chronic disease state of CHF in setting of palliative care goals, as evidenced by patient and caregiver reporting during [facility] RN CCM outreach  [place] Term Goal Start Date  08/24/17     Oneta Rack, RN, BSN, [place] Coordinator [facility] Care Management  [phone]

## 2017-09-04 ENCOUNTER — Ambulatory Visit: Payer: Medicare HMO | Admitting: Family Medicine

## 2017-09-04 ENCOUNTER — Encounter: Payer: Self-pay | Admitting: Family Medicine

## 2017-09-04 VITALS — BP 93/65 | HR 90 | Temp 98.3°F | Wt 105.5 lb

## 2017-09-04 DIAGNOSIS — F411 Generalized anxiety disorder: Secondary | ICD-10-CM | POA: Diagnosis not present

## 2017-09-04 DIAGNOSIS — F5101 Primary insomnia: Secondary | ICD-10-CM

## 2017-09-04 DIAGNOSIS — I5022 Chronic systolic (congestive) heart failure: Secondary | ICD-10-CM

## 2017-09-04 DIAGNOSIS — Z79899 Other long term (current) drug therapy: Secondary | ICD-10-CM | POA: Diagnosis not present

## 2017-09-04 DIAGNOSIS — I4891 Unspecified atrial fibrillation: Secondary | ICD-10-CM | POA: Diagnosis not present

## 2017-09-04 DIAGNOSIS — N390 Urinary tract infection, site not specified: Secondary | ICD-10-CM

## 2017-09-04 DIAGNOSIS — I350 Nonrheumatic aortic (valve) stenosis: Secondary | ICD-10-CM | POA: Diagnosis not present

## 2017-09-04 DIAGNOSIS — E43 Unspecified severe protein-calorie malnutrition: Secondary | ICD-10-CM | POA: Diagnosis not present

## 2017-09-04 LAB — MAGNESIUM: Magnesium: 2 mg/dL (ref 1.5–2.5)

## 2017-09-04 LAB — BASIC METABOLIC PANEL
BUN: 20 mg/dL (ref 6–23)
CHLORIDE: 105 meq/L (ref 96–112)
CO2: 30 meq/L (ref 19–32)
CREATININE: 0.62 mg/dL (ref 0.40–1.20)
Calcium: 8.7 mg/dL (ref 8.4–10.5)
GFR: 96.26 mL/min (ref >60.00)
GLUCOSE: 105 mg/dL — AB (ref 70–99)
POTASSIUM: 4.5 meq/L (ref 3.5–5.1)
Sodium: 140 mEq/L (ref 135–145)

## 2017-09-04 LAB — HEPATIC FUNCTION PANEL
ALBUMIN: 2.8 g/dL — AB (ref 3.5–5.2)
ALT: 8 U/L (ref 0–35)
AST: 16 U/L (ref 0–37)
Alkaline Phosphatase: 73 U/L (ref 39–117)
Bilirubin, Direct: 0.1 mg/dL (ref 0.0–0.3)
TOTAL PROTEIN: 5.7 g/dL — AB (ref 6.0–8.3)
Total Bilirubin: 0.4 mg/dL (ref 0.2–1.2)

## 2017-09-04 LAB — POC URINALSYSI DIPSTICK (AUTOMATED)
BILIRUBIN UA: NEGATIVE
GLUCOSE UA: NEGATIVE
KETONES UA: NEGATIVE
NITRITE UA: NEGATIVE
PH UA: 6 (ref 5.0–8.0)
Spec Grav, UA: 1.025 (ref 1.010–1.025)
Urobilinogen, UA: 0.2 E.U./dL

## 2017-09-04 MED ORDER — NITROFURANTOIN MONOHYD MACRO 100 MG PO CAPS: 100.0000 mg | ORAL_CAPSULE | Freq: Every day | ORAL | 1 refills | Status: AC

## 2017-09-04 MED ORDER — MIRTAZAPINE 15 MG PO TABS: 15.0000 mg | ORAL_TABLET | Freq: Every day | ORAL | 2 refills | Status: DC

## 2017-09-04 MED ORDER — CYANOCOBALAMIN 1000 MCG/ML IJ SOLN: INTRAMUSCULAR | 1 refills | Status: DC

## 2017-09-04 NOTE — Progress Notes (Signed)
Dr. Frederico Hamman T. Nyaira Hodgens, MD, [facility] and Sports Medicine [place], [address] Phone: [phone] Fax: [phone]  09/04/2017  Patient: [name] [name], MRN: [id number], DOB: 11-05-1925, 83 y.o.  Primary Physician:  Owens Loffler, MD   Chief Complaint  Patient presents with  . Hospitalization Follow-up   Subjective:   [name] [name] is a 83 y.o. very pleasant female patient who presents with the following:  Admit date: 07/29/2017 Discharge date: 08/03/2017  Admitted and discharged at that time for urosepsis, and she has a history of multiple complicated UTI's. Multiple other complicated issues in a 83 year old patient.   BMET, K, Mg BP and pulse - lopressor  Urology? She has had recurrent UTIs as well as urosepsis multiple times, and is interested in potential intervention for decreasing this risk. At this point she prefers to not see urology if at all possible and decrease all doctors appointments.  She also has congestive heart failure, diagnosed in January 2018, as well as severe aortic stenosis. At this point, she really would like to not see cardiology if there is any way possible that we can manage this without their assistance. She currently is on palliative care.  Sleep - hard time sleeping since October. Sometimes dosoriented and vivid dream.  Sometimes anxiety at night.   Edema? Increased edema, this has improved with some Lasix. Her weight has been fluctuating.  Prophylactic abx for UTI.   Past Medical History, Surgical History, Social History, Family History, Problem List, Medications, and Allergies have been reviewed and updated if relevant.  Patient Active Problem List   Diagnosis Date Noted  . Sepsis ([place]) 07/30/2017  . Chronic systolic CHF (congestive heart failure) ([place]) 07/30/2017  . Pressure injury of skin 07/30/2017  . Delirium 04/25/2017  . Pseudomonas sepsis ([place]) 04/25/2017  . H/O: CVA  (cerebrovascular accident) 04/25/2017  . Severe aortic stenosis 04/23/2017  . New onset atrial fibrillation ([place]) 04/23/2017  . AKI (acute kidney injury) ([place]) 04/23/2017  . Severe sepsis ([place]) 04/22/2017  . UTI (urinary tract infection) 04/22/2017  . B12 deficiency anemia 11/10/2016  . Elevated troponin 10/27/2016  . Protein-calorie malnutrition, severe ([place]) 10/27/2016  . Osteoarthritis of lumbar spine 10/07/2011  . Essential hypertension 01/12/2009  . Aneurysm of thoracic aorta ([place]) 01/12/2009  . CARCINOMA, BASAL CELL, HX OF 11/07/2008  . RENAL CALCULUS, HX OF 11/07/2008  . Iron deficiency anemia 11/04/2008  . COPD 10/17/2002  . DIVERTICULAR DISEASE 10/17/1986    Past Medical History:  Diagnosis Date  . Aortic stenosis    a. EF of 4045%, normal wall motion, moderate concentric LHV, GR1DD, aortic valve was severely thickened and severely calcified with severe aortic stenosis with a mean gradient of 39 mmHg, VTI 0.86 cm^2, Vmax 0.76 cm^2. There was mild AI. The aortic root was mildly dilated at 45 mm and the ascending aorta was dilated at 50 mm. There was trivial MR. RV systolic fxn normal. Mild TR/PR. PASP nl  . B12 deficiency anemia    s/p Billroth 2  . Carcinoma, basal cell, skin   . Chronic systolic CHF (congestive heart failure) (HCC)    a. see echo in pmh  . COPD (chronic obstructive pulmonary disease) ([place])   . CVA (cerebral vascular accident) ([place]) 10/26/2016  . Diplopia   . Diverticular disease   . Hypertension    Unspecified  . Malnutrition ([place])   . Physical deconditioning   . PUD (peptic ulcer disease)   .  Renal calculus   . Stroke ([place]) 10/2016  . Thoracic aortic aneurysm (HCC)    7 cm?  Marland Kitchen TIA 06/01/2007   Qualifier: Diagnosis of  By: Maxie Better FNP, Rosalita Levan   . TIA (transient ischemic attack) 2010    Past Surgical History:  Procedure Laterality Date  . BACK SURGERY  1986  . BASAL CELL CARCINOMA EXCISION    . Chest CT  10/21/2002   Ascending  aneurysm; 5.5 X 5.5  . HERNIA REPAIR    . PARTIAL HYSTERECTOMY     for Fibroids  . STOMACH SURGERY  1968   X2 Billroth II; Vagotomysubtotal gastric/ gastrectomy (1989)  . TUBAL LIGATION      Social History   Socioeconomic History  . Marital status: Married    Spouse name: Not on file  . Number of children: Not on file  . Years of education: Not on file  . Highest education level: Not on file  Social Needs  . Financial resource strain: Not on file  . Food insecurity - worry: Not on file  . Food insecurity - inability: Not on file  . Transportation needs - medical: Not on file  . Transportation needs - non-medical: Not on file  Occupational History  . Occupation: Retired  Tobacco Use  . Smoking status: Never Smoker  . Smokeless tobacco: Never Used  Substance and Sexual Activity  . Alcohol use: No  . Drug use: No  . Sexual activity: Not on file  Other Topics Concern  . Not on file  Social History Narrative   Lives with husband.    4 children, 2 sons and 2 daughters: Irving Burton and Dorian Pod live close by.   Exercise: minimal due to arthritis   Diet: heart healthy   Living Will: Has one but not sure what is on it.                Family History  Problem Relation Age of Onset  . Cholecystitis Mother 59  . Stroke Father 26  . Cancer Maternal Aunt        facial CA  . Heart attack Neg Hx   . Other Neg Hx        CV/HBP  . Hypertension Neg Hx     No Known Allergies  Medication list reviewed and updated in full in [place].   GEN: No acute illnesses, no fevers, chills. GI: No n/v/d, eating normally Pulm: No SOB Interactive and getting along well at home.  Otherwise, ROS is as per the HPI.  Objective:   BP 93/65   Pulse 90   Temp 98.3 F (36.8 C) (Oral)   Wt 105 lb 8 oz (47.9 kg)   BMI 18.11 kg/m   GEN: WDWN, NAD, Non-toxic, A & O x 3 HEENT: Atraumatic, Normocephalic. Neck supple. No masses, No LAD. Ears and Nose: No external  deformity. CV: RRR, 4/6 SEM. No JVD. No thrill. No extra heart sounds. PULM: CTA B, no wheezes, crackles, rhonchi. No retractions. No resp. distress. No accessory muscle use. EXTR: No c/c/1+ LE edema NEURO Normal gait.  PSYCH: Normally interactive. Conversant. Not depressed or anxious appearing.  Calm demeanor.   Laboratory and Imaging Data: Results for orders placed or performed in visit on 40/10/27  Basic metabolic panel  Result Value Ref Range   Sodium 140 135 - 145 mEq/L   Potassium 4.5 3.5 - 5.1 mEq/L   Chloride 105 96 - 112 mEq/L   CO2 30  19 - 32 mEq/L   Glucose, Bld 105 (H) 70 - 99 mg/dL   BUN 20 6 - 23 mg/dL   Creatinine, Ser 0.62 0.40 - 1.20 mg/dL   Calcium 8.7 8.4 - 10.5 mg/dL   GFR 96.26 >60.00 mL/min  Magnesium  Result Value Ref Range   Magnesium 2.0 1.5 - 2.5 mg/dL  Hepatic function panel  Result Value Ref Range   Total Bilirubin 0.4 0.2 - 1.2 mg/dL   Bilirubin, Direct 0.1 0.0 - 0.3 mg/dL   Alkaline Phosphatase 73 39 - 117 U/L   AST 16 0 - 37 U/L   ALT 8 0 - 35 U/L   Total Protein 5.7 (L) 6.0 - 8.3 g/dL   Albumin 2.8 (L) 3.5 - 5.2 g/dL  POCT Urinalysis Dipstick (Automated)  Result Value Ref Range   Color, UA yellow    Clarity, UA clear    Glucose, UA negative    Bilirubin, UA negative    Ketones, UA negative    Spec Grav, UA 1.025 1.010 - 1.025   Blood, UA trace    pH, UA 6.0 5.0 - 8.0   Protein, UA trace    Urobilinogen, UA 0.2 0.2 or 1.0 E.U./dL   Nitrite, UA negative    Leukocytes, UA Large (3+) (A) Negative     Assessment and Plan:   Protein-calorie malnutrition, severe ([place]) - Plan: Hepatic function panel  Recurrent UTI - Plan: POCT Urinalysis Dipstick (Automated), Urine Culture  Hypomagnesemia - Plan: Magnesium  Severe aortic stenosis  Chronic systolic CHF (congestive heart failure) (HCC)  New onset atrial fibrillation (HCC)  Long-term use of high-risk medication - Plan: Basic metabolic panel  Primary insomnia  GAD (generalized  anxiety disorder)  >40 minutes spent in face to face time with patient, >50% spent in counselling or coordination of care:  Hospital follow-up, long time until the patient was able to come see me. She is currently on palliative care. We discussed goals, and she is not overly enthusiastic about interventions and aggressive care.  CHF: Altered Lasix dosing as below. Continue other cardiac medications.  A. fib: Continue with Lopressor as well as Eliquis  Insomnia and GAD, add Remeron 15 mg  Recurrent UTI, I [place] offered urology involvement, but she does not want to do that if at all possible and would like to stay home with her family. I'm to place her on some prophylactic Macrobid.  Albumin is 2.8, encourage PO intake.  Follow-up: Return in about 6 months (around 03/04/2018).  Future Appointments  Date Time Provider [place]  09/11/2017 11:15 AM Knox Royalty, RN [facility] None  09/21/2017  1:00 PM Knox Royalty, RN [facility] None    Meds ordered this encounter  Medications  . metoprolol tartrate (LOPRESSOR) 25 MG tablet    Sig: Take 12.5 mg 2 (two) times daily by mouth.  . cyanocobalamin (,VITAMIN B-12,) 1000 MCG/ML injection    Sig: INJECT 1 ML INTERMUSCLUARLLY ONCE A MONTH.    Dispense:  12 mL    Refill:  1  . mirtazapine (REMERON) 15 MG tablet    Sig: Take 1 tablet (15 mg total) at bedtime by mouth.    Dispense:  30 tablet    Refill:  2  . nitrofurantoin, macrocrystal-monohydrate, (MACROBID) 100 MG capsule    Sig: Take 1 capsule (100 mg total) daily by mouth.    Dispense:  90 capsule    Refill:  1   Medications Discontinued During This Encounter  Medication Reason  .  levofloxacin (LEVAQUIN) 500 MG tablet Completed Course  . Nutritional Supplements (ENSURE ORIGINAL) LIQD Completed Course  . metoprolol tartrate (LOPRESSOR) 25 MG tablet Change in therapy  . cyanocobalamin (,VITAMIN B-12,) 1000 MCG/ML injection Reorder   Orders Placed This Encounter  Procedures    . Urine Culture  . Basic metabolic panel  . Magnesium  . Hepatic function panel  . POCT Urinalysis Dipstick (Automated)    Signed,  Russ Looper T. Trenese Haft, MD   Allergies as of 09/04/2017   No Known Allergies     Medication List        Accurate as of 09/04/17 11:59 PM. Always use your most recent med list.          acetaminophen 325 MG tablet Commonly known as:  TYLENOL Take 2 tablets (650 mg total) by mouth every 6 (six) hours as needed for mild pain (or Fever >/= 101).   ANACIN 400-32 MG Tabs Generic drug:  Aspirin-Caffeine Take 2 tablets by mouth daily as needed (eadache).   atorvastatin 10 MG tablet Commonly known as:  LIPITOR Take 1 tablet (10 mg total) by mouth daily.   cyanocobalamin 1000 MCG/ML injection Commonly known as:  (VITAMIN B-12) INJECT 1 ML INTERMUSCLUARLLY ONCE A MONTH.   ELIQUIS 2.5 MG Tabs tablet Generic drug:  apixaban TAKE 1 TABLET TWICE DAILY   furosemide 20 MG tablet Commonly known as:  LASIX Take one tablet by mouth x 2 day, then take daily prn for swelling   levothyroxine 25 MCG tablet Commonly known as:  SYNTHROID, LEVOTHROID Take 1 tablet (25 mcg total) by mouth daily before breakfast.   magnesium oxide 400 (241.3 Mg) MG tablet Commonly known as:  MAG-OX Take 1 tablet (400 mg total) by mouth daily.   metoprolol tartrate 25 MG tablet Commonly known as:  LOPRESSOR Take 12.5 mg 2 (two) times daily by mouth.   mirtazapine 15 MG tablet Commonly known as:  REMERON Take 1 tablet (15 mg total) at bedtime by mouth.   multivitamin with minerals Tabs tablet Take 1 tablet by mouth daily.   nitrofurantoin (macrocrystal-monohydrate) 100 MG capsule Commonly known as:  MACROBID Take 1 capsule (100 mg total) daily by mouth.   nystatin cream Commonly known as:  MYCOSTATIN APPLY 1 APPLICATION TOPICALLY 2 (TWO) TIMES DAILY   PRESERVISION AREDS 2 Caps Take 1 capsule by mouth 2 (two) times daily.

## 2017-09-04 NOTE — Patient Instructions (Signed)
Lasix 1/2 tablet every other day for now scheduled.   If weight / fluid goes up more than 3 pounds, give the additional 1/2 a tablet.

## 2017-09-05 ENCOUNTER — Other Ambulatory Visit: Payer: Self-pay | Admitting: *Deleted

## 2017-09-05 ENCOUNTER — Encounter: Payer: Self-pay | Admitting: Family Medicine

## 2017-09-05 ENCOUNTER — Encounter: Payer: Self-pay | Admitting: *Deleted

## 2017-09-05 LAB — URINE CULTURE
MICRO NUMBER: 81303112
SPECIMEN QUALITY: ADEQUATE

## 2017-09-05 NOTE — Patient Outreach (Signed)
[name]) Care Management [place] Telephone Outreach, Transition of Care day 27  09/05/2017  [place] 12-Jan-1926 [id number]  Successful telephone outreach to [name] [name], daughter/ caregiver, on [facility] CM written consent, of [name] [name], 83 y.o.femaleoriginally referred to [facility] CM by insurance provider for transition of care after recent hospitalization October 13-18, 2018 for recurrent UTI/ sepsis with encephalopathy.Patient was discharged home to self-carewithout home health [facility]) services.Patient has history including, but not limited to, severe AS, Atrial Fibrillation, sCHF, COPD, HTN, recurrent UTI, previous thoracic aortic aneurysm,previous CVA,and protein calorie malnutrition.HIPAA/ identity confirmed with [name] [name] during phone call today.  Today,Sharonreports thatpatient "is doing okay," and she denies that patient is in pain/ distress, and she denies new patient falls today.  [name] [name] reports that over the past weekend, patient had increased swelling with weight gain from 100 pounds to "about 104 pounds;" reports that she administered diuretic as previously ordered, but that as a result, patient's weight decreased overnight by "over 10 pounds, to "93 or 94 lbs."  [name] [name] reports this drastic weight loss concerned her, and confirmed that she discussed with PCP during yesterday's office visit.  [name] [name] denies that patient has thus far experienced shortness of breath outside of her baseline with all weight gain.  [name] [name] further reports:  Provider appointments: --confirmed thatpatient attended PCP appointment on Monday, September 04, 2017; [name] [name] reports several medication changes (see medication section below)  Medications: -- Has all medicationsand takes as prescribed;awaiting arrival of newly prescribed antibiotic (as of PCP office visit yesterday) from [place], for UTI prevention/ avoidance; caregiver reports "should come soon,"  reports PCP instructed to take when medication arrives, with no need to start urgently. -- reviewed with caregiver new instructions for diuretic therapy as of PCP office visit yesterday:  Patient now taking lasix 10 mg every other day; PCP action plan includes that patient should take an additional 10 mg if she experienced weight gain > 3-5 lbs overnight, OR has significantly increased LE swelling -- reviewed with caregiver newly added sleep aid for patient, as of PCP office visit yesterday, 11/191/8; caregiver reports that patient took sleep aid last night, and reported to caregiver that it 'worked pretty good;" stating that she woke up during night, but was able to "fall back asleep" without delay. -- caregiver is able to verbalize accurate reporting of ll newly added medications, and she denies further questions/ concerns around patient's medications  Safety/ Mobility/ Falls: -- denies new/ recent falls --confirms that patient continues using assistive devices/ rolling walker "all the time," and has assistance with ambulation from family memberswith all ambulation  Self-health management ofrecurrent UTI, sepsis, CHF, andatrial fibrillation in setting of palliative care goals:  --patient's caregiverreports patient "continues trying todrink more water in very small amounts (4 ounces BID)." [name] [name] reports that patient has tolerated this small increase without difficulty.  Denies patient signs/ symptoms UTI; again discussed need to balance daily fluid management around staying hydrated without becoming fluid overloaded in setting of concurrent CHF  -- reports patient has continues monitoring and recording daily weights; reviewed all recent weights with caregiver (as noted above) and [name] [name] reports  patient's weight today of "102.8 lbs this morning," and confirmed that patient's legs do not appear significantly swollen over her baseline; positive reinforcement provided for caregiver monitoring  and recording daily weights, and for monitoring patient's swelling.  [name] [name] again denies that patient did not experience dyspnea/ shortness of breath outside of her baseline during past weekend period of increased swelling with  weight gain.  -- Reiterated weight gain guidelines to notify patient's care providers for weight gain >3 lbs overnight/ 5 lbs in one week  -- provided/ reviewed education around CHF zones with corresponding action plans for each zone; [name] [name] reports patient "in green zone" today.  -- patientcontinueswearingcompression stockings bilaterally"every day during the daytime hours."  -- family continues monitoring/ recording patient's BP "every day," and confirms "no changes" around patient's baseline "lower readings;" reports that she discussed patient's baseline low blood pressures with PCP, and that PCP did not make changes to BP medications; [name] [name] denies that patient has experienced dizziness with ongoing lower BP readings;   -- caregiver reports patient has not complained of/ experienced signs' symptoms of A-Fib  -- caregiver reports that family initially heard back from [place] NP, but adds today that since recent call from NP "about 2 weeks ago", they have not heard back. Caregiver stated that she or her sister will continue waiting for NP call to schedule next home visit; various options around [place] discussed with patient's caregiver today.  Caregiverdeniesfurther issues, concerns, or problems today. Iconfirmed that patient/ caregiverhavemy direct phone number, the main [facility] CM office phone number, and the [facility] CM 24-hour nurse advice phone number should issues arise prior to next scheduled [place] outreach for ongoing transition of care by phone next week.  Plan:  Patient will take all medications as prescribed and will attend all scheduled provider office visits post-hospital discharge  Patient  willcontinue monitoring and recording daily weights andwillcontinue monitoring and recordingdailyBP's  Patient/ caregiver will promptly notify providers of any new concerns, issues, or problems that arise  Patient/ caregiver will review educational material provided to them during [place] initial home visit as needed  Patient will continue trying techniques to conservatively improve/ gradually increase her daily hydration  [place] outreach for ongoing transition of care to continue withscheduledtelephone outreachnext week.  [facility] CM Care Plan Problem One     Most Recent Value  Care Plan Problem One  Risk for hospital readmission related to multiple recent hospitalizations for UTI/Sepsis   Role Documenting the Problem One  Care Management [place] for Problem One  Active  THN Long Term Goal   Over the next 31 days, patient will not experience hospital readmission, as evidenced by patient/ caregiver reporting and review of EMR during [facility] RN CM outreach  [facility] Long Term Goal Start Date  08/10/17  Interventions for Problem One Long Term Goal  Discussed with patient's caregiver/ daughter patient's current clinical status,  reviewed recent patient weights and use of medications,  confirmed that patient attended scheduled PCP office visit yesterday, and reviewed post-visit PCP instructions with caregiver  THN CM Short Term Goal #1   Over the next 30 days, patient will take medications as prescribed, as evidenced by patient/ caregiver reporting during [place] outreach  [facility] CM Short Term Goal #1 Start Date  08/10/17  Interventions for Short Term Goal #1  Reviewed with caregiver patient's recent need for prn diuretic therapy,  discussed changes made to patient's medications as a result of yesterday's PCP office visit,  reviewed weight gain guidelines with caregiver for CHF self-health management, and answered caregiver questions regarding all new medications  THN CM Short  Term Goal #2   Over the next 30 days, patient will not experience recurrent UTI, as evidenced by patient/ caregiver reporting and review of EMR during [facility] RN CCM outreach  [facility] CM Short  Term Goal #2 Start Date  08/10/17  Interventions for Short Term Goal #2  confirmed with patient's caregiver that patient has not experienced signs/ symptoms UTI, confirmed that patient has made progress with gradually increasing hydration, discussed newly added prophylactic antibiotic during PCP office visit yesterday for UTI avoidance    [facility] CM Care Plan Problem Two     Most Recent Value  Care Plan Problem Two  Knowledge deficit related to self-health managment of chronic disease state of CHF in setting of elderly patient with palliative care goals, as evidenced by patient/ caregiver reporting of same  Role Documenting the Problem Two  Care Management Coordinator  Care Plan for Problem Two  Active  Interventions for Problem Two Long Term Goal   Discussed with patient's caregiver current clinical status of patient,  confirmed that patient has continued monitoring and recording daily weights and reviewed recent weights with patient's caregiver,  reiterated CHF zones with caregiver today and confirmed that patient is in "green" zone today, and discussed patient's current Palliative Care provider follow up/ options   THN Long Term Goal  Over the next 54 days, patient and caregiver will be able to verbalize 3 strategies for self-health management of chronic disease state of CHF in setting of palliative care goals, as evidenced by patient and caregiver reporting during [facility] RN CCM outreach  [place] Term Goal Start Date  08/24/17     Oneta Rack, RN, BSN, [place] Coordinator [facility] Care Management  [phone]

## 2017-09-11 ENCOUNTER — Other Ambulatory Visit: Payer: Self-pay | Admitting: *Deleted

## 2017-09-11 ENCOUNTER — Encounter: Payer: Self-pay | Admitting: *Deleted

## 2017-09-11 NOTE — Patient Outreach (Signed)
[place] [facility]) Care Management [place] Telephone Outreach, Transition of Care day 33  09/11/2017  [place] 1926-05-11 [id number]  Successful telephone outreach to [name], daughter/ caregiver, on [facility] CM written consent, ofFrances H [name],83 y.o.femaleoriginally referred to [facility] CM by insurance provider for transition of care after recent hospitalization October 13-18, 2018 for recurrent UTI/ sepsis with encephalopathy.Patient was discharged home to self-carewithout home health [facility]) services.Patient has history including, but not limited to, severe AS, Atrial Fibrillation, sCHF, COPD, HTN, recurrent UTI, previous thoracic aortic aneurysm,previous CVA,and protein calorie malnutrition.HIPAA/ identity confirmed with [name] during phone call today.  Today,Sharonreports thatpatient "is doing pretty good, but caught a cold last week;" caregiver denies that patient is in pain/ distress, and denies new patient falls since last [place] outreach.  [name] reports that "since late last week," patient developed a cough and runny nose; [name] contacted patient's PCP and started OTC Mucinex therapy, which she reports "has helped."  [name] reports that patient has "gradually gotten better," and "seems to be feeling pretty good today," and that her "cough is much better."  Provided positive reinforcement for caregiver notifying PCP promptly with new symptoms, and encouraged her to maintain close contact with PCP should patient's symptoms progress/ worsen, or not promptly resolve; [name] verbalizes understanding and agreement with this plan.  [name] further reports:  Medications: -- Has all medicationsand is taking as prescribed;reports newly prescribed antibiotic (as of PCP office visit 09/04/17) has arrived from [place], for UTI prevention/ avoidance; caregiver reports patient taking as prescribed.  -- confirmed with caregiver that patient has continued  taking diuretic therapy, as newly prescribed by PCP 09/04/17 (lasix 10 mg every other day);  Reviewed PCP action plan that patient should take an additional 10 mg if she experienced weight gain > 3-5 lbs overnight, OR has significantly increased LE swelling, both of which caregiver denies today, stating that patient has not required additional doses of lasix since 09/04/17. -- patient only taking newly added sleep aid "occasionally," stating that patient wold prefer not to take this medication if possible, as she believes it makes her "more groggy" during the day. -- caregiver continues to verbalize accurate reporting of all medications and denies further questions/ concerns around patient's medications  Self-health management ofrecurrent UTI, sepsis, CHF, andatrial fibrillation in setting of palliative care goals:  --reportspatient"continues trying todrink more water every day, in very small amounts (4 ounces BID)."[name] reports that patient has continued tolerating this small increase without difficulty. Denies patient signs/ symptoms UTI  -- reports patienthas continues monitoring and recording daily weights; reviewed all recent weights with caregiver and [name] reports  patient's weight today of "103.0 lbs this morning," and confirmed that patient's legs do not appear significantly swollen over her baseline; positive reinforcement provided for caregiver monitoring and recording daily weights, and for monitoring patient's swelling. [name] again denies that patient did not experience dyspnea/ shortness of breath outside of her baseline during past week.    --Confirmed that caregiver is able to verbalize weight gain guidelines to notify patient's care providers for weight gain >3 lbs overnight/ 5 lbs in one week  -- patientcontinueswearingcompression stockings bilaterally"every day during the daytime hours."  -- familycontinuesmonitoring/ recordingpatient's BP "every day,"  and confirms "no changes" around patient's baseline "lower readings;" [name] denies that patient has experienced dizziness or other concerning symptoms with ongoing lower BP readings; states, "her blood pressures just run low all the time."  Caregiverdeniesfurther issues, concerns, or problems today. Iconfirmed that patient/  caregiverhavemy direct phone number, the main THN CM office phone number, and the [facility] CM 24-hour nurse advice phone number should issues arise prior to next scheduled [place] outreach with routine home visit confirmed for next week.  Plan:  Patient will take all medications as prescribed and will attend all scheduled provider office visits post-hospital discharge  Patient willcontinuemonitoring and recording daily weights andwillcontinue monitoring and recordingdailyBP's  Patient/ caregiver will promptly notify providers of any new concerns, issues, or problems that arise  Patient/ caregiver will continue reviewing educational material provided to [place] initial home visit, as needed  Patient will continue trying techniques toconservativelyimprove/ gradually increase her daily hydration  [place] outreach to continue withscheduledroutine home visitnext week.  [facility] CM Care Plan Problem One     Most Recent Value  Care Plan Problem One  Risk for hospital readmission related to multiple recent hospitalizations for UTI/Sepsis   Role Documenting the Problem One  Care Management Coordinator  Care Plan for Problem One  Not Active  THN Long Term Goal   Over the next 31 days, patient will not experience hospital readmission, as evidenced by patient/ caregiver reporting and review of EMR during [facility] RN CM outreach  [facility] Long Term Goal Start Date  08/10/17  [facility] Long Term Goal Met Date  09/11/17  THN CM Short Term Goal #1   Over the next 30 days, patient will take medications as prescribed, as evidenced by patient/ caregiver  reporting during [place] outreach  [facility] CM Short Term Goal #1 Start Date  08/10/17  [facility] CM Short Term Goal #1 Met Date  09/11/17  THN CM Short Term Goal #2   Over the next 30 days, patient will not experience recurrent UTI, as evidenced by patient/ caregiver reporting and review of EMR during [facility] RN CCM outreach  [facility] CM Short Term Goal #2 Start Date  08/10/17  [facility] CM Short Term Goal #2 Met Date  09/11/17    [facility] CM Care Plan Problem Two     Most Recent Value  Care Plan Problem Two  Knowledge deficit related to self-health managment of chronic disease state of CHF in setting of elderly patient with palliative care goals, as evidenced by patient/ caregiver reporting of same  Role Documenting the Problem Two  Care Management Coordinator  Care Plan for Problem Two  Active  Interventions for Problem Two Long Term Goal   Discussed with patient's caregiver current clinical status of patient,  confirmed that patient has continued monitoring and recording daily weights and reviewed recent weights with patient's caregiver,  discussed patient's recent symptoms of cough, runny nose, and encouraged caregiver to promptly notify patient's PCP for ongoing/ worsening symptoms,  discussed OTC cold meds with patient's caregiver, and confirmed next week's [facility] CCM RN home visit   THN Long Term Goal  Over the next 54 days, patient and caregiver will be able to verbalize 3 strategies for self-health management of chronic disease state of CHF in setting of palliative care goals, as evidenced by patient and caregiver reporting during [facility] RN CCM outreach  [facility] Long Term Goal Start Date  08/24/17     Oneta Rack, RN, BSN, [employer] Coordinator [facility] Care Management  [phone]

## 2017-09-14 DIAGNOSIS — R531 Weakness: Secondary | ICD-10-CM | POA: Diagnosis not present

## 2017-09-18 ENCOUNTER — Telehealth: Payer: Self-pay | Admitting: *Deleted

## 2017-09-18 ENCOUNTER — Encounter: Payer: Self-pay | Admitting: *Deleted

## 2017-09-18 ENCOUNTER — Other Ambulatory Visit: Payer: Self-pay | Admitting: *Deleted

## 2017-09-18 MED ORDER — AZITHROMYCIN 250 MG PO TABS: ORAL_TABLET | ORAL | 0 refills | Status: AC

## 2017-09-18 NOTE — Patient Outreach (Addendum)
[place] [facility]) Care Management [place] Telephone Outreach  09/18/2017  [name] [name] 11/03/1925 [id number]   Successful telephone outreach to [name] [name], daughter/ caregiver, on [facility] CM written consent, ofFrances H [name],83 y.o.femaleoriginally referred to [facility] CM by insurance provider for transition of care after recent hospitalization October 13-18, 2018 for recurrent UTI/ sepsis with encephalopathy.Patient was discharged home to self-carewithout home health [facility]) services.Patient has history including, but not limited to, severe AS, Atrial Fibrillation, sCHF, COPD, HTN, recurrent UTI, previous thoracic aortic aneurysm,previous CVA,and protein calorie malnutrition.HIPAA/ identity confirmed with [name] [name] during phone call today.  Today,Sharonreports thatpatient "has gotten worse" after her previously reported cold last week.  [name] [name] reports that patient has had increased episodes of "strong cough that is wearing her out;" and states that patient occasionally expectorates "thick green mucous."  [name] [name] denies that patient has a fever, confirms that patient is continuing taking all medications as prescribed without changes to any, and that patient has continued monitoring and recording daily weights; reports patient weight ranges "continue right at 104 lbs," without any significant increases in weights over weekend.  [name] [name] confirms that patient has no increased LE swelling over her baseline, stating, "her cold just seems to be getting worse every day."  Reports patient has continued taking mucinex "since symptoms started after thanksgiving." [name] [name] states patient "is wiped out and exhausted" from all of her increased coughing, and stated that patient "just doesn't feel up to" leaving the house to go see the doctor."  [name] [name] reports that [name] Pew, NP with Palliative care, actively involved in patient's care, visited with patient on Thursday afternoon 09/14/17, and is to follow  up on patient's reported symptoms at some point this week.  Discussed with [name] [name] options of contacting Palliative Care NP, since he visited with patient recently, as well as option of contacting patient's PCP to report these worsening cold symptoms, should either provider wish to add an antibiotic, etc.  Offered to make [place] acute home visit tomorrow, and [name] [name] stated she would let me know the outcome of her calls to Palliative Care NP and to patient's PCP.   Caregiver [name] [name] issues, concerns, or problems today. Iconfirmed that patient/ caregiverhavemy direct phone number, the main THN CM office phone number, and the [facility] CM 24-hour nurse advice phone number should issues arise prior to next scheduled [place] outreach with routine home visit planned for later this week, with possible acute home visit tomorrow.  Addendum from 09/18/17 at 1:00 pm:  Received secure voicemail message from [place], patient's daughter/ caregiver, who confirmed that she had reached out to Palliative care NP, who visited with patient in person at her home late last week; in her message, [name] [name] confirmed that she had reached out to Palliative Care team, who are to be in touch with patient's PCP to add new antibiotic for patient's worsening cough/ cold symptoms.  In her message, [name] [name] stated she did not need a return call-back, nor an acute home visit from [place], as offered to her earlier today.  [name] [name] confirmed [place] routine home visit for later this week.  Plan:  Patient will take all medications as prescribed and will attend all scheduled provider office visits post-hospital discharge  Patient willcontinuemonitoring and recording daily weights andwillcontinue monitoring and recordingdailyBP's  Patient/ caregiver will promptly notify providers of any new concerns, issues, or problems that arise-- caregiver to contact palliative care NP and patient's PCP today about  reported worsening cold symptoms  [facility] CM  outreach to continue withscheduledroutine home visitvs. possible acute home visit later this week.  [facility] CM Care Plan Problem Two     Most Recent Value  Care Plan Problem Two  Knowledge deficit related to self-health managment of chronic disease state of CHF in setting of elderly patient with palliative care goals, as evidenced by patient/ caregiver reporting of same  Role Documenting the Problem Two  Care Management [place] for Problem Two  Active  Interventions for Problem Two Long Term Goal   Discussed with patient's caregiver current clinical status of patient,  reviewed recent weights with patient's caregiver,  discussed patient's recent worsening of symptoms of cough, runny nose, and encouraged caregiver to notify patient's PCP and palliatiev care NP for reported ongoing/ worsening symptoms,  discussed option of [facility] Community Acute visit with patient's caregiver  [facility] Long Term Goal  Over the next 54 days, patient and caregiver will be able to verbalize 3 strategies for self-health management of chronic disease state of CHF in setting of palliative care goals, as evidenced by patient and caregiver reporting during [facility] RN CCM outreach  [place] Term Goal Start Date  08/24/17     Oneta Rack, RN, BSN, [place] Coordinator [facility] Care Management  [phone]

## 2017-09-18 NOTE — Telephone Encounter (Signed)
Yes - macrobid is for chronic uti suppression.  zpak 2 tabs po on day 1, then 1 tab po for 4 days

## 2017-09-18 NOTE — Telephone Encounter (Signed)
Copied from [place] [phone]. Topic: General - Other >> Sep 18, 2017 12:15 PM Carolyn Stare wrote: Reason for CRM:  Inez Catalina with Pallivitcare call to say that [facility] Np saw pt on Thursday for a cold per her family . Family call today to say pt is not better mucus is green in color and pt family is asking if a antibiotic can be called in    [place]   >> Sep 18, 2017 12:22 PM Carolyn Stare wrote:  Inez Catalina stated if something is called in for pt do she still need to continuce taking micro bid for her UTI  i

## 2017-09-18 NOTE — Telephone Encounter (Signed)
[name] [name] with Palliative Care notified as instructed by telephone.  Z-pak sent into [place] as instructed by Dr. Lorelei Pont.

## 2017-09-20 DIAGNOSIS — R531 Weakness: Secondary | ICD-10-CM | POA: Diagnosis not present

## 2017-09-21 ENCOUNTER — Other Ambulatory Visit: Payer: Self-pay | Admitting: *Deleted

## 2017-09-21 ENCOUNTER — Encounter: Payer: Self-pay | Admitting: *Deleted

## 2017-09-21 NOTE — Patient Outreach (Addendum)
[name]) Care Management  [name], Routine Home Visit  09/21/2017  [name] Jan 21, 1926 [id number]  [name] [name] is a 83 y.o. female originally referred to [place] by insurance provider for transition of care after recent hospitalization October 13-18, 2018 for recurrent UTI/ sepsis with encephalopathy. Patient was discharged home to self-carewithout home health [facility]) services.Patient has history including, but not limited to, severe AS, Atrial Fibrillation, sCHF, COPD, HTN, recurrent UTI, previous thoracic aortic aneurysm,previous CVA,and protein calorie malnutrition.HIPAA/ identity confirmed with patient in person during home visit today, and patient's daughter/ primary caregiver [name] is also present and actively participates in home visit today.  Pleasant 45 minute home visit.  Subjective: "I am so glad this cold is finally starting to get better.... This cough has worn me out."  Assessment:  [name] [name] appears to be recuperating from her recent new cough/ cold symptoms, reported on 09/18/17, and is taking her newly prescribed antibiotic as prescribed.  [name] [name] has experienced weight gain over the last few days and her daughter/ caregiver has been administering her diuretic therapy as prescribed around weight gain guidelines.  [name] continues to verbalize palliative care goals for her overall health, and is currently active with Palliative [facility] of [name], however, she and her family are interested in possibility of having more frequent palliative care home visits, and will discuss this possibility with her current palliative care provider.  [name] and her family have developed excellent daily strategies for self-health management of chronic disease state of CHF and Atrial Fibrillation with frequent episodes of recurrent UTI, however [name] [name] remains fragile and could benefit from ongoing at- home evaluation of her ongoing state  of health.  Today, patient reports that she believes she "is finally getting better" after her recent development of productive strong cough ~ 6 days ago.  Patient denies pain/ concerns today, and she is in no apparent/ obvious distress throughout home visit.  Patient further reports with daughter [name] participating in conversation:  Medications: -- Has all medicationsand takes as prescribed;denies questions about current medications; confirms taking both recently prescribed antitbiotic for cough/ cold symptoms and prophylactic antibiotic for UTI prevention -- reports taking OTC Mucinex and Robitussin as directed by Palliative Care provider during recent home visit with patient -- confirms taking newly prescribed diuretic as ordered, every other day, with additional (prn) doses for weight gain. --caregivercontinues managing/ providing patient's dailymedications  -- all medications were thoroughly reviewed with patient/ caregiver today.  Provider appointments: --No recent or upcoming provider appointments; patient states she "feels to weak" to go to PCP; patient's caregiver maintains close contact with patient's PCP as needed around development of concerns; again encouraged caregiver to promptly notify PCP for any new concerns, issues, or problems that arise between office visits; both verbalize understanding/ agreement  Safety/ Mobility/ Falls: -- denies new/ recent falls -- continues using assistive devices:uses rolling walker "all the time," with additional assistance from family members with all ambulation around home -- general fall risks/ prevention education reiterated with patient/ caregivertoday  Advanced Directive (AD) Planning:  -- reportsstill hasexisisting AD'sin placefor HCPOA [name]), Living Will, and "yellow DNR form posted in home" without desire to make changes -- reports took these documents to post-hospital PCP office visit, and these are now uploaded in  EMR (verified, date 09/06/17)  Self-health management ofrecurrent UTI, sepsis, CHF, and atrial fibrillation in setting of palliative care goals: --patient reports continuing "trying to stay hydrated" as "best as she can," stating that  she 'has had decreased appetite" with recent development of cold/ cough symptoms; reinforced need to balance daily fluid management around staying hydrated without becoming fluid overloaded  -- reports increased weight gain over "last few days;" reviewed recent recorded weights at home; caregiver administering diuretic as prescribed Baseline weights:  101- 104 lbs; weight today: 107 lbs; caregiver reports administered prn lasix today due to progressive weight gain over last several days  -- reviewed previously provided EMMI educational material regarding rationale/ guidelines for daily weights in setting of CHF, along with concerning signs/ symptoms of CHF and UTI; education around CHF zones with corresponding action plans for each zone; confirmed that caregiver has accurate understanding of all self-health strategies for CHF daily monitoring.  Educated caregiver on "normal" breath sounds with use of stethoscope, and stressed with caregiver that she should promptly contact patient's care providers with any new concerns, regardless of measures taken at home to assess/ monitor patient.   -- patient no longer wearing compression stockings bilaterally"since PCP visit," as patient's upper thighs were noted to be excessively swollen; bilateral LE remain visibly swollen upper legs > lower legs, without erythema/ rash.  Skin warm dry and clean.  -- family continues to monitor/ record patient's BP "every day," and reports "continues running low." Home BP measurements reviewed with patient and caregiver; discussed signs/ symptoms that would necessitate urgent follow up with care providers/ contacting EMS; patient continues to deny concerning signs/ symptoms associated with low  BP's including dizziness with sitting/ standing, confusion, decreased UOP, etc  -- [name] confirms that patient remains active with [facility].  States that Palliative Care NP has visited patient at home "last week and again this week," around patient's development of new cough/ cold symptoms.  Stated that they are very pleased with Palliative Care program, but wish that the program was available on weekends, in case the patient has "any set-backs" during off-hours.  Discussed possibility of Care Connections program with patient/ caregiver, and provided contact information for this program; encouraged patient/ caregiver to review and discuss with current palliative care provider.    Patient/ caregiverdenyfurther issues, concerns, or problems today. Iconfirmed that patient/ caregiver havemy direct phone number, the main [facility] CM office phone number, and the [facility] CM 24-hour nurse advice phone number should issues arise prior to next scheduled [place] outreach by phone in 2 weeks.  Briefly discussed possibility of [facility] CCMcase closure in coming weeks with patient and caregiver.  Objective:   Note on BP: baseline low BP's/ reviewed recent BP log from home measurements with patient's caregiver  BP (!) 84/62   Pulse 95   Resp 18   Wt 107 lb (48.5 kg)   SpO2 99%   BMI 18.37 kg/m     Review of Systems  Constitutional: Positive for malaise/fatigue.  Respiratory: Positive for cough, sputum production and shortness of breath. Negative for wheezing.        Dry cough persists; patient/ caregiver reports "less congested" with "less mucous;" reports mucous from cough no longer green in color, reports "now clear;" patient occasionally coughs during home visit, non productive   Cardiovascular: Positive for leg swelling. Negative for chest pain.       +2-3 bilateral LE edema from thighs to ankles; less visibly pronounced in lower legs, more pronounced in thighs   Gastrointestinal: Negative for abdominal pain and nausea.       Reports decreased appetite since she started having cold symptoms ~ 6 days ago  Genitourinary:  Positive for frequency and urgency.       Diuretic therapy  Musculoskeletal: Positive for myalgias. Negative for falls.       Chronic myalgia  Neurological: Negative for dizziness.       Baseline low BP; patient denies dizziness with rest or activity  Psychiatric/Behavioral: Negative for depression. The patient is not nervous/anxious.    Physical Exam  Constitutional: She is oriented to person, place, and time. No distress.  Frail elderly female  Cardiovascular: Normal rate, regular rhythm and intact distal pulses.  Murmur heard. Pulses:      Radial pulses are 2+ on the right side, and 2+ on the left side.       Dorsalis pedis pulses are 1+ on the right side, and 1+ on the left side.  Respiratory: Effort normal and breath sounds normal. No respiratory distress. She has no wheezes. She has no rales.  GI: Soft. Bowel sounds are normal.  Musculoskeletal: She exhibits edema.  See ROS  Neurological: She is alert and oriented to person, place, and time.  Skin: Skin is warm and dry. No rash noted. No erythema.  Psychiatric: She has a normal mood and affect. Her behavior is normal. Judgment and thought content normal.   Encounter Medications:   Outpatient Encounter Medications as of 09/21/2017  Medication Sig Note  . acetaminophen (TYLENOL) 325 MG tablet Take 2 tablets (650 mg total) by mouth every 6 (six) hours as needed for mild pain (or Fever >/= 101).   . Aspirin-Caffeine (ANACIN) 400-32 MG TABS Take 2 tablets by mouth daily as needed (eadache).  08/10/2017: Per daughter pt takes 2 tablets once a day   . atorvastatin (LIPITOR) 10 MG tablet Take 1 tablet (10 mg total) by mouth daily.   Marland Kitchen azithromycin (ZITHROMAX) 250 MG tablet Take 2 tabs PO x 1 dose, then 1 tab PO QD x 4 days   . cyanocobalamin (,VITAMIN B-12,) 1000 MCG/ML  injection INJECT 1 ML INTERMUSCLUARLLY ONCE A MONTH.   Marland Kitchen ELIQUIS 2.5 MG TABS tablet TAKE 1 TABLET TWICE DAILY   . furosemide (LASIX) 20 MG tablet Take one tablet by mouth x 2 day, then take daily prn for swelling 09/05/2017: Taking 10 mg every other day, per PCP instructions 09/04/17; weight gain guidelines provided during PCP office visit to take additional 10 mg every other day for weight gain/ swelling    . levothyroxine (SYNTHROID, LEVOTHROID) 25 MCG tablet Take 1 tablet (25 mcg total) by mouth daily before breakfast.   . magnesium oxide (MAG-OX) 400 (241.3 Mg) MG tablet Take 1 tablet (400 mg total) by mouth daily.   . metoprolol tartrate (LOPRESSOR) 25 MG tablet Take 12.5 mg 2 (two) times daily by mouth.   . mirtazapine (REMERON) 15 MG tablet Take 1 tablet (15 mg total) at bedtime by mouth.   . Multiple Vitamin (MULTIVITAMIN WITH MINERALS) TABS tablet Take 1 tablet by mouth daily.   . Multiple Vitamins-Minerals (PRESERVISION AREDS 2) CAPS Take 1 capsule by mouth 2 (two) times daily.   . nitrofurantoin, macrocrystal-monohydrate, (MACROBID) 100 MG capsule Take 1 capsule (100 mg total) daily by mouth. 09/05/2017: 09/04/17: reports awaiting arrival of medication through [facility]  . nystatin cream (MYCOSTATIN) APPLY 1 APPLICATION TOPICALLY 2 (TWO) TIMES DAILY (Patient taking differently: Apply 1 application topically 2 (two) times daily as needed. ) 08/10/2017: As needed.    No facility-administered encounter medications on file as of 09/21/2017.    Plan:  Patient will take all medications as prescribed and  will attend all scheduled provider office visits post-hospital discharge  Patient will continue monitoring and recording daily weights and will continue monitoring and recording daily BP's  Patient/ caregiver will promptly notify providers of any new concerns, issues, or problems that arise  Patient/ caregiver will review printed material provided to them today re: Care  Connections palliative Care program and will discuss with current palliative care provider  Patient will continue trying techniques to improve/ gradually increase her daily hydration  [place] outreach to continue with scheduled telephone outreach in 2 weeks.   [facility] CM Care Plan Problem One     Most Recent Value  Care Plan Problem One  Knowledge deficit around palliative care options in community, as evidenced by patient and caregiver reporting during home visit  Role Documenting the Problem One  Care Management [place] for Problem One  Active  [facility] CM Short Term Goal #1   Over the next 26 days, patient/ patient's caregiver will discuss various options for palliative care in the community with current community palliative care provider, as evidenced by patient's caregiver reporting during [facility] RN CCM outreach  [facility] CM Short Term Goal #1 Start Date  09/21/17  Interventions for Short Term Goal #1  Using teachback method, discussed patient/ caregiver's current palliative care provider and palliative care needs,  provided and discussed with patient's caregiver printed information on Care Connections program, and encouraged her to discuss this program and collaborate with current palliative care community provider    [facility] CM Care Plan Problem Two     Most Recent Value  Care Plan Problem Two  Knowledge deficit related to self-health managment of chronic disease state of CHF in setting of elderly patient with palliative care goals, as evidenced by patient/ caregiver reporting of same  Role Documenting the Problem Two  Care Management Coordinator  Care Plan for Problem Two  Active  Interventions for Problem Two Long Term Goal   Discussed with patient and caregiver current clinical status of patient,  reviewed recent weights with patient's caregiver,  discussed patient's use of newly prescribed antibiotic for recent cough,  discussed use of diuretic in setting of periods of weight  gain,  coached caregiver regarding signs/ symptoms to watch for with CHF/ new cough and confirmed that caregiver is able to verbalize CHF zones with corresponding action plans,  briefly instructed caregiver on use of stethoscope to listen for changes in patient's breath sounds, encouraging her to contact care providers for any new concerns/ changes in patient condition,  routine home visit/ assessment completed  [facility] Long Term Goal  Over the next 54 days, patient and caregiver will be able to verbalize 3 strategies for self-health management of chronic disease state of CHF in setting of palliative care goals, as evidenced by patient and caregiver reporting during [facility] RN CCM outreach  [facility] Long Term Goal Start Date  08/24/17     Oneta Rack, RN, BSN, [place] Coordinator [facility] Care Management  [phone]

## 2017-09-27 ENCOUNTER — Telehealth: Payer: Self-pay | Admitting: Family Medicine

## 2017-09-27 ENCOUNTER — Other Ambulatory Visit: Payer: Self-pay | Admitting: Family Medicine

## 2017-09-27 MED ORDER — CYANOCOBALAMIN 1000 MCG/ML IJ SOLN: INTRAMUSCULAR | 1 refills | Status: AC

## 2017-09-27 NOTE — Telephone Encounter (Signed)
Needs printed copy of Rx for B12- will pick up

## 2017-09-27 NOTE — Telephone Encounter (Signed)
done

## 2017-09-27 NOTE — Telephone Encounter (Signed)
[name] [name] notified prescription is ready to be picked up at the front desk.

## 2017-09-27 NOTE — Telephone Encounter (Signed)
Copied from [place]. Topic: Quick Communication - See Telephone Encounter >> Sep 27, 2017  9:32 AM Arletha Grippe wrote: CRM for notification. See Telephone encounter for:   09/27/17. Daughter called - she needs a duplicate copy of injectable b-12 for pt. Daughter isnt sure if it was never printed, or if she lost it between then and now.  Please call daughter to pick up at [phone]

## 2017-09-27 NOTE — Telephone Encounter (Signed)
Wrong office- will send to Dr. Frederico Hamman [place].

## 2017-09-29 DIAGNOSIS — H353133 Nonexudative age-related macular degeneration, bilateral, advanced atrophic without subfoveal involvement: Secondary | ICD-10-CM | POA: Diagnosis not present

## 2017-10-03 ENCOUNTER — Other Ambulatory Visit: Payer: Self-pay | Admitting: *Deleted

## 2017-10-03 ENCOUNTER — Encounter: Payer: Self-pay | Admitting: *Deleted

## 2017-10-03 NOTE — Patient Outreach (Signed)
[facility]) Care Management [place] Telephone Outreach  10/03/2017  [place] 1925-11-03 [id number]  Unsuccessful telephone outreach to [name], daughter/ primary caregiver, on [facility] CM written consent, for ANNAI HEICK, 83 y.o. female originally referred to [place] by insurance provider for transition of care after recent hospitalization October 13-18, 2018 for recurrent UTI/ sepsis with encephalopathy.Patient was discharged home to self-carewithout home health [facility]) services.Patient has history including, but not limited to, severe AS, Atrial Fibrillation, sCHF, COPD, HTN, recurrent UTI, previous thoracic aortic aneurysm,previous CVA,and protein calorie malnutrition.  HIPAA compliant voice mail message left for patient's caregiver, requesting return call back.  Plan:  Will re-attempt [place] telephone outreach within 2 weeks if I do not hear back from patient's caregiver first.  Oneta Rack, RN, BSN, [place] Coordinator [facility] Care Management  [phone]

## 2017-10-06 ENCOUNTER — Other Ambulatory Visit: Payer: Self-pay | Admitting: *Deleted

## 2017-10-06 ENCOUNTER — Encounter: Payer: Self-pay | Admitting: *Deleted

## 2017-10-06 NOTE — Patient Outreach (Signed)
[facility]) Care Management [place] Telephone Outreach  10/06/2017  [place] [birth year] [id number]  09:40 am: Unsuccessful telephone outreach to [name] [name], daughter/ primary caregiver, on [facility] CM written consent, for [employer], 83 y.o.femaleoriginally referred to [facility] CM by insurance provider for transition of care after recent hospitalization October 13-18, 2018 for recurrent UTI/ sepsis with encephalopathy.Patient was discharged home to self-carewithout home health [facility]) services.Patient has history including, but not limited to, severe AS, Atrial Fibrillation, sCHF, COPD, HTN, recurrent UTI, previous thoracic aortic aneurysm,previous CVA,and protein calorie malnutrition.  HIPAA compliant voice mail message left for patient, requesting return call back.  11:00 am: successful incoming telephone outreach from [place], caregiver daughter for patient; HIPAA/ identity confirmed during phone call today.  Today, [name] [name] reports that patient "is doing much better" after completing recently prescribed antibiotics for cough.  [name] [name] denies that patient is experiencing pain, and she denies concerns/ issues/ problems with patient's current clinical status, and states patient is in no distress.  [name] [name] further reports: -- has and is taking medications as prescribed; denies concerns/ changes around patient's current medications; continues administering diuretic therapy as prescribed, "every other day." -- continues monitoring and recording daily weights and BP's; reports all values "holding steady." Unable to review recently recorded weight values today, as caregiver reports she is caring for her granddaughter; however, [name] [name] assures me that patient has not experienced recent issues with weight gain or with BP readings.  [name] [name] is able to accurately report weight gain guidelines and action plans for CHF zones, states patient is "in green zone" today, and "has been in  green zone" since taking/ completing antibiotics. -- has discussed Care Connections Palliative Care program with current palliative care provider; reports that she is waiting for follow up from current palliative care provider, reporting that she was told by current palliative care provider that Care Connections program may be partnering with current palliative care program "soon;"  [name] [name] stated that she has plans to contact Care Connections program herself next week after holidays are completed, to see if patient's geographical location would allow her to be evaluated by Care Connections team; discussed with caregiver that I would be able to assist in [place] referral if necessary.  Patient's caregiver/ daughter denies further issues, concerns, or problems today.  I confirmed that she has my direct phone number, the main [facility] CM office phone number, and the [facility] CM 24-hour nurse advice phone number should issues arise prior to next scheduled [place] outreach with telephone call in 10 days.  Plan:  Patient will take all medications as prescribed and will attend all scheduled provider office visits post-hospital discharge  Patient willcontinue monitoring and recording daily weights andwillcontinue monitoring and recordingdailyBP's  Patient/ caregiver will promptly notify providers of any new concerns, issues, or problems that arise  Patient/ caregiver will continue reviewing printed material provided to them today re: Care Connections palliative Care program and will discuss with current palliative care provider, and will notify [facility] RN CCM if she would like assistance with placing care Connections referral  [place] outreach to continue withscheduledtelephone outreachin 10 days.  [facility] CM Care Plan Problem One     Most Recent Value  Care Plan Problem One  Knowledge deficit around palliative care options in community, as evidenced by patient and caregiver  reporting during home visit  Role Documenting the Problem One  Care Management [place] for Problem One  Active  [facility] CM  Short Term Goal #1   Over the next 26 days, patient/ patient's caregiver will discuss various options for palliative care in the community with current community palliative care provider, as evidenced by patient's caregiver reporting during [facility] RN CCM outreach  [facility] CM Short Term Goal #1 Start Date  09/21/17  Interventions for Short Term Goal #1  Discussed with patient's caregiver current palliative care provider services,  confirmed that patient's caregiver has discussed Care Connections program with current palliative care provider, and is waiting for follow up from current palliative care program in place,  confirmed that caregiver remains interested in possibility of becoming active with Care Connections program    [facility] CM Care Plan Problem Two     Most Recent Value  Care Plan Problem Two  Knowledge deficit related to self-health managment of chronic disease state of CHF in setting of elderly patient with palliative care goals, as evidenced by patient/ caregiver reporting of same  Role Documenting the Problem Two  Care Management Coordinator  Care Plan for Problem Two  Active  Interventions for Problem Two Long Term Goal   Discussed with patient and caregiver current clinical status of patient,  confirmed with patient's caregiver that she is ocntinuing to monitor and record daily weights, BP's, and CHF zones,  confirmed with caregiver that patient is clincally improved after completing recent antibiotics  [facility] Long Term Goal  Over the next 54 days, patient and caregiver will be able to verbalize 3 strategies for self-health management of chronic disease state of CHF in setting of palliative care goals, as evidenced by patient and caregiver reporting during [facility] RN CCM outreach  [facility] Long Term Goal Start Date  08/24/17     Oneta Rack, RN, BSN, [place] Coordinator [facility] Care Management  [phone]

## 2017-10-13 ENCOUNTER — Telehealth: Payer: Self-pay | Admitting: *Deleted

## 2017-10-13 MED ORDER — BUSPIRONE HCL 5 MG PO TABS: 5.0000 mg | ORAL_TABLET | Freq: Two times a day (BID) | ORAL | 3 refills | Status: DC

## 2017-10-13 NOTE — Telephone Encounter (Signed)
Copied from [place]. Topic: General - Other >> Oct 13, 2017  8:39 AM Lolita Rieger, RMA wrote: Reason for CRM: Pt daughter called and would like a call back concerning medication for anxiety [place]  >> Oct 13, 2017  8:49 AM [place] Nunnery, CMA wrote: Attempted to contact pts daughter twice. Line picked up and d/c  Spoke with [name].  She states the mirtazapine that Dr. Lorelei Pont prescribed is helping but she said her Ms. Cinque is asking if there is something that Dr. Lorelei Pont can give her for during the day.  She states she is jittery and feels like things are closing in on her.  [name] also states that her mom is very emotional.  This is the first Christmas without Mr. Burkemper.  Please advise.

## 2017-10-13 NOTE — Telephone Encounter (Signed)
Start Buspirone 5 mg, 1 po bid, #60, 3 ref  We may need to increase dose, but she is small enough and 89, we have to start with a low dose. This should help some even after a few days and work better over time.

## 2017-10-13 NOTE — Telephone Encounter (Signed)
Buspirone 5 mg Rx sent into [facility] as instructed by Dr. Lorelei Pont.  [name] notified as instructed by telephone.

## 2017-10-16 ENCOUNTER — Encounter: Payer: Self-pay | Admitting: *Deleted

## 2017-10-16 ENCOUNTER — Other Ambulatory Visit: Payer: Self-pay | Admitting: *Deleted

## 2017-10-16 NOTE — Patient Outreach (Signed)
[place] [facility]) Care Management [place] Telephone Outreach  10/16/2017  [place] 1925/10/24 [id number]  Unsuccessful telephone outreach to [name], daughter/ primary caregiver, on [facility] CM written consent, forFrances H Marseille,83 y.o.femaleoriginally referred to [facility] CM by insurance provider for transition of care after recent hospitalization October 13-18, 2018 for recurrent UTI/ sepsis with encephalopathy.Patient was discharged home to self-carewithout home health [facility]) services.Patient has history including, but not limited to, severe AS, Atrial Fibrillation, sCHF, COPD, HTN, recurrent UTI, previous thoracic aortic aneurysm,previous CVA,and protein calorie malnutrition.  HIPAA compliant voice mail message left for patient, requesting return call back.  Plan:  Will re-attempt [place] telephone outreach later this week if I do not hear back from patient/ patient's caregiver first.  Oneta Rack, RN, BSN, [place] Coordinator [facility] Care Management  [phone]

## 2017-10-18 ENCOUNTER — Other Ambulatory Visit: Payer: Self-pay | Admitting: *Deleted

## 2017-10-18 ENCOUNTER — Telehealth: Payer: Self-pay | Admitting: Family Medicine

## 2017-10-18 ENCOUNTER — Encounter: Payer: Self-pay | Admitting: *Deleted

## 2017-10-18 NOTE — Telephone Encounter (Signed)
Copied from [place]. Topic: Quick Communication - See Telephone Encounter >> Oct 18, 2017  3:55 PM Bea Graff, NT wrote: Reason for CRM: Manus Gunning from [place] calling and wanted to let Dr. Lorelei Pont know this pt has been referred to their palliative services and want to be sure he is ok with that? CB# [phone]

## 2017-10-18 NOTE — Patient Outreach (Addendum)
[place] [facility]) Care Management [place] Telephone Outreach  10/18/2017  [place] [birth year] [id number]  Successful telephone outreach to [name], daughter/ primary caregiver, on [facility] CM written consent, forFrances H Nell,84 y.o.femaleoriginally referred to [facility] CM by insurance provider for transition of care after recent hospitalization October 13-18, 2018 for recurrent UTI/ sepsis with encephalopathy.Patient was discharged home to self-carewithout home health [facility]) services.Patient has history including, but not limited to, severe AS, Atrial Fibrillation, sCHF, COPD, HTN, recurrent UTI, previous thoracic aortic aneurysm,previous CVA,and protein calorie malnutrition.HIPAA/ identity verified with patient's caregiver today.  Today, [name] reports that patient "is doing very well."  Reports patient is "somewhat tired" after holiday activities, but denies that patient is experiencing pain/ breathing issues, concerns/ issues/ problems with patient's current clinical status, and states patient is in no distress.  [name] further reports: -- has and is taking medications as prescribed; denies concerns/ changes around patient's current medications; continues administering diuretic therapy as prescribed, "every other day." -- continues monitoring and recording daily weights and BP's; reports all values "in normal range."  Reports daily weight ranges of 111-112 lbs every day since last [place] outreach.  [name] is able to accurately report weight gain guidelines and action plans for CHF zones, states patient is "in green zone" today, and "has been in green zone since her last cold (early December)" when patient was prescribed and took course of antibiotics.  -- has continued considering [place] program and reports she has "just been too busy" around holidays to place call to [facility] to obtain more information; [name] is agreeable today to  my placing this referral.  Discussed that patient has thus far met her previously established [facility] CCM goals, and we discussed possibility of case closure; discussed that I would remain involved in patient's care until [facility] is able to connect with caregiver, and [name] is agreeable to this.  Patient's caregiver/ daughter denies further issues, concerns, or problems today. I confirmed that she hasmy direct phone number, the main [facility] CM office phone number, and the [facility] CM 24-hour nurse advice phone number should issues arise prior to next scheduled [place] outreach with telephone call within 14 days pending [facility] referral.  Plan:  Patient will take all medications as prescribed and will attend all scheduled provider office visits post-hospital discharge  Patient willcontinuemonitoring and recording daily weights andwillcontinue monitoring and recordingdailyBP's  Patient/ caregiver will promptly notify providers of any new concerns, issues, or problems that arise  I will place referral to [place] program and will make patient's PCP aware of same.  [place] outreach to continue withscheduledtelephone outreachin 14 days, or sooner pending outcome of referral placed today to [facility] Palliative [facility] program.  [facility] CM Care Plan Problem One     Most Recent Value  Care Plan Problem One  Knowledge deficit around palliative care options in community, as evidenced by patient and caregiver reporting during home visit  Role Documenting the Problem One  Care Management Uncertain for Problem One  Active  THN CM Short Term Goal #1   Over the next 26 days, patient/ patient's caregiver will discuss various options for palliative care in the community with current community palliative care provider, as evidenced by patient's caregiver reporting during [facility] RN CCM outreach  [facility] CM Short Term Goal #1 Start Date   09/21/17  [facility] CM Short Term Goal #1 Met Date  10/18/17-- Goal  met  THN CM Short Term Goal #2   Over the next 14 days, patient's caregiver will discuss patient's care needs with [facility] Palliative [facility] Program, as evidenced by caregiver reporting and collaboration with care Connections team during [place] outreach  [facility] CM Short Term Goal #2 Start Date  10/18/17  Interventions for Short Term Goal #2  Discussed with caregiver [facility] program and confirmed that patient and caregiver are both interested in this program,  placed care coordination call to [facility] Connections to initiated referral    [facility] CM Care Plan Problem Two     Most Recent Value  Care Plan Problem Two  Knowledge deficit related to self-health managment of chronic disease state of CHF in setting of elderly patient with palliative care goals, as evidenced by patient/ caregiver reporting of same  Role Documenting the Problem Two  Care Management Coordinator  Care Plan for Problem Two  Not Active  THN Long Term Goal  Over the next 54 days, patient and caregiver will be able to verbalize 3 strategies for self-health management of chronic disease state of CHF in setting of palliative care goals, as evidenced by patient and caregiver reporting during [facility] RN CCM outreach  [facility] Long Term Goal Start Date  08/24/17  [facility] Long Term Goal Met Date  10/18/17-- Goal met     Oneta Rack, RN, BSN, [place] Coordinator [facility] Care Management  [phone]

## 2017-10-18 NOTE — Patient Outreach (Signed)
[name]) Care Management [place] Telephone [facility] Coordination  10/18/2017  [name] November 05, 1925 [id number]  Successful telephone outreach to [facility] with [place] program [phone]) re: [name], [name] y.o.femaleoriginally referred to [facility] CM by insurance provider for transition of care after recent hospitalization October 13-18, 2018 for recurrent UTI/ sepsis with encephalopathy.Patient was discharged home to self-carewithout home health [facility]) services.Patient has history including, but not limited to, severe AS, Atrial Fibrillation, sCHF, COPD, HTN, recurrent UTI, previous thoracic aortic aneurysm,previous CVA,and protein calorie malnutrition.  Discussed patient's overall case/ status with [name], with need to contact patient's primary caregiver/ daughter [name] [name] for referral; provided [name]'s telephone numbers to [facility], and requested that [facility] team contact me regarding outcome of referral placed today.  Plan:  Will make patient's PCP aware of referral placed today for [facility] Palliative Care program by sending secure message via EMR  Will collaborate as indicated with [facility] Palliative Care team for patient care needs  Oneta Rack, RN, BSN, [place] Coordinator [place] Management  [phone]

## 2017-10-18 NOTE — Telephone Encounter (Signed)
yes

## 2017-10-19 NOTE — Telephone Encounter (Signed)
Manus Gunning with Hospice notified by telephone that Dr. Lorelei Pont is okay with referral to Palliative Care for [name] [name].

## 2017-10-23 ENCOUNTER — Other Ambulatory Visit: Payer: Self-pay | Admitting: Family Medicine

## 2017-10-24 NOTE — Telephone Encounter (Signed)
Last Lipid Profile 07/13/2016.  Refill?

## 2017-11-01 ENCOUNTER — Other Ambulatory Visit: Payer: Self-pay | Admitting: *Deleted

## 2017-11-01 ENCOUNTER — Encounter: Payer: Self-pay | Admitting: *Deleted

## 2017-11-01 NOTE — Patient Outreach (Signed)
[place] [facility]) Care Management [place] Telephone Outreach, Case Closure  11/01/2017  [place] 12-01-1925 [id number]  Successful telephone outreach to [name], daughter/ primary caregiver, on [facility] CM written consent, forFrances H Breslin,84 y.o.femaleoriginally referred to [facility] CM by insurance provider for transition of care after recent hospitalization October 13-18, 2018 for recurrent UTI/ sepsis with encephalopathy.Patient was discharged home to self-carewithout home health [facility]) services.Patient has history including, but not limited to, severe AS, Atrial Fibrillation, sCHF, COPD, HTN, recurrent UTI, previous thoracic aortic aneurysm,previous CVA,and protein calorie malnutrition.HIPAA/ identity verified with patient's caregiver today.  Today, [name] reports that patient "is doing well."  Reports patient has had intermittent periods of weight gain, which she has been managing with patient's prn dosing of diuretics; reports diuretics "working" without patient exhibiting signs/ symptoms dehydration. [name] denies that patient is experiencing pain/ breathing issues, concerns/ issues/ problems with patient's current clinical status, and states patient is in no obvious distress.   [name] reports today that she has spoken to [place] team, and that nurse Arville Go will be visiting with patient/ caregiver "this afternoon" at their home; [name] verbalizes plans to enroll in [facility] Program.  Discussed with [name] some of the questions she has about the program, and encouraged her to write these questions down for further discussion with care Connections team this afternoon.  Active participation with care Connections program was encouraged, and Rosalie Doctor that she/ patient will actively participate and are very glad to have this program available to them.  Discussed that patient has now met her previously established [facility] CCM goals,  and we discussed [place] case closure; [name] is agreeable to this today.  I confirmed thatshehasmy direct phone number, the main [facility] CM office phone number, and the [facility] CM 24-hour nurse advice phone number should issues arise in the future.  Plan:  Will close [place] CM case, as patient has successfully met her previously established goals and is planning to enroll in [place] program this afternoon; will make [facility] CMA and patient's PCP aware of same.  [facility] CM Care Plan Problem One     Most Recent Value  Care Plan Problem One  Knowledge deficit around palliative care options in community, as evidenced by patient and caregiver reporting during home visit  Role Documenting the Problem One  Care Management [place] for Problem One  Not Active  THN CM Short Term Goal #2   Over the next 14 days, patient's caregiver will discuss patient's care needs with [facility] Palliative Care Program, as evidenced by caregiver reporting and collaboration with care Connections team during [place] outreach  [facility] CM Short Term Goal #2 Start Date  10/18/17  [facility] CM Short Term Goal #2 Met Date  11/01/17-- Goal successfully met; [facility] CCM case closure     It has been a pleasure caring for Jaconita and working with her family,  Oneta Rack, RN, BSN, [employer] [facility] Care Management  [phone]

## 2017-11-02 ENCOUNTER — Other Ambulatory Visit: Payer: Self-pay | Admitting: *Deleted

## 2017-11-02 NOTE — Patient Outreach (Signed)
[place] [facility]) Care Management [place] Telephone [facility] Coordination  11/02/2017  [place] Jun 22, 1926 [id number]  [place] outreach received from [place] with [facility]/ Palliative Care Program [facility], [phone]) [id number] [place], Yu[place] y.o.femaleoriginally referred to [facility] CM by insurance provider for transition of care after recent hospitalization October 13-18, 2018 for recurrent UTI/ sepsis with encephalopathy.Patient was discharged home to self-carewithout home health [facility]) services.Patient has history including, but not limited to, severe AS, Atrial Fibrillation, sCHF, COPD, HTN, recurrent UTI, previous thoracic aortic aneurysm,previous CVA,and protein calorie malnutrition.  Today, Arville Go confirms that patient was successfully enrolled on 11/01/17 in [place] program through [place]; I confirmed with Arville Go that [place] has now been closed.  Oneta Rack, RN, BSN, [employer] [facility] Care Management  [phone]

## 2017-11-06 ENCOUNTER — Encounter: Payer: Self-pay | Admitting: Family Medicine

## 2017-11-06 DIAGNOSIS — I10 Essential (primary) hypertension: Secondary | ICD-10-CM | POA: Diagnosis not present

## 2017-11-08 ENCOUNTER — Telehealth: Payer: Self-pay | Admitting: Family Medicine

## 2017-11-08 ENCOUNTER — Other Ambulatory Visit: Payer: Self-pay | Admitting: Family Medicine

## 2017-11-08 MED ORDER — FUROSEMIDE 20 MG PO TABS: ORAL_TABLET | ORAL | 1 refills | Status: AC

## 2017-11-08 MED ORDER — MIRTAZAPINE 15 MG PO TABS: 15.0000 mg | ORAL_TABLET | Freq: Every day | ORAL | 1 refills | Status: AC

## 2017-11-08 MED ORDER — BUSPIRONE HCL 5 MG PO TABS: 5.0000 mg | ORAL_TABLET | Freq: Two times a day (BID) | ORAL | 1 refills | Status: AC

## 2017-11-08 NOTE — Telephone Encounter (Signed)
Dr Lorelei Pont patient- next follow up due 02/2018. Patient needs refills.  (Buspar started per phone note 10/13/17)

## 2017-11-08 NOTE — Telephone Encounter (Signed)
Copied from [place]. Topic: Quick Communication - Rx Refill/Question >> Nov 08, 2017 10:14 AM Synthia Innocent wrote: Medication:  mirtazapine (REMERON) 15 MG tablet, busPIRone (BUSPAR) 5 MG tablet and  furosemide (LASIX) 20 MG tablet    Has the patient contacted their pharmacy? Yes.     (Agent: If no, request that the patient contact the pharmacy for the refill.)   Preferred Pharmacy (with phone number or street name): [facility], buspirone needs to be called in to [place] for 10 day supply.    Agent: Please be advised that RX refills may take up to 3 business days. We ask that you follow-up with your pharmacy.

## 2017-11-08 NOTE — Telephone Encounter (Signed)
Spoke with [name].  She states she only needs the refills sent to [place] for #90 day supply.  [facility] has already taken care of the other.   [facility] as requested.

## 2018-01-01 ENCOUNTER — Encounter: Payer: Self-pay | Admitting: Family Medicine

## 2018-01-01 DIAGNOSIS — I110 Hypertensive heart disease with heart failure: Secondary | ICD-10-CM | POA: Diagnosis not present

## 2018-01-04 ENCOUNTER — Telehealth: Payer: Self-pay

## 2018-01-04 NOTE — Telephone Encounter (Signed)
[name] [name] with care connection called; pt was admitted in Jan 2019 to care connection. Jan. pt wt was 114 lbs. Oct 2018 pt weighed 100 lbs. Jan 2019 started taking Furosemide 20mg  daily except M W F and then pt took furosemide 40 mg. Edema improved;  One wk ago pts wt was 100 lbs. On 01/01/18 BUN and creatinine was slightly elevated and the furosemide was cut back to 20 mg daily. On 01/01/18 pts wt was 98 lbs. On 01/04/18 pts wt 97.8 lbs. And BP was 80/58; BP did not drop when standing. Pt feels good; no dizziness or SOB. pts usual BP is 96/65; Tammy will call pt on 03/*22/19 to ck on her and will see pt 03.27/19. Tammy is going to fax Dr Lorelei Pont the 01/01/18 labs with a note. FYI to Dr Lorelei Pont and if any new orders call Tammy at [phone] or [phone].

## 2018-01-08 NOTE — Telephone Encounter (Signed)
ok 

## 2018-01-25 ENCOUNTER — Other Ambulatory Visit: Payer: Self-pay | Admitting: Family Medicine

## 2018-01-29 DIAGNOSIS — I110 Hypertensive heart disease with heart failure: Secondary | ICD-10-CM | POA: Diagnosis not present

## 2018-01-29 DIAGNOSIS — I10 Essential (primary) hypertension: Secondary | ICD-10-CM | POA: Diagnosis not present

## 2018-02-07 ENCOUNTER — Other Ambulatory Visit: Payer: Self-pay | Admitting: Family Medicine

## 2018-02-07 NOTE — Telephone Encounter (Signed)
Last office visit 09/04/2017.  Not on current medication list.  Refill?

## 2018-02-09 ENCOUNTER — Telehealth: Payer: Self-pay | Admitting: *Deleted

## 2018-02-09 NOTE — Telephone Encounter (Signed)
Received fax from [facility] requesting PA for [place].  PA completed on CoverMyMeds.  Status: Approved, Coverage Starts on: 02/09/2018 12:00:00 AM, Coverage Ends on: 02/09/2019.

## 2018-03-01 ENCOUNTER — Telehealth: Payer: Self-pay | Admitting: Family Medicine

## 2018-03-01 NOTE — Telephone Encounter (Signed)
Copied from [place] [phone]. Topic: Quick Communication - See Telephone Encounter >> Mar 01, 2018 11:14 AM [place] Nail, NT wrote: CRM for notification. See Telephone encounter for: 03/01/18. Tammy from [facility] calling and states that the patient has been on home based palliative care since January and had some slow decline. The Medical director at [facility] thinks she is appropriate for hospice. The patient and the family are requesting this as well. Can an order/referral for hospice be placed? Also, Would Dr Lorelei Pont like to be attending physician or would he like for the medical staff at Hospice to do that? CB#: [phone]

## 2018-03-03 NOTE — Telephone Encounter (Signed)
I am very familiar with her.   I would approve of hospice order with [name] [name].   In this case, I think it may be better for Hospice physicians to be the attending physician.   I know that I will be out of town a lot throughout the whole summer, and hospice matters need to be handled promptly.   Wish her and her family my best.

## 2018-03-05 NOTE — Telephone Encounter (Signed)
Tammy with Hospice notified as instructed by telephone.  She states they went out on Friday and admitted patient to hospice care.

## 2018-03-30 ENCOUNTER — Telehealth: Payer: Self-pay

## 2018-03-30 NOTE — Telephone Encounter (Signed)
Copied from [place] [phone]. Topic: Inquiry >> Mar 30, 2018  1:25 PM Oliver Pila B wrote: Reason for CRM: pt has passed away

## 2018-04-10 ENCOUNTER — Telehealth: Payer: Self-pay | Admitting: *Deleted

## 2018-04-10 NOTE — Telephone Encounter (Signed)
Copied from [place] [phone]. Topic: Inquiry >> Mar 30, 2018  1:25 PM Oliver Pila B wrote: Reason for CRM: pt has passed away  >> 04-12-2018 11:25 AM Boyd Kerbs wrote: Daughter Dorian Pod wanted to tell [name] and Dr. Lorelei Pont that she wanted to Thank them both as they were so sweet and helpful.  It was appreciated

## 2018-04-10 NOTE — Telephone Encounter (Signed)
She was very well known, and I enjoyed knowing her.

## 2018-04-16 DEATH — deceased

## 2018-11-17 IMAGING — CR DG CHEST 2V
2 series · 2 of 2 positions shown · non-contrast
Comparison: 01/15/2016

CLINICAL DATA: LEFT side weakness, confusion

EXAM:
CHEST  2 VIEW

[chest lat]
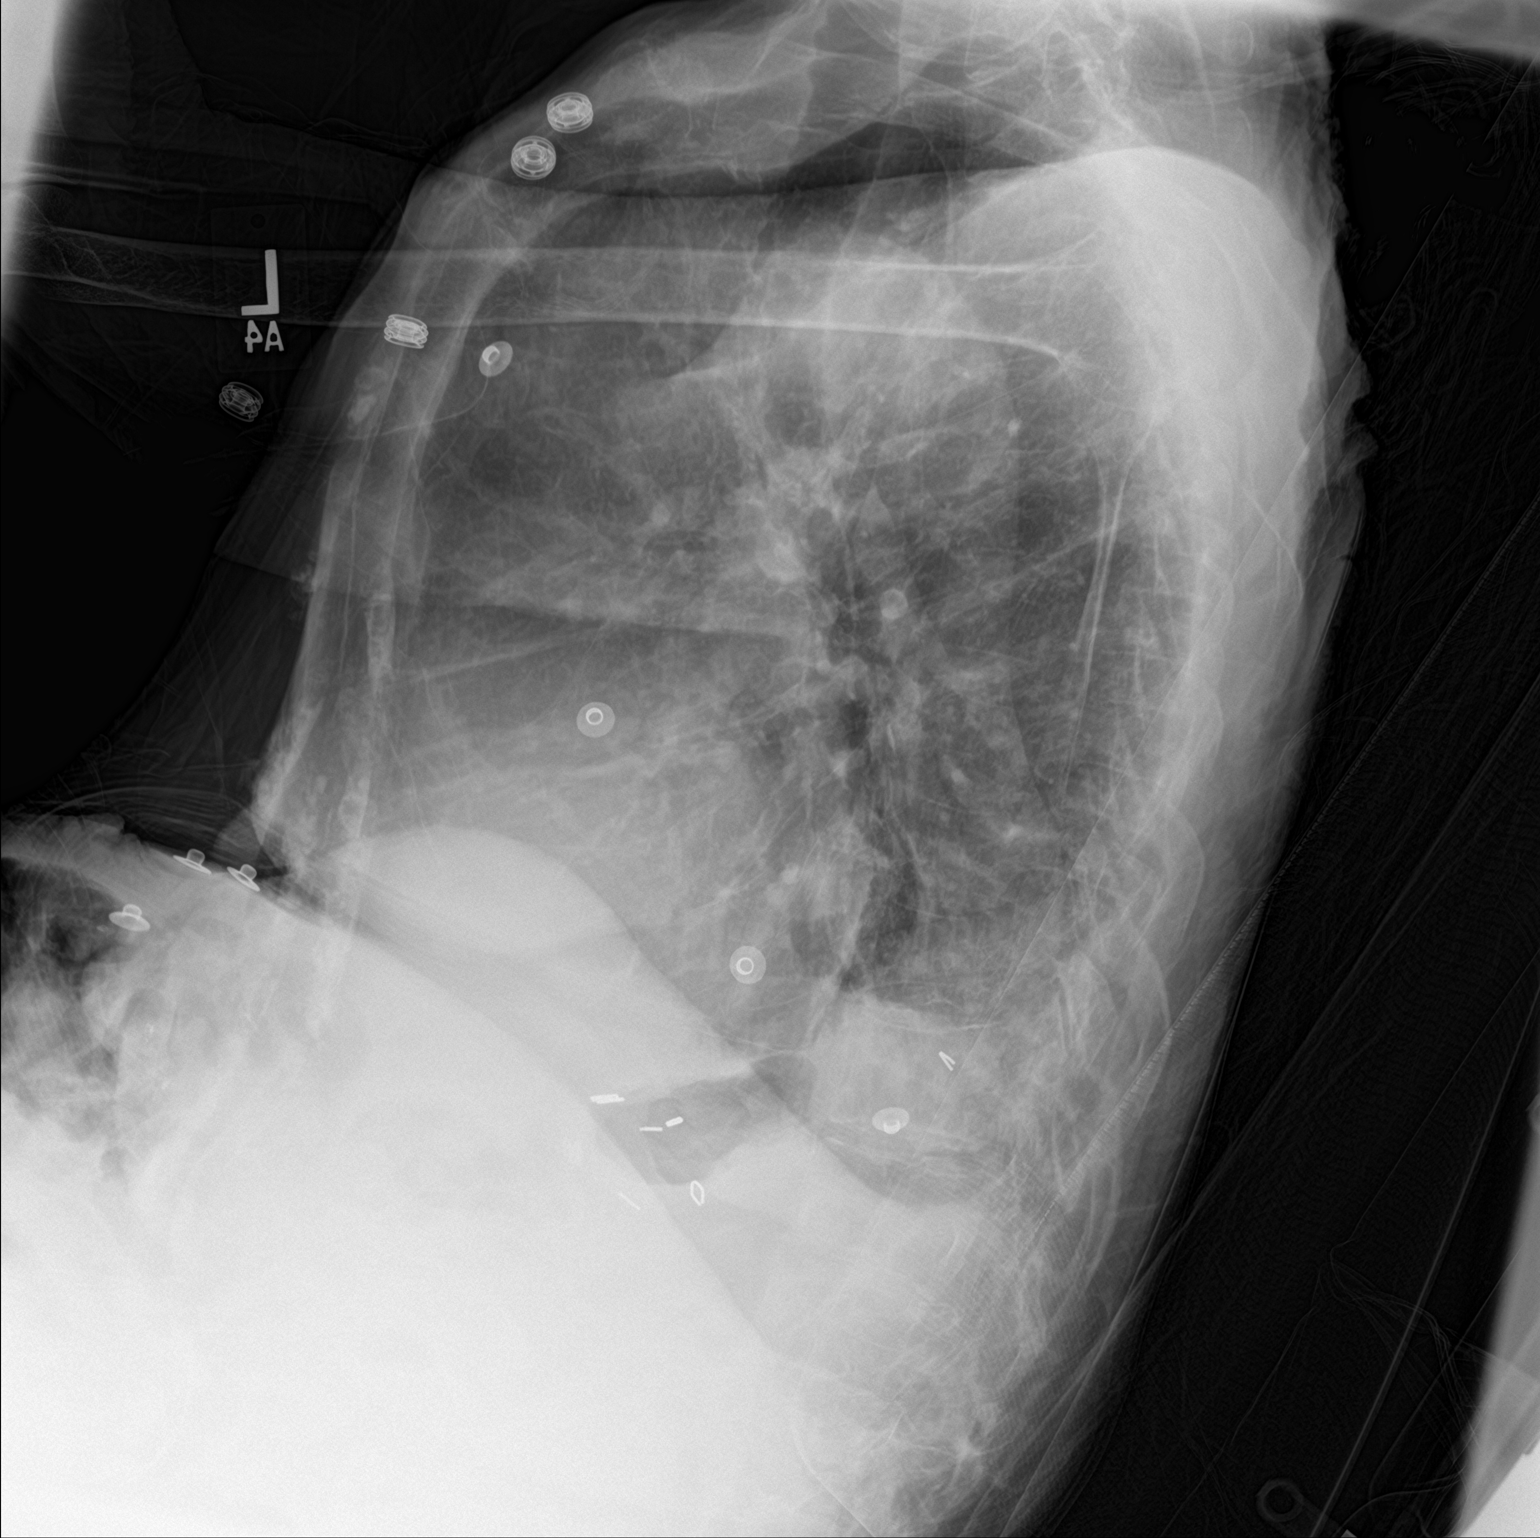

[chest ap]
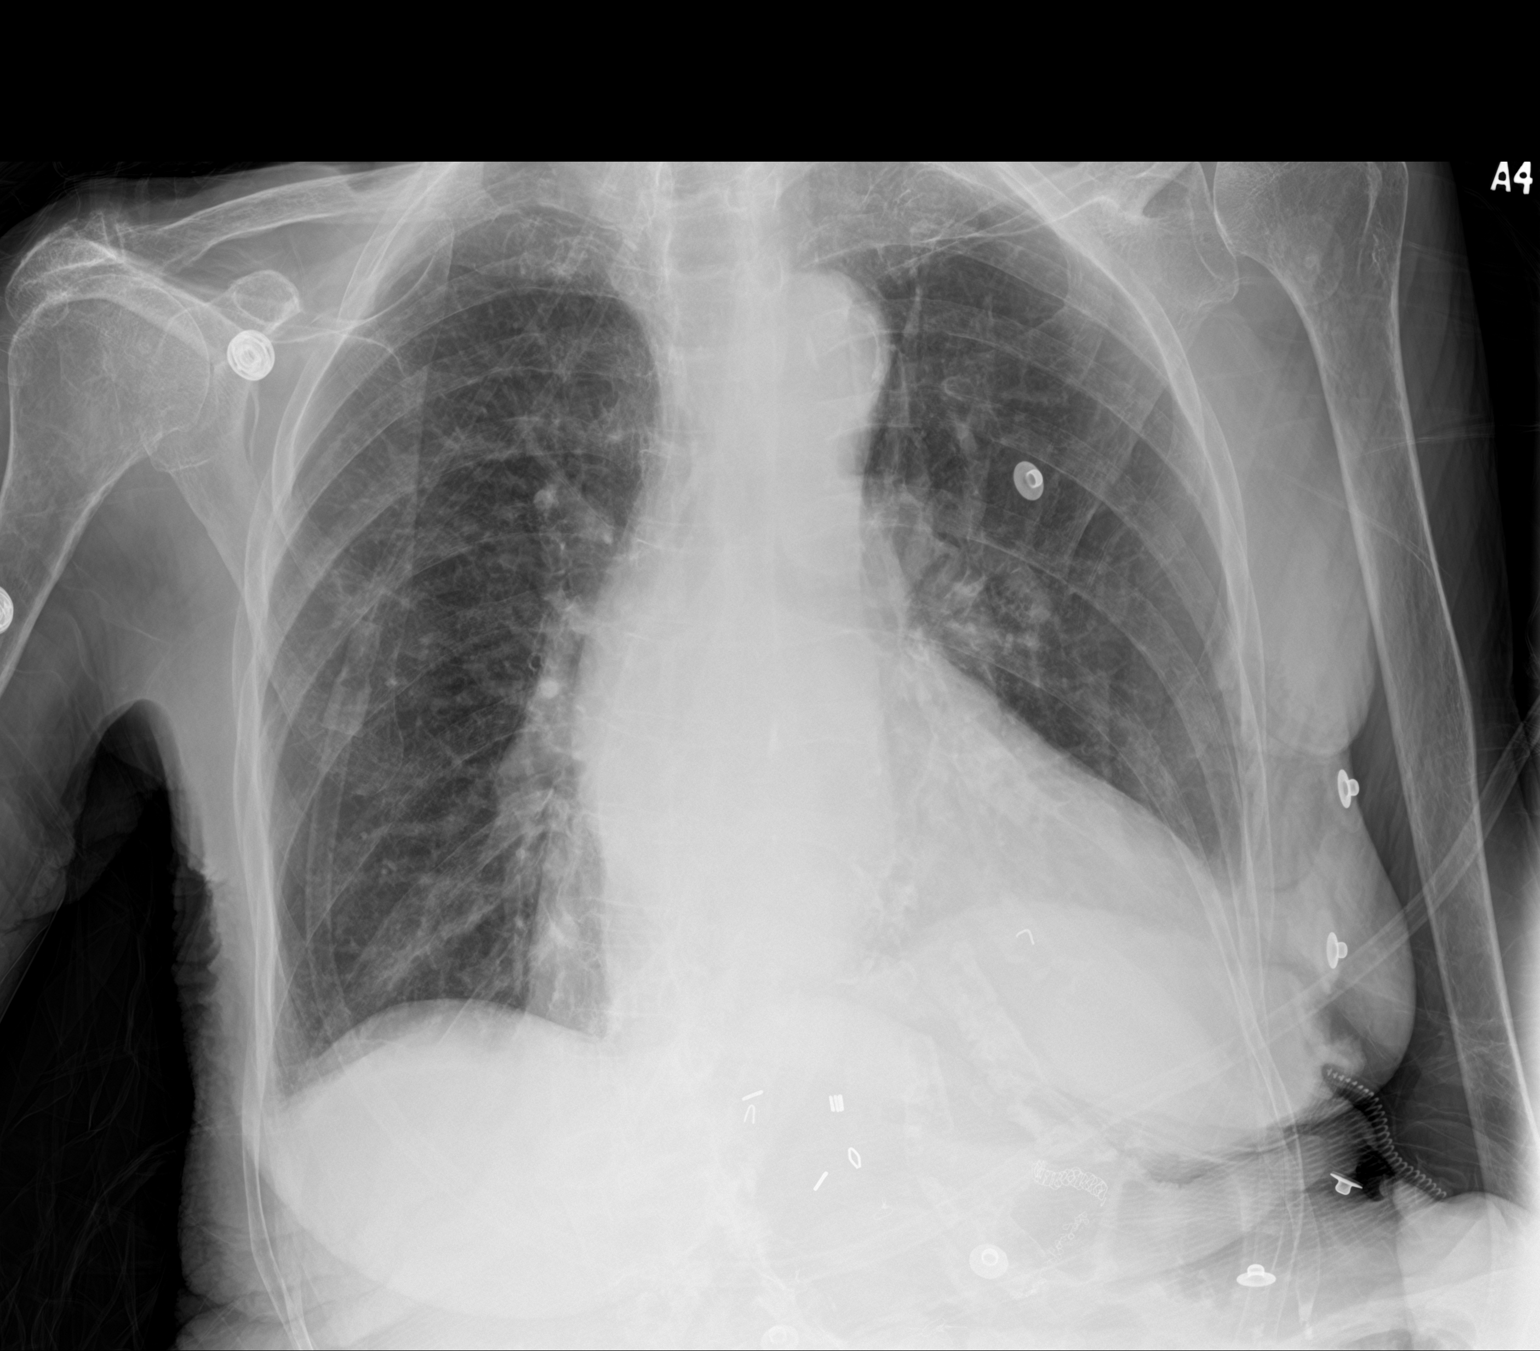

[2 of 2 positions shown; findings below may reference images not displayed]

FINDINGS: Enlargement of cardiac silhouette.

Mediastinal contours and pulmonary vascularity normal.

Atherosclerotic calcification aorta.

Emphysematous and minimal bronchitic changes question COPD.

No acute infiltrate, pleural effusion, or pneumothorax.

Marked osseous demineralization.
IMPRESSION: Enlargement of cardiac silhouette.

Question COPD changes without acute infiltrate.

Aortic atherosclerosis.
